# Patient Record
Sex: Female | Born: 1950 | Race: White | Hispanic: No | Marital: Married | Smoking: Never smoker
Health system: Southern US, Community
[De-identification: ages and names within clinical notes are randomized; demographics above are authoritative.]

## PROBLEM LIST (undated history)

## (undated) DIAGNOSIS — F988 Other specified behavioral and emotional disorders with onset usually occurring in childhood and adolescence: Secondary | ICD-10-CM

## (undated) DIAGNOSIS — A6920 Lyme disease, unspecified: Secondary | ICD-10-CM

## (undated) DIAGNOSIS — F329 Major depressive disorder, single episode, unspecified: Secondary | ICD-10-CM

## (undated) DIAGNOSIS — K219 Gastro-esophageal reflux disease without esophagitis: Secondary | ICD-10-CM

## (undated) DIAGNOSIS — T7840XA Allergy, unspecified, initial encounter: Secondary | ICD-10-CM

## (undated) DIAGNOSIS — F32A Depression, unspecified: Secondary | ICD-10-CM

## (undated) DIAGNOSIS — E213 Hyperparathyroidism, unspecified: Secondary | ICD-10-CM

## (undated) HISTORY — PX: EYE SURGERY: SHX253

## (undated) HISTORY — DX: Major depressive disorder, single episode, unspecified: F32.9

## (undated) HISTORY — PX: BREAST BIOPSY: SHX20

## (undated) HISTORY — DX: Gastro-esophageal reflux disease without esophagitis: K21.9

## (undated) HISTORY — DX: Allergy, unspecified, initial encounter: T78.40XA

## (undated) HISTORY — DX: Depression, unspecified: F32.A

## (undated) HISTORY — DX: Other specified behavioral and emotional disorders with onset usually occurring in childhood and adolescence: F98.8

## (undated) HISTORY — DX: Lyme disease, unspecified: A69.20

---

## 1976-11-10 HISTORY — PX: TUBAL LIGATION: SHX77

## 1978-11-10 HISTORY — PX: WISDOM TOOTH EXTRACTION: SHX21

## 2004-10-24 ENCOUNTER — Ambulatory Visit: Payer: Self-pay | Admitting: General Surgery

## 2005-03-06 ENCOUNTER — Ambulatory Visit: Payer: Self-pay

## 2005-11-20 ENCOUNTER — Ambulatory Visit: Payer: Self-pay | Admitting: Internal Medicine

## 2005-11-27 ENCOUNTER — Ambulatory Visit: Payer: Self-pay | Admitting: General Surgery

## 2006-09-16 ENCOUNTER — Ambulatory Visit: Payer: Self-pay | Admitting: Internal Medicine

## 2007-04-27 ENCOUNTER — Ambulatory Visit: Payer: Self-pay | Admitting: Unknown Physician Specialty

## 2007-05-27 ENCOUNTER — Ambulatory Visit: Payer: Self-pay | Admitting: Unknown Physician Specialty

## 2007-08-05 DIAGNOSIS — C4491 Basal cell carcinoma of skin, unspecified: Secondary | ICD-10-CM

## 2007-08-05 HISTORY — DX: Basal cell carcinoma of skin, unspecified: C44.91

## 2007-12-06 ENCOUNTER — Ambulatory Visit: Payer: Self-pay | Admitting: Internal Medicine

## 2008-12-12 ENCOUNTER — Ambulatory Visit: Payer: Self-pay | Admitting: Internal Medicine

## 2010-01-02 ENCOUNTER — Ambulatory Visit: Payer: Self-pay | Admitting: Internal Medicine

## 2010-11-28 ENCOUNTER — Ambulatory Visit: Payer: Self-pay | Admitting: Internal Medicine

## 2010-12-13 ENCOUNTER — Ambulatory Visit: Payer: Self-pay | Admitting: Internal Medicine

## 2011-01-06 ENCOUNTER — Ambulatory Visit: Payer: Self-pay | Admitting: Internal Medicine

## 2011-01-08 ENCOUNTER — Ambulatory Visit: Payer: Self-pay | Admitting: Internal Medicine

## 2011-01-21 ENCOUNTER — Other Ambulatory Visit: Payer: Self-pay | Admitting: General Surgery

## 2011-01-21 DIAGNOSIS — R921 Mammographic calcification found on diagnostic imaging of breast: Secondary | ICD-10-CM

## 2011-01-27 ENCOUNTER — Ambulatory Visit: Payer: Self-pay | Admitting: Surgery

## 2011-01-29 LAB — PATHOLOGY REPORT

## 2011-11-15 LAB — HM PAP SMEAR

## 2012-01-13 ENCOUNTER — Ambulatory Visit: Payer: Self-pay | Admitting: Internal Medicine

## 2012-01-16 ENCOUNTER — Ambulatory Visit: Payer: Self-pay | Admitting: Internal Medicine

## 2012-09-03 ENCOUNTER — Telehealth: Payer: Self-pay | Admitting: Internal Medicine

## 2012-09-03 NOTE — Telephone Encounter (Signed)
Dr. Lorin Picket called this into pharmacy with 2 refills.

## 2012-09-03 NOTE — Telephone Encounter (Signed)
Refill request for venlafaxine hcl er 37.5 mg cap Sig: take one capsule by mouth once a day Patient has an appointment on 12/16/11

## 2012-12-15 ENCOUNTER — Encounter: Payer: Self-pay | Admitting: Internal Medicine

## 2012-12-15 ENCOUNTER — Ambulatory Visit (INDEPENDENT_AMBULATORY_CARE_PROVIDER_SITE_OTHER): Admitting: Internal Medicine

## 2012-12-15 ENCOUNTER — Other Ambulatory Visit (HOSPITAL_COMMUNITY)
Admission: RE | Admit: 2012-12-15 | Discharge: 2012-12-15 | Disposition: A | Discharge status: Home / Self Care | Source: Ambulatory Visit | Attending: Internal Medicine | Admitting: Internal Medicine

## 2012-12-15 VITALS — BP 110/60 | HR 73 | Temp 98.2°F | Ht 67.5 in | Wt 164.5 lb

## 2012-12-15 DIAGNOSIS — G9389 Other specified disorders of brain: Secondary | ICD-10-CM

## 2012-12-15 DIAGNOSIS — F329 Major depressive disorder, single episode, unspecified: Secondary | ICD-10-CM

## 2012-12-15 DIAGNOSIS — Z139 Encounter for screening, unspecified: Secondary | ICD-10-CM

## 2012-12-15 DIAGNOSIS — Z1151 Encounter for screening for human papillomavirus (HPV): Secondary | ICD-10-CM | POA: Insufficient documentation

## 2012-12-15 DIAGNOSIS — E7800 Pure hypercholesterolemia, unspecified: Secondary | ICD-10-CM

## 2012-12-15 DIAGNOSIS — G4730 Sleep apnea, unspecified: Secondary | ICD-10-CM

## 2012-12-15 DIAGNOSIS — R319 Hematuria, unspecified: Secondary | ICD-10-CM

## 2012-12-15 DIAGNOSIS — G939 Disorder of brain, unspecified: Secondary | ICD-10-CM

## 2012-12-15 DIAGNOSIS — Z01419 Encounter for gynecological examination (general) (routine) without abnormal findings: Secondary | ICD-10-CM | POA: Insufficient documentation

## 2012-12-15 DIAGNOSIS — R5383 Other fatigue: Secondary | ICD-10-CM

## 2012-12-15 DIAGNOSIS — F320 Major depressive disorder, single episode, mild: Secondary | ICD-10-CM | POA: Insufficient documentation

## 2012-12-15 MED ORDER — OMEPRAZOLE 20 MG PO CPDR: 20.0000 mg | DELAYED_RELEASE_CAPSULE | Freq: Every day | ORAL | Status: DC

## 2012-12-15 MED ORDER — VENLAFAXINE HCL ER 37.5 MG PO CP24: 37.5000 mg | ORAL_CAPSULE | Freq: Every day | ORAL | Status: DC

## 2012-12-15 NOTE — Assessment & Plan Note (Signed)
Worked up by Dr Stoioff.  Per patient - everything checked out fine.  Last urinalysis - no rbc's (trace blood).  Follow.   

## 2012-12-15 NOTE — Assessment & Plan Note (Signed)
Back on effexor and doing well.  Follow.

## 2012-12-15 NOTE — Assessment & Plan Note (Signed)
Currently being treated.  Follow.   

## 2012-12-15 NOTE — Assessment & Plan Note (Signed)
Saw Dr Allan Friedman.  He felt the lesion was stable.  Felt like it was fat.  Currently asymptomatic.  

## 2012-12-15 NOTE — Progress Notes (Signed)
Subjective:    [name], female    DOB: [birth year], 62 y.o.   MRN: [id number]  HPI 62 year old female with past history of depression, adult ADHD and seasonal allergies who comes in today to follow up on these issues as well as for a complete physical exam.  She reports that several months ago, she developed increased joint pain and muscles felt weak.  She saw Dr Gavin Potters.  On given pain medication - which she took regularly at first.  Then she just took the medication as needed.  Now joint pain has resolved.  No muscle weakness.  She feels more like her old self.  She had been off her Effexor.  Has now restarted and feels much better mentally.  She also has been seeing dermatology for her scalp.  States she was diagnosed with psoriasis.  Better now.  Eating and drinking well.    Past Medical History  Diagnosis Date  . Depression   . Allergy   . GERD (gastroesophageal reflux disease)   . Lyme disease     history  . ADD (attention deficit disorder)   . Arthritis     Current Outpatient Prescriptions on File Prior to Visit  Medication Sig Dispense Refill  . omeprazole (PRILOSEC) 20 MG capsule Take 1 capsule (20 mg total) by mouth daily.  30 capsule  5  . venlafaxine XR (EFFEXOR-XR) 37.5 MG 24 hr capsule Take 1 capsule (37.5 mg total) by mouth daily.  30 capsule  5    Review of Systems Patient denies any headache, lightheadedness or dizziness.  No significant sinus or allergy symptoms.  No chest pain, tightness or palpitations.  No increased shortness of breath, cough or congestion.  No nausea or vomiting. No acid reflux.  On omeprazole.   No abdominal pain or cramping.  No bowel change, such as diarrhea, constipation, BRBPR or melana.  No urine change.  Some fatigue, but overall feels better being back on her Effexor.  She denies noticing any change in her breast.      Objective:   Physical Exam Filed Vitals:   12/15/12 1004  BP: 110/60  Pulse: 73  Temp: 98.2 F (12.72  C)   62 year old female in no acute distress.   HEENT:  Nares- clear.  Oropharynx - without lesions. NECK:  Supple.  Nontender.  No audible bruit.  HEART:  Appears to be regular. LUNGS:  No crackles or wheezing audible.  Respirations even and unlabored.  RADIAL PULSE:  Equal bilaterally.    BREASTS:  No nipple discharge or nipple retraction present.  Could not appreciate any distinct nodules or axillary adenopathy.  ABDOMEN:  Soft, nontender.  Bowel sounds present and normal.  No audible abdominal bruit.  GU:  Normal external genitalia.  Vaginal vault without lesions.  Cervix identified.  Pap performed. Could not appreciate any adnexal masses or tenderness.   RECTAL:  Heme negative.   EXTREMITIES:  No increased edema present.  DP pulses palpable and equal bilaterally.          Assessment & Plan:  PREVIOUS ABNORMAL MAMMOGRAM.  Saw Dr Katrinka Blazing.  Had breast biopsy - fibroadenoma.  Last mammogram 3/13 - recommended additional views.  Final reading - BiRADS II.  Schedule a follow up breast exam.    CARDIOVASCULAR.  ECHO 06/2008 revealed normal LV function with EF 55%.  No significant valvular abnormality.  Asymptomatic.    HISTORY OF OVARIAN CYST.  Evaluated by Dr Luella Cook and released.  MSK.  No joint pains currently.   Saw Dr Gavin Potters.  Follow.    FATIGUE.  Check cbc, met c and ts.   HEALTH MAINTENANCE.  Physical today.  Schedule mammogram.  Pap today.  Colonoscopy 02/2005 - normal.  Recommended follow up colonoscopy 2016.  IFOB today.

## 2012-12-21 ENCOUNTER — Encounter: Payer: Self-pay | Admitting: *Deleted

## 2012-12-30 ENCOUNTER — Encounter: Payer: Self-pay | Admitting: Internal Medicine

## 2013-01-06 ENCOUNTER — Telehealth: Payer: Self-pay | Admitting: Internal Medicine

## 2013-01-06 MED ORDER — OMEPRAZOLE 20 MG PO CPDR: 20.0000 mg | DELAYED_RELEASE_CAPSULE | Freq: Every day | ORAL | Status: DC

## 2013-01-06 MED ORDER — VENLAFAXINE HCL ER 37.5 MG PO CP24: 37.5000 mg | ORAL_CAPSULE | Freq: Every day | ORAL | Status: DC

## 2013-01-06 NOTE — Telephone Encounter (Signed)
The meds by mail in our system does not have the address of [place].  I will print rx and she can send in.  If any problems let me know,

## 2013-01-07 MED ORDER — OMEPRAZOLE 20 MG PO CPDR: 20.0000 mg | DELAYED_RELEASE_CAPSULE | Freq: Every day | ORAL | Status: DC

## 2013-01-07 MED ORDER — VENLAFAXINE HCL ER 37.5 MG PO CP24: 37.5000 mg | ORAL_CAPSULE | Freq: Every day | ORAL | Status: DC

## 2013-01-07 NOTE — Telephone Encounter (Signed)
Sent in to pharmacy. Checked with patient this is the right pharmacy.

## 2013-01-07 NOTE — Telephone Encounter (Signed)
Patient calling waiting on her prescription . Do you have the prescription ready for the patient or will you fax them to her mail order?

## 2013-01-25 ENCOUNTER — Ambulatory Visit: Payer: Self-pay | Admitting: Internal Medicine

## 2013-01-29 ENCOUNTER — Encounter: Payer: Self-pay | Admitting: Internal Medicine

## 2013-02-15 ENCOUNTER — Encounter: Payer: Self-pay | Admitting: Internal Medicine

## 2013-06-15 ENCOUNTER — Ambulatory Visit: Admitting: Internal Medicine

## 2013-07-01 ENCOUNTER — Ambulatory Visit: Admitting: Internal Medicine

## 2013-07-25 ENCOUNTER — Encounter: Payer: Self-pay | Admitting: Adult Health

## 2013-07-25 ENCOUNTER — Ambulatory Visit (INDEPENDENT_AMBULATORY_CARE_PROVIDER_SITE_OTHER): Admitting: Adult Health

## 2013-07-25 VITALS — BP 102/62 | HR 59 | Temp 98.0°F | Resp 12 | Ht 67.5 in | Wt 164.0 lb

## 2013-07-25 DIAGNOSIS — M25511 Pain in right shoulder: Secondary | ICD-10-CM

## 2013-07-25 DIAGNOSIS — N644 Mastodynia: Secondary | ICD-10-CM | POA: Insufficient documentation

## 2013-07-25 DIAGNOSIS — M25519 Pain in unspecified shoulder: Secondary | ICD-10-CM

## 2013-07-25 MED ORDER — NAPROXEN 500 MG PO TABS: 500.0000 mg | ORAL_TABLET | Freq: Two times a day (BID) | ORAL | Status: DC

## 2013-07-25 NOTE — Patient Instructions (Addendum)
  Start Naproxen 500 mg twice a day with meals.  Ice for 15 minutes and do this 3-4 times daily.  You can try aspercream to see if this help with the pain.  Immobilize with a sling. You can get one at [facility] that is inexpensive.   I am referring you to an orthopedic for further evaluation of your shoulder.

## 2013-07-25 NOTE — Progress Notes (Signed)
  Subjective:    [name], female    DOB: 1951-03-27, 62 y.o.   MRN: [id number]  HPI  Patient is a pleasant 62 year old female who presents to clinic with complaints of Right shoulder soreness. Symptoms began after using the trimming scissors in her yard during the spring. She reports pain ongoing for 6 months. She has tried advil as needed, massage. She has not been consistent with any NSAIDs. She has not iced the area or immobilized it. Pain is worse with abduction of the arm. Occasionally feels pain down her bicep into elbow.    Current Outpatient Prescriptions on File Prior to Visit  Medication Sig Dispense Refill  . omeprazole (PRILOSEC) 20 MG capsule Take 1 capsule (20 mg total) by mouth daily.  30 capsule  5  . venlafaxine XR (EFFEXOR-XR) 37.5 MG 24 hr capsule Take 1 capsule (37.5 mg total) by mouth daily.  30 capsule  5   No current facility-administered medications on file prior to visit.    Review of Systems  Respiratory: Negative.   Cardiovascular: Negative.   Musculoskeletal:       Right shoulder pain especially with raising arm to the side. Radiates to her elbow  Psychiatric/Behavioral: Negative.       BP 102/62  Pulse 59  Temp(Src) 98 F (36.7 C) (Oral)  Resp 12  Ht 5' 7.5" (1.715 m)  Wt 164 lb (74.39 kg)  BMI 25.29 kg/m2  SpO2 98%  LMP 07/16/1995     Objective:   Physical Exam  Constitutional: She is oriented to person, place, and time.  Cardiovascular: Normal rate and regular rhythm.   Pulmonary/Chest: Effort normal. No respiratory distress.  Musculoskeletal:  ROM decreased. Pain with abduction at ~ 20-30 degree. Pain radiates to elbow with abduction.  Neurological: She is alert and oriented to person, place, and time.  Skin: Skin is warm and dry.  Psychiatric: She has a normal mood and affect. Her behavior is normal. Judgment and thought content normal.          Assessment & Plan:

## 2013-07-25 NOTE — Assessment & Plan Note (Addendum)
Shoulder pain with radiating to elbow with abduction of arms. Suspect tendonitis. Start Naproxen. Ice area for 15 min.  Immobilize with sling. Aspercream to see if helps with discomfort. Will have ortho see her to evaluate if steroid injection may benefit.

## 2013-07-27 ENCOUNTER — Ambulatory Visit (INDEPENDENT_AMBULATORY_CARE_PROVIDER_SITE_OTHER): Admitting: Family Medicine

## 2013-07-27 ENCOUNTER — Encounter: Payer: Self-pay | Admitting: Family Medicine

## 2013-07-27 VITALS — BP 100/60 | HR 69 | Temp 98.2°F | Ht 67.5 in | Wt 166.5 lb

## 2013-07-27 DIAGNOSIS — M25519 Pain in unspecified shoulder: Secondary | ICD-10-CM

## 2013-07-27 DIAGNOSIS — M25511 Pain in right shoulder: Secondary | ICD-10-CM

## 2013-07-27 DIAGNOSIS — M7501 Adhesive capsulitis of right shoulder: Secondary | ICD-10-CM

## 2013-07-27 DIAGNOSIS — M7500 Adhesive capsulitis of unspecified shoulder: Secondary | ICD-10-CM

## 2013-07-27 NOTE — Progress Notes (Signed)
Date:  07/27/2013   Name:  Ann Osborne   DOB:  June 05, 1951   MRN:  [id number] Gender: female Age: 62 y.o.  Primary Physician: Charm Barges, MD Referring Provider: Ms. Hassie Bruce  Dear Ms. Rey,  Thank you for having me see Ann Osborne in consultation today at [employer] [facility] at [facility] for her problem with RIGHT sided shoulder pain.  As you may recall, she is a 62 y.o. year old female with a 6 month history of R sided shoulder pain after she was trimming bushes at home. At that time, she was trimming bushes overhead and has had some pain ever since.   She has intermittently been taking some Alleve for the pain. No significant history of R shoulder injury in the past, fractures, neck injuries, or prior surgery.  She now has a dull ache in the shoulder and in a T-shirt distribution with limitation in her motion.    Past Medical History  Diagnosis Date  . Depression   . Allergy   . GERD (gastroesophageal reflux disease)   . Lyme disease     history  . ADD (attention deficit disorder)   . Arthritis     Past Surgical History  Procedure Laterality Date  . Tubal ligation  1978    bilateral  . Wisdom tooth extraction  1980  . Breast biopsy  20 yrs ago    History   Social History  . Marital Status: Unknown    Spouse Name: N/A    Number of Children: 2  . Years of Education: N/A   Social History Main Topics  . Smoking status: Never Smoker   . Smokeless tobacco: Never Used  . Alcohol Use: Yes     Comment: occasional  . Drug Use: No  . Sexual Activity: None   Other Topics Concern  . None   Social History Narrative  . None    Family History  Problem Relation Age of Onset  . Uterine cancer Mother   . Heart disease Maternal Grandmother   . Alcohol abuse Maternal Grandfather   . Parkinson's disease Paternal Grandmother   . Depression Paternal Grandfather     Medications and Allergies reviewed  Outpatient Prescriptions Prior to Visit  Medication Sig  Dispense Refill  . naproxen (NAPROSYN) 500 MG tablet Take 1 tablet (500 mg total) by mouth 2 (two) times daily with a meal.  30 tablet  1  . omeprazole (PRILOSEC) 20 MG capsule Take 1 capsule (20 mg total) by mouth daily.  30 capsule  5  . venlafaxine XR (EFFEXOR-XR) 37.5 MG 24 hr capsule Take 1 capsule (37.5 mg total) by mouth daily.  30 capsule  5   No facility-administered medications prior to visit.    Review of Systems:    GEN: No fevers, chills. Nontoxic. Primarily MSK c/o today. MSK: Detailed in the HPI GI: tolerating PO intake without difficulty Neuro: No numbness, parasthesias, or tingling associated. Otherwise the pertinent positives of the ROS are noted above.    Physical Examination: Filed Vitals:   07/27/13 1020  BP: 100/60  Pulse: 69  Temp: 98.2 F (36.8 C)  TempSrc: Oral  Height: 5' 7.5" (1.715 m)  Weight: 166 lb 8 oz (75.524 kg)      GEN: Well-developed,well-nourished,in no acute distress; alert,appropriate and cooperative throughout examination HEENT: Normocephalic and atraumatic without obvious abnormalities. Ears, externally no deformities PULM: Breathing comfortably in no respiratory distress EXT: No clubbing, cyanosis, or edema PSYCH: Normally interactive. Cooperative during the interview.  Pleasant. Friendly and conversant. Not anxious or depressed appearing. Normal, full affect.  R shoulder: mild tenderness at [facility] joint. NT along clavicle. Mildly tender in the bicipital groove. Speeds and Yergason's are not particularly bothersome.   Abduction is limited to 150 degrees.  Flexion limited to 160 degrees. In 90 degrees of abduction, relative lack of 70 deg of IROM compared to contralateral side In 90 deg abd, EROM with relative loss of 15 deg compared to contralateral side.   str 4++/5 in abd All others 5/5  Other special testing equivocal with restricted motion  C5-T1 intact  Neuro: Sensation intact Grip 5/5   Assessment and Plan:  Impression:  1. 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 Most likely with partial thickness RTC injury 6 months ago. Strength and mechanics are fairly good, full-thickness RTC tear unlikely but could not be completely excluded.   Recommendations: We discussed various treatment options, and I think most of her pain from frozen shoulder. Begin aggressive PT and HEP for frozen shoulder.   Orders Placed This Encounter  Procedures  . Ambulatory referral to Physical Therapy    Referral Priority:  Routine    Referral Type:  Physical Medicine    Referral Reason:  Specialty Services Required    Requested Specialty:  Physical Therapy    Number of Visits Requested:  1   Intrarticular Shoulder Injection, RIGHT Verbal consent was obtained from the patient. Risks including infection explained and contrasted with benefits and alternatives. Patient prepped with Chloraprep and Ethyl Chloride used for anesthesia. An intraarticular shoulder injection was performed using the posterior approach in combination with subac inj with 50% of volume placed in each space. The patient tolerated the procedure well and had decreased pain post injection. No complications. Injection: 8 cc of Lidocaine 1% and 2 cc of Depo-Medrol 40 mg. Needle: 22 gauge   We will see the patient back in 6 weeks.  Thank you for having [place] see Ann Osborne in consultation.  Feel free to contact me with any questions.  Signed, Elpidio Galea. Annahi Short, MD, CAQ Sports Medicine [employer] at [facility] [address] [place], [place] [address] Phone: [phone] Fax: [phone]

## 2013-07-27 NOTE — Patient Instructions (Addendum)
REFERRAL: GO THE THE FRONT ROOM AT THE ENTRANCE OF OUR CLINIC, NEAR CHECK IN. ASK FOR MARION. SHE WILL HELP YOU SET UP YOUR REFERRAL. DATE: TIME:   F/u 6 weeks

## 2013-08-09 ENCOUNTER — Other Ambulatory Visit (INDEPENDENT_AMBULATORY_CARE_PROVIDER_SITE_OTHER)

## 2013-08-09 DIAGNOSIS — Z1211 Encounter for screening for malignant neoplasm of colon: Secondary | ICD-10-CM

## 2013-08-09 DIAGNOSIS — Z139 Encounter for screening, unspecified: Secondary | ICD-10-CM

## 2013-08-11 ENCOUNTER — Encounter: Payer: Self-pay | Admitting: Internal Medicine

## 2013-08-11 ENCOUNTER — Ambulatory Visit: Admitting: Internal Medicine

## 2013-08-25 ENCOUNTER — Ambulatory Visit (INDEPENDENT_AMBULATORY_CARE_PROVIDER_SITE_OTHER): Admitting: Internal Medicine

## 2013-08-25 ENCOUNTER — Encounter: Payer: Self-pay | Admitting: Internal Medicine

## 2013-08-25 VITALS — BP 110/60 | HR 75 | Temp 97.7°F | Wt 163.5 lb

## 2013-08-25 DIAGNOSIS — M25519 Pain in unspecified shoulder: Secondary | ICD-10-CM

## 2013-08-25 DIAGNOSIS — G939 Disorder of brain, unspecified: Secondary | ICD-10-CM

## 2013-08-25 DIAGNOSIS — G9389 Other specified disorders of brain: Secondary | ICD-10-CM

## 2013-08-25 DIAGNOSIS — G4730 Sleep apnea, unspecified: Secondary | ICD-10-CM

## 2013-08-25 DIAGNOSIS — M25511 Pain in right shoulder: Secondary | ICD-10-CM

## 2013-08-25 DIAGNOSIS — Z9109 Other allergy status, other than to drugs and biological substances: Secondary | ICD-10-CM

## 2013-08-25 DIAGNOSIS — Z23 Encounter for immunization: Secondary | ICD-10-CM

## 2013-08-25 DIAGNOSIS — F329 Major depressive disorder, single episode, unspecified: Secondary | ICD-10-CM

## 2013-08-25 DIAGNOSIS — R319 Hematuria, unspecified: Secondary | ICD-10-CM

## 2013-08-25 MED ORDER — FLUTICASONE PROPIONATE 50 MCG/ACT NA SUSP: 2.0000 | Freq: Every day | NASAL | Status: DC

## 2013-08-25 MED ORDER — OMEPRAZOLE 20 MG PO CPDR: 20.0000 mg | DELAYED_RELEASE_CAPSULE | Freq: Every day | ORAL | Status: DC

## 2013-08-25 MED ORDER — VENLAFAXINE HCL ER 37.5 MG PO CP24: 37.5000 mg | ORAL_CAPSULE | Freq: Every day | ORAL | Status: DC

## 2013-08-25 NOTE — Progress Notes (Signed)
  Subjective:    [name], female    DOB: 07-20-1951, 62 y.o.   MRN: [id number]  HPI 62 year old female with past history of depression, adult ADHD and seasonal allergies who comes in today for a scheduled follow up.  She previously stopped her Effexor.  Did not feel well off.  Back on now and feels good.  She also has been seeing dermatology for her scalp.  States she was diagnosed with psoriasis.  Better now.  Eating and drinking well.  Is s/p right shoulder injection.  Helped.  States she is having constant drainage and cough. No fever.  Request a referral to an allergist.     Past Medical History  Diagnosis Date  . Depression   . Allergy   . GERD (gastroesophageal reflux disease)   . Lyme disease     history  . ADD (attention deficit disorder)   . Arthritis     Current Outpatient Prescriptions on File Prior to Visit  Medication Sig Dispense Refill  . omeprazole (PRILOSEC) 20 MG capsule Take 1 capsule (20 mg total) by mouth daily.  30 capsule  5  . venlafaxine XR (EFFEXOR-XR) 37.5 MG 24 hr capsule Take 1 capsule (37.5 mg total) by mouth daily.  30 capsule  5   No current facility-administered medications on file prior to visit.    Review of Systems Patient denies any headache, lightheadedness or dizziness.  Persistent drainage and cough as outlined.  No chest pain, tightness or palpitations.  No increased shortness of breath or congestion.  No nausea or vomiting. No acid reflux.  On omeprazole.   No abdominal pain or cramping.  No bowel change, such as diarrhea, constipation, BRBPR or melana.  No urine change.  Feels good.  Taking her effexor.  In a new relationship.      Objective:   Physical Exam  Filed Vitals:   08/25/13 1143  BP: 110/60  Pulse: 75  Temp: 97.7 F (69.73 C)   62 year old female in no acute distress.   HEENT:  Nares- clear.  Oropharynx - without lesions. NECK:  Supple.  Nontender.  No audible bruit.  HEART:  Appears to be regular. LUNGS:   No crackles or wheezing audible.  Respirations even and unlabored.  RADIAL PULSE:  Equal bilaterally.    ABDOMEN:  Soft, nontender.  Bowel sounds present and normal.  No audible abdominal bruit.   EXTREMITIES:  No increased edema present.  DP pulses palpable and equal bilaterally.          Assessment & Plan:  PREVIOUS ABNORMAL MAMMOGRAM.  Saw Dr Katrinka Blazing.  Had breast biopsy - fibroadenoma.  Mammogram 3/13 - recommended additional views.  Final reading - BiRADS II.  01/25/13 mammogram - Birads I.     CARDIOVASCULAR.  ECHO 06/2008 revealed normal LV function with EF 55%.  No significant valvular abnormality.  Asymptomatic.    HISTORY OF OVARIAN CYST.  Evaluated by Dr Luella Cook and released.    MSK.  No joint pains currently.   Saw Dr Gavin Potters.  S/p shoulder injection.  Helped.      HEALTH MAINTENANCE.  Physical 12/15/12.  Mammogram 01/25/13 - birads I.   Pap at physical negative with negative HPV.  Colonoscopy 02/2005 - normal.  Recommended follow up colonoscopy 2016.   IFOB negative 08/09/13.

## 2013-08-29 ENCOUNTER — Encounter: Payer: Self-pay | Admitting: Internal Medicine

## 2013-08-29 NOTE — Assessment & Plan Note (Signed)
Currently being treated.  Follow.   

## 2013-08-29 NOTE — Assessment & Plan Note (Signed)
Back on effexor and doing well.  Follow.

## 2013-08-29 NOTE — Assessment & Plan Note (Signed)
S/p injection.  Doing well.  Follow.   

## 2013-08-29 NOTE — Assessment & Plan Note (Signed)
Saw Dr Allan Friedman.  He felt the lesion was stable.  Felt like it was fat.  Currently asymptomatic.  

## 2013-08-29 NOTE — Assessment & Plan Note (Signed)
Worked up by Dr Stoioff.  Per patient - everything checked out fine.  Last urinalysis - no rbc's (trace blood).  Follow.   

## 2013-09-05 ENCOUNTER — Other Ambulatory Visit

## 2013-09-07 ENCOUNTER — Ambulatory Visit: Admitting: Family Medicine

## 2013-09-08 ENCOUNTER — Other Ambulatory Visit

## 2013-09-13 ENCOUNTER — Other Ambulatory Visit (INDEPENDENT_AMBULATORY_CARE_PROVIDER_SITE_OTHER)

## 2013-09-13 DIAGNOSIS — R5383 Other fatigue: Secondary | ICD-10-CM

## 2013-09-13 DIAGNOSIS — R5381 Other malaise: Secondary | ICD-10-CM

## 2013-09-13 DIAGNOSIS — E7800 Pure hypercholesterolemia, unspecified: Secondary | ICD-10-CM

## 2013-09-13 LAB — CBC WITH DIFFERENTIAL/PLATELET
Basophils Absolute: 0 10*3/uL (ref 0.0–0.1)
Eosinophils Absolute: 0 10*3/uL (ref 0.0–0.7)
Eosinophils Relative: 1 % (ref 0.0–5.0)
Lymphocytes Relative: 27.4 % (ref 12.0–46.0)
Lymphs Abs: 1.3 10*3/uL (ref 0.7–4.0)
MCHC: 34.6 g/dL (ref 30.0–36.0)
MCV: 91.3 fl (ref 78.0–100.0)
Monocytes Absolute: 0.5 10*3/uL (ref 0.1–1.0)
Neutrophils Relative %: 61 % (ref 43.0–77.0)
Platelets: 267 10*3/uL (ref 150.0–400.0)
RBC: 4.09 Mil/uL (ref 3.87–5.11)
RDW: 13 % (ref 11.5–14.6)

## 2013-09-13 LAB — LIPID PANEL
Cholesterol: 225 mg/dL — ABNORMAL HIGH (ref 0–200)
Total CHOL/HDL Ratio: 5
VLDL: 15 mg/dL (ref 0.0–40.0)

## 2013-09-13 LAB — COMPREHENSIVE METABOLIC PANEL
ALT: 25 U/L (ref 0–35)
AST: 26 U/L (ref 0–37)
Albumin: 4.3 g/dL (ref 3.5–5.2)
Alkaline Phosphatase: 60 U/L (ref 39–117)
Calcium: 10.3 mg/dL (ref 8.4–10.5)
Chloride: 104 mEq/L (ref 96–112)
Creatinine, Ser: 0.8 mg/dL (ref 0.4–1.2)
Potassium: 4.3 mEq/L (ref 3.5–5.1)
Sodium: 137 mEq/L (ref 135–145)
Total Bilirubin: 0.8 mg/dL (ref 0.3–1.2)

## 2013-09-13 LAB — LDL CHOLESTEROL, DIRECT: Direct LDL: 163.5 mg/dL

## 2013-09-14 ENCOUNTER — Encounter: Payer: Self-pay | Admitting: Internal Medicine

## 2013-09-22 ENCOUNTER — Ambulatory Visit (INDEPENDENT_AMBULATORY_CARE_PROVIDER_SITE_OTHER): Admitting: Family Medicine

## 2013-09-22 ENCOUNTER — Encounter: Payer: Self-pay | Admitting: Family Medicine

## 2013-09-22 VITALS — BP 100/64 | HR 61 | Temp 97.8°F | Ht 67.5 in | Wt 164.5 lb

## 2013-09-22 DIAGNOSIS — M7501 Adhesive capsulitis of right shoulder: Secondary | ICD-10-CM

## 2013-09-22 DIAGNOSIS — M7500 Adhesive capsulitis of unspecified shoulder: Secondary | ICD-10-CM

## 2013-09-22 NOTE — Progress Notes (Signed)
Date:  09/22/2013   Name:  Ann Osborne   DOB:  Oct 06, 1951   MRN:  [id number] Gender: female Age: 62 y.o.  Primary Physician:  Charm Barges, MD   Chief Complaint: Follow-up   Subjective:   History of Present Illness:  Ann Osborne is a 62 y.o. pleasant patient who presents with the following:  Followup right frozen shoulder. The patient is doing quite well.   07/27/2013, I evaluated the patient, and thought that she had a right-sided frozen shoulder with significant limitations in multiple planes of movement. We did an intra-articular injection, and she has been doing physical therapy and has been quite diligent with her home exercise program. Now her range of motion has returned to full, she has no pain, and her strength is excellent.   Patient Active Problem List   Diagnosis Date Noted  . Right shoulder pain 07/25/2013  . Sleep apnea 12/15/2012  . Depression 12/15/2012  . 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 Hematuria 12/15/2012    Past Medical History  Diagnosis Date  . Depression   . Allergy   . GERD (gastroesophageal reflux disease)   . Lyme disease     history  . ADD (attention deficit disorder)   . Arthritis     Past Surgical History  Procedure Laterality Date  . Tubal ligation  1978    bilateral  . Wisdom tooth extraction  1980  . Breast biopsy  20 yrs ago    History   Social History  . Marital Status: Unknown    Spouse Name: N/A    Number of Children: 2  . Years of Education: N/A   Occupational History  . Not on file.   Social History Main Topics  . Smoking status: Never Smoker   . Smokeless tobacco: Never Used  . Alcohol Use: Yes     Comment: occasional  . Drug Use: No  . Sexual Activity: Not on file   Other Topics Concern  . Not on file   Social History Narrative  . No narrative on file    Family History  Problem Relation Age of Onset  . Uterine cancer Mother   . Heart disease Maternal Grandmother   . Alcohol abuse Maternal  Grandfather   . Parkinson's disease Paternal Grandmother   . Depression Paternal Grandfather     Allergies  Allergen Reactions  . Compazine [Prochlorperazine Edisylate]   . Sulfa Antibiotics     Medication list has been reviewed and updated.  Review of Systems:   GEN: No fevers, chills. Nontoxic. Primarily MSK c/o today. MSK: Detailed in the HPI GI: tolerating PO intake without difficulty Neuro: No numbness, parasthesias, or tingling associated. Otherwise the pertinent positives of the ROS are noted above.   Objective:   Physical Examination: BP 100/64  Pulse 61  Temp(Src) 97.8 F (36.6 C) (Oral)  Ht 5' 7.5" (1.715 m)  Wt 164 lb 8 oz (74.617 kg)  BMI 25.37 kg/m2  LMP 07/16/1995  Ideal Body Weight: Weight in (lb) to have BMI = 25: 161.7   GEN: WDWN, NAD, Non-toxic, Alert & Oriented x 3 HEENT: Atraumatic, Normocephalic.  Ears and Nose: No external deformity. EXTR: No clubbing/cyanosis/edema NEURO: Normal gait.  PSYCH: Normally interactive. Conversant. Not depressed or anxious appearing.  Calm demeanor.   Shoulder: R Inspection: No muscle wasting or winging Ecchymosis/edema: neg  AC joint, scapula, clavicle: NT Cervical spine: NT, full ROM Spurling's: neg Abduction: full, 5/5 Flexion: full, 5/5 IR, full, lift-off: 5/5 ER at neutral:  full, 5/5 AC crossover and compression: neg Neer: neg Hawkins: neg Drop Test: neg Empty Can: neg Supraspinatus insertion: NT Bicipital groove: NT Speed's: neg Yergason's: neg Sulcus sign: neg Scapular dyskinesis: none C5-T1 intact Sensation intact Grip 5/5   No results found.  Assessment & Plan:    Frozen shoulder syndrome, right Dramatically improved, followup when necessary. Continue with home exercise program for maintenance 2 times weekly.  Signed,  Elpidio Galea. Leiah Giannotti, MD, CAQ Sports Medicine  [facility] [address] [place] [address] Phone: [phone] Fax:  [phone]  Updated Complete Medication List:   Medication List       This list is accurate as of: 09/22/13 11:32 AM.  Always use your most recent med list.               fluticasone 50 MCG/ACT nasal spray  Commonly known as:  FLONASE  Place 2 sprays into the nose daily.     omeprazole 20 MG capsule  Commonly known as:  PRILOSEC  Take 1 capsule (20 mg total) by mouth daily.     venlafaxine XR 37.5 MG 24 hr capsule  Commonly known as:  EFFEXOR-XR  Take 1 capsule (37.5 mg total) by mouth daily.

## 2013-09-22 NOTE — Progress Notes (Signed)
Pre-visit discussion using our clinic review tool. No additional management support is needed unless otherwise documented below in the visit note.  

## 2013-12-27 ENCOUNTER — Encounter: Admitting: Internal Medicine

## 2014-01-02 ENCOUNTER — Telehealth: Payer: Self-pay | Admitting: Internal Medicine

## 2014-01-02 NOTE — Telephone Encounter (Signed)
Pt left vm to rs appt 2/17 from weather/snow for CPE.  Please advise where to schedule this pt for CPE.

## 2014-01-03 NOTE — Telephone Encounter (Signed)
You can put her in 01/30/14 at 11:45 and block the 10:15 (can make 30 minutes for [name]).  Thanks.

## 2014-01-04 NOTE — Telephone Encounter (Signed)
Sent my chart message letting pt know about appointment date and time

## 2014-01-26 DIAGNOSIS — L299 Pruritus, unspecified: Secondary | ICD-10-CM | POA: Diagnosis not present

## 2014-01-26 DIAGNOSIS — R21 Rash and other nonspecific skin eruption: Secondary | ICD-10-CM | POA: Diagnosis not present

## 2014-01-30 ENCOUNTER — Encounter: Payer: Self-pay | Admitting: Internal Medicine

## 2014-01-30 ENCOUNTER — Ambulatory Visit (INDEPENDENT_AMBULATORY_CARE_PROVIDER_SITE_OTHER): Payer: Medicare Other | Admitting: Internal Medicine

## 2014-01-30 VITALS — BP 110/70 | HR 59 | Temp 97.7°F | Ht 67.5 in | Wt 180.5 lb

## 2014-01-30 DIAGNOSIS — G939 Disorder of brain, unspecified: Secondary | ICD-10-CM

## 2014-01-30 DIAGNOSIS — F32A Depression, unspecified: Secondary | ICD-10-CM

## 2014-01-30 DIAGNOSIS — N644 Mastodynia: Secondary | ICD-10-CM

## 2014-01-30 DIAGNOSIS — F3289 Other specified depressive episodes: Secondary | ICD-10-CM | POA: Diagnosis not present

## 2014-01-30 DIAGNOSIS — F329 Major depressive disorder, single episode, unspecified: Secondary | ICD-10-CM

## 2014-01-30 DIAGNOSIS — G4730 Sleep apnea, unspecified: Secondary | ICD-10-CM | POA: Diagnosis not present

## 2014-01-30 DIAGNOSIS — R21 Rash and other nonspecific skin eruption: Secondary | ICD-10-CM

## 2014-01-30 DIAGNOSIS — G9389 Other specified disorders of brain: Secondary | ICD-10-CM

## 2014-01-30 DIAGNOSIS — R319 Hematuria, unspecified: Secondary | ICD-10-CM | POA: Diagnosis not present

## 2014-01-30 DIAGNOSIS — E7800 Pure hypercholesterolemia, unspecified: Secondary | ICD-10-CM

## 2014-01-30 DIAGNOSIS — B351 Tinea unguium: Secondary | ICD-10-CM

## 2014-01-30 MED ORDER — OMEPRAZOLE 20 MG PO CPDR: 20.0000 mg | DELAYED_RELEASE_CAPSULE | Freq: Every day | ORAL | Status: DC

## 2014-01-30 MED ORDER — TERBINAFINE HCL 250 MG PO TABS: 250.0000 mg | ORAL_TABLET | Freq: Every day | ORAL | Status: DC

## 2014-01-30 MED ORDER — VENLAFAXINE HCL ER 37.5 MG PO CP24: 37.5000 mg | ORAL_CAPSULE | Freq: Every day | ORAL | Status: DC

## 2014-01-30 NOTE — Progress Notes (Signed)
Subjective:    [name], female    DOB: November 09, 1951, 63 y.o.   MRN: [id number]  HPI 63 year old female with past history of depression, adult ADHD and seasonal allergies who comes in today to follow up on these issues as well as for a complete physical exam.   She previously stopped her Effexor.  Did not feel well off.  Back on now and feels good.  Since her last visit here, she got married.  Doing well.  Happy.  Eating and drinking well.  Not watching her diet.  Plans to start.  Has seen the allergist.  Allergic to cats, dogs and dust.  Overall controlled.  She does report that at the end of last week, she developed a rash on her central/mid back.  Went to a [employer].  Was given hydrocortisone cream.  Also taking an antihistamine.  May be some better, but still persistent.  No big change.  No other rash noted.  No fever or chills.  No nausea or vomiting.  Bowels stable.  A little hard, but no constipation.  Discussed using benefiber.  She also reports having a toenail fungus.  Took lamisil approximately 10 years ago.  Wants to take again.  Tolerated.      Past Medical History  Diagnosis Date  . Depression   . Allergy   . GERD (gastroesophageal reflux disease)   . Lyme disease     history  . ADD (attention deficit disorder)   . Arthritis     Current Outpatient Prescriptions on File Prior to Visit  Medication Sig Dispense Refill  . omeprazole (PRILOSEC) 20 MG capsule Take 1 capsule (20 mg total) by mouth daily.  90 capsule  1  . venlafaxine XR (EFFEXOR-XR) 37.5 MG 24 hr capsule Take 1 capsule (37.5 mg total) by mouth daily.  90 capsule  1   No current facility-administered medications on file prior to visit.    Review of Systems Patient denies any headache, lightheadedness or dizziness.  No significant drainage or cough reported.   No chest pain, tightness or palpitations.  No increased shortness of breath or congestion.  No nausea or vomiting.  No acid reflux.  On omeprazole.   Controls.   No abdominal pain or cramping.  No bowel change, such as diarrhea, significant constipation, BRBPR or melana.  Stool a little hard.  See above.  No urine change.  Feels good.  Taking her effexor.  Just got remarried. Happy.  Rash as outlined.       Objective:   Physical Exam  Filed Vitals:   01/30/14 1152  BP: 110/70  Pulse: 59  Temp: 97.7 F (25.83 C)   63 year old female in no acute distress.   HEENT:  Nares- clear.  Oropharynx - without lesions. NECK:  Supple.  Nontender.  No audible bruit.  HEART:  Appears to be regular. LUNGS:  No crackles or wheezing audible.  Respirations even and unlabored.  RADIAL PULSE:  Equal bilaterally.    BREASTS:  No nipple discharge or nipple retraction present.  Could not appreciate any distinct nodules or axillary adenopathy.  Tenderness to palpation over the 5:00 region left breast.   ABDOMEN:  Soft, nontender.  Bowel sounds present and normal.  No audible abdominal bruit.  GU:  Not performed.     EXTREMITIES:  No increased edema present.  DP pulses palpable and equal bilaterally.      SKIN:  Erythematous based rash/lesions over the mid/center back.  Raised.  No other rash.  Non tender.      Assessment & Plan:  PREVIOUS ABNORMAL MAMMOGRAM.  Saw Dr Tamala Julian.  Had breast biopsy - fibroadenoma.  Mammogram 3/13 - recommended additional views.  Final reading - BiRADS II.  01/25/13 mammogram - Birads I.   Due f/u mammogram.  With the tenderness, will schedule her for a diagnostic mammogram.    CARDIOVASCULAR.  ECHO 06/2008 revealed normal LV function with EF 55%.  No significant valvular abnormality.  Asymptomatic.    HISTORY OF OVARIAN CYST.  Evaluated by Dr Laurey Morale and released.    MSK.  No joint pains currently.   Saw Dr Jefm Bryant.  S/p shoulder injection.  Helped.      HEALTH MAINTENANCE.  Physical today.  Mammogram 01/25/13 - birads I.   Schedule diagnostic mammogram as outlined.  Pap at physical last year (12/15/12) negative with negative HPV.   Colonoscopy 02/2005 - normal.  Recommended follow up colonoscopy 2016.   IFOB negative 08/09/13.    I spent 40 minutes with the patient and more than 50% of the time was spent in consultation regarding the above.

## 2014-01-30 NOTE — Assessment & Plan Note (Signed)
Saw Dr Allan Friedman.  He felt the lesion was stable.  Felt like it was fat.  Currently asymptomatic.  

## 2014-01-30 NOTE — Assessment & Plan Note (Signed)
Rash as outlined.  Using topical hydrocortisone and taking an antihistamine.  Refer to dermatology for further evaluation.  Also due a skin survey.

## 2014-01-30 NOTE — Assessment & Plan Note (Signed)
Exam as outlined.  Schedule a diagnostic mammogram as outlined.

## 2014-01-30 NOTE — Assessment & Plan Note (Signed)
Low fat/low cholesterol diet and exercise.  Will follow.  Wants to given herself a couple of months to work on diet and exercise.  Recheck lipid panel in 2 months.

## 2014-01-30 NOTE — Assessment & Plan Note (Signed)
Currently being treated.  Follow.

## 2014-01-30 NOTE — Assessment & Plan Note (Signed)
Worked up by Dr Stoioff.  Per patient - everything checked out fine.  Last urinalysis - no rbc's (trace blood).  Follow.   

## 2014-01-30 NOTE — Assessment & Plan Note (Signed)
On effexor and doing well.  Follow.   

## 2014-01-30 NOTE — Assessment & Plan Note (Signed)
Toenail fungus involving both great toes.  Treat with Lamisil as directed.  She has taken previously and tolerated.  Check liver panel monthly.  Follow.

## 2014-01-30 NOTE — Progress Notes (Signed)
Pre-visit discussion using our clinic review tool. No additional management support is needed unless otherwise documented below in the visit note.  

## 2014-02-27 ENCOUNTER — Telehealth: Payer: Self-pay | Admitting: Internal Medicine

## 2014-02-27 ENCOUNTER — Other Ambulatory Visit: Payer: Self-pay | Admitting: Internal Medicine

## 2014-02-27 DIAGNOSIS — B351 Tinea unguium: Secondary | ICD-10-CM

## 2014-02-27 NOTE — Progress Notes (Signed)
Order placed for f/u liver panel.  

## 2014-02-27 NOTE — Telephone Encounter (Signed)
Can schedule her for a liver panel to be checked either this week or next.  Thanks.

## 2014-02-27 NOTE — Telephone Encounter (Signed)
Left message for pt, to call back and schedule non fasting lab appointment for this week or next week

## 2014-02-27 NOTE — Telephone Encounter (Signed)
Spoke with pt to clarify, she started medication on 01/30/14, took it for 13 days, stopped and resumed 4 days ago. So she has taken 17 pills in th last 28 days. Do you want her labs this week since it has been a month, or when since use hasn't been daily?

## 2014-02-27 NOTE — Telephone Encounter (Signed)
Patient Information:  Caller Name: [name]  Phone: [phone]  Patient: Ann Osborne, Ann Osborne  Gender: Female  DOB: 1951/07/18  Age: 63 Years  PCP: [name]  Office Follow Up:  Does the office need to follow up with this patient?: Yes  Instructions For The Office: Please advise regarding when she should have labs drawn since she missed several doses of Lamisil and resumed it on 02/24/14.   Symptoms  Reason For Call & Symptoms: Patient reports she has been on Lamisil  for toe fungus; it was ordered 01/30/14.   She stopped taking it after several days; she resumed it  02/24/14.  (thinks she took it 13 days previously. She asks how to follow up regarding her labs that were to be monitored monthly while on the medication.  She has "fake nail" on one nail and colored polish on the other, so she is unable to tell if the nails have improved.  Reviewed Health History In EMR: Yes  Reviewed Medications In EMR: Yes  Reviewed Allergies In EMR: Yes  Reviewed Surgeries / Procedures: Yes  Date of Onset of Symptoms: Unknown  Guideline(s) Used:  No Protocol Available - Sick Adult  Disposition Per Guideline:   Discuss with PCP and Callback by Nurse Today  Reason For Disposition Reached:   Nursing judgment  Advice Given:  Call Back If:  New symptoms develop  You become worse.  Patient Will Follow Care Advice:  YES

## 2014-02-28 ENCOUNTER — Encounter: Payer: Self-pay | Admitting: Internal Medicine

## 2014-02-28 ENCOUNTER — Ambulatory Visit: Payer: Self-pay | Admitting: Internal Medicine

## 2014-02-28 DIAGNOSIS — N644 Mastodynia: Secondary | ICD-10-CM | POA: Diagnosis not present

## 2014-02-28 DIAGNOSIS — R922 Inconclusive mammogram: Secondary | ICD-10-CM | POA: Diagnosis not present

## 2014-02-28 LAB — HM MAMMOGRAPHY: HM Mammogram: NEGATIVE

## 2014-03-02 ENCOUNTER — Other Ambulatory Visit: Payer: Medicare Other

## 2014-03-15 ENCOUNTER — Telehealth: Payer: Self-pay | Admitting: Internal Medicine

## 2014-03-15 NOTE — Telephone Encounter (Signed)
Mammogram ok.  My chart message sent to pt to see if continued pain.  If persistent pain, needs reevaluation.

## 2014-03-21 DIAGNOSIS — L578 Other skin changes due to chronic exposure to nonionizing radiation: Secondary | ICD-10-CM | POA: Diagnosis not present

## 2014-03-21 DIAGNOSIS — D1800 Hemangioma unspecified site: Secondary | ICD-10-CM | POA: Diagnosis not present

## 2014-03-21 DIAGNOSIS — D239 Other benign neoplasm of skin, unspecified: Secondary | ICD-10-CM | POA: Diagnosis not present

## 2014-03-21 DIAGNOSIS — I8390 Asymptomatic varicose veins of unspecified lower extremity: Secondary | ICD-10-CM | POA: Diagnosis not present

## 2014-03-21 DIAGNOSIS — L821 Other seborrheic keratosis: Secondary | ICD-10-CM | POA: Diagnosis not present

## 2014-03-21 DIAGNOSIS — Z85828 Personal history of other malignant neoplasm of skin: Secondary | ICD-10-CM | POA: Diagnosis not present

## 2014-03-24 ENCOUNTER — Other Ambulatory Visit (INDEPENDENT_AMBULATORY_CARE_PROVIDER_SITE_OTHER): Payer: Medicare Other

## 2014-03-24 DIAGNOSIS — B351 Tinea unguium: Secondary | ICD-10-CM

## 2014-03-24 LAB — HEPATIC FUNCTION PANEL
ALT: 20 U/L (ref 0–35)
AST: 22 U/L (ref 0–37)
Albumin: 4.2 g/dL (ref 3.5–5.2)
Alkaline Phosphatase: 57 U/L (ref 39–117)
BILIRUBIN DIRECT: 0.1 mg/dL (ref 0.0–0.3)
TOTAL PROTEIN: 7.2 g/dL (ref 6.0–8.3)
Total Bilirubin: 0.7 mg/dL (ref 0.2–1.2)

## 2014-03-27 ENCOUNTER — Other Ambulatory Visit: Payer: Self-pay | Admitting: *Deleted

## 2014-03-27 ENCOUNTER — Encounter: Payer: Self-pay | Admitting: Internal Medicine

## 2014-03-27 MED ORDER — TERBINAFINE HCL 250 MG PO TABS: 250.0000 mg | ORAL_TABLET | Freq: Every day | ORAL | Status: DC

## 2014-03-29 NOTE — Telephone Encounter (Signed)
Unread mychart message mailed to patient 

## 2014-04-02 ENCOUNTER — Other Ambulatory Visit: Payer: Self-pay | Admitting: Internal Medicine

## 2014-04-07 DIAGNOSIS — N390 Urinary tract infection, site not specified: Secondary | ICD-10-CM | POA: Diagnosis not present

## 2014-04-07 DIAGNOSIS — R319 Hematuria, unspecified: Secondary | ICD-10-CM | POA: Diagnosis not present

## 2014-04-20 DIAGNOSIS — N390 Urinary tract infection, site not specified: Secondary | ICD-10-CM | POA: Diagnosis not present

## 2014-04-21 DIAGNOSIS — N390 Urinary tract infection, site not specified: Secondary | ICD-10-CM | POA: Diagnosis not present

## 2014-08-02 ENCOUNTER — Encounter: Payer: Self-pay | Admitting: Internal Medicine

## 2014-08-02 ENCOUNTER — Ambulatory Visit (INDEPENDENT_AMBULATORY_CARE_PROVIDER_SITE_OTHER): Payer: Medicare Other | Admitting: Internal Medicine

## 2014-08-02 ENCOUNTER — Other Ambulatory Visit: Payer: Self-pay | Admitting: *Deleted

## 2014-08-02 VITALS — BP 110/70 | HR 68 | Temp 98.3°F | Ht 67.5 in | Wt 183.2 lb

## 2014-08-02 DIAGNOSIS — Z23 Encounter for immunization: Secondary | ICD-10-CM

## 2014-08-02 DIAGNOSIS — F32A Depression, unspecified: Secondary | ICD-10-CM

## 2014-08-02 DIAGNOSIS — R209 Unspecified disturbances of skin sensation: Secondary | ICD-10-CM | POA: Diagnosis not present

## 2014-08-02 DIAGNOSIS — G9389 Other specified disorders of brain: Secondary | ICD-10-CM | POA: Diagnosis not present

## 2014-08-02 DIAGNOSIS — G4730 Sleep apnea, unspecified: Secondary | ICD-10-CM

## 2014-08-02 DIAGNOSIS — E7800 Pure hypercholesterolemia, unspecified: Secondary | ICD-10-CM

## 2014-08-02 DIAGNOSIS — B351 Tinea unguium: Secondary | ICD-10-CM

## 2014-08-02 DIAGNOSIS — G939 Disorder of brain, unspecified: Secondary | ICD-10-CM

## 2014-08-02 DIAGNOSIS — N644 Mastodynia: Secondary | ICD-10-CM

## 2014-08-02 DIAGNOSIS — F329 Major depressive disorder, single episode, unspecified: Secondary | ICD-10-CM

## 2014-08-02 DIAGNOSIS — R319 Hematuria, unspecified: Secondary | ICD-10-CM

## 2014-08-02 DIAGNOSIS — R200 Anesthesia of skin: Secondary | ICD-10-CM | POA: Insufficient documentation

## 2014-08-02 DIAGNOSIS — F3289 Other specified depressive episodes: Secondary | ICD-10-CM

## 2014-08-02 MED ORDER — VENLAFAXINE HCL ER 37.5 MG PO CP24: 37.5000 mg | ORAL_CAPSULE | Freq: Every day | ORAL | Status: DC

## 2014-08-02 MED ORDER — OMEPRAZOLE 20 MG PO CPDR: 20.0000 mg | DELAYED_RELEASE_CAPSULE | Freq: Every day | ORAL | Status: DC

## 2014-08-02 NOTE — Assessment & Plan Note (Signed)
On effexor and doing well.  Follow.   

## 2014-08-02 NOTE — Assessment & Plan Note (Signed)
Saw Dr Derrill Memo.  He felt the lesion was stable.  Felt like it was fat.  Currently asymptomatic.

## 2014-08-02 NOTE — Assessment & Plan Note (Signed)
Off lamisil and doing well.  Follow.

## 2014-08-02 NOTE — Assessment & Plan Note (Signed)
Low fat/low cholesterol diet and exercise.  Will follow.

## 2014-08-02 NOTE — Assessment & Plan Note (Signed)
Resolved.  Mammogram 02/28/14 - Birads I.

## 2014-08-02 NOTE — Assessment & Plan Note (Signed)
Worked up by Dr Bernardo Heater.  Per patient - everything checked out fine.  Last urinalysis - no rbc's (trace blood).  Follow.

## 2014-08-02 NOTE — Assessment & Plan Note (Signed)
Currently being treated.  Using CPAP.  07/10/14 compliance report - 97%.

## 2014-08-02 NOTE — Progress Notes (Signed)
Subjective:    [name], female    DOB: May 23, 1951, 63 y.o.   MRN: [id number]  HPI 63 year old female with past history of depression, adult ADHD and seasonal allergies who comes in today for a scheduled follow up.  Doing well.  Happy.  Eating and drinking well.  Marriage going well.  Not watching her diet.  Plans to start.  Has seen the allergist.  Allergic to cats, dogs and dust.  Overall controlled.  No fever or chills.  No nausea or vomiting.  Bowels stable.  Her main complaint is that of right  lateral leg numbness.  Extends from upper thigh to just below the knee.  Feels like the numbness is more on the surface.  Aggravated when she sits in certain positions or lies on that area.  No back pain.  Has been present now for 6 months and appears to be getting worse.       Past Medical History  Diagnosis Date  . Depression   . Allergy   . GERD (gastroesophageal reflux disease)   . Lyme disease     history  . ADD (attention deficit disorder)   . Arthritis     Outpatient Encounter Prescriptions as of 08/02/2014  Medication Sig  . omeprazole (PRILOSEC) 20 MG capsule Take 1 capsule (20 mg total) by mouth daily.  . [DISCONTINUED] omeprazole (PRILOSEC) 20 MG capsule Take 1 capsule (20 mg total) by mouth daily.  . [DISCONTINUED] venlafaxine XR (EFFEXOR-XR) 37.5 MG 24 hr capsule Take 1 capsule (37.5 mg total) by mouth daily.  . [DISCONTINUED] venlafaxine XR (EFFEXOR-XR) 37.5 MG 24 hr capsule Take 1 capsule (37.5 mg total) by mouth daily.  . [DISCONTINUED] terbinafine (LAMISIL) 250 MG tablet Take 1 tablet (250 mg total) by mouth daily.    Review of Systems Patient denies any headache, lightheadedness or dizziness.  No significant drainage or cough reported.   No chest pain, tightness or palpitations.  No increased shortness of breath or congestion.  No nausea or vomiting.  No acid reflux.  On omeprazole.  Controls.   No abdominal pain or cramping.  No bowel change, such as  diarrhea, significant constipation, BRBPR or melana.  No urine change.  Feels good.  Taking her effexor.  Just got remarried. Happy.  Right leg numbness as outlined.  No weakness.        Objective:   Physical Exam  Filed Vitals:   08/02/14 1006  BP: 110/70  Pulse: 68  Temp: 98.3 F (3.6 C)   63 year old female in no acute distress.   HEENT:  Nares- clear.  Oropharynx - without lesions. NECK:  Supple.  Nontender.  No audible bruit.  HEART:  Appears to be regular. LUNGS:  No crackles or wheezing audible.  Respirations even and unlabored.  RADIAL PULSE:  Equal bilaterally.  ABDOMEN:  Soft, nontender.  Bowel sounds present and normal.  No audible abdominal bruit.  EXTREMITIES:  No increased edema present.  DP pulses palpable and equal bilaterally.      MSK.  No motor weakness noted.  Dorsi and plantar flexion - normal.   NEURO.  Increased sensitivity to pin prick right leg.       Assessment & Plan:  PREVIOUS ABNORMAL MAMMOGRAM.  Saw Dr Tamala Julian.  Had breast biopsy - fibroadenoma.  Mammogram 3/13 - recommended additional views.  Final reading - BiRADS II.  01/25/13 mammogram - Birads I.  Bilateral diagnostic mammogram 02/28/14 - Birads I.  CARDIOVASCULAR.  ECHO 06/2008 revealed normal LV function with EF 55%.  No significant valvular abnormality.  Asymptomatic.    HISTORY OF OVARIAN CYST.  Evaluated by Dr Laurey Morale and released.    MSK.  No joint pains currently.   Saw Dr Jefm Bryant.  S/p shoulder injection.  Helped.      HEALTH MAINTENANCE.  Physical 01/30/14.  Mammogram 02/28/14 - Birads I.  Pap at physical last year (12/15/12) negative with negative HPV.  Colonoscopy 02/2005 - normal.  Recommended follow up colonoscopy 2016.   IFOB negative 08/09/13.    I spent 25 minutes with the patient and more than 50% of the time was spent in consultation regarding the above.

## 2014-08-02 NOTE — Assessment & Plan Note (Signed)
Persistent numbness.  Present for 6 months.  Worsening.  Exam as outlined.  Given the persistence and the hypersensitivity, will refer to neurology for further evaluation and w/up.

## 2014-08-02 NOTE — Progress Notes (Signed)
Pre visit review using our clinic review tool, if applicable. No additional management support is needed unless otherwise documented below in the visit note. 

## 2014-08-17 ENCOUNTER — Other Ambulatory Visit (INDEPENDENT_AMBULATORY_CARE_PROVIDER_SITE_OTHER): Payer: Medicare Other

## 2014-08-17 DIAGNOSIS — R208 Other disturbances of skin sensation: Secondary | ICD-10-CM

## 2014-08-17 DIAGNOSIS — G4730 Sleep apnea, unspecified: Secondary | ICD-10-CM

## 2014-08-17 DIAGNOSIS — E7800 Pure hypercholesterolemia, unspecified: Secondary | ICD-10-CM

## 2014-08-17 DIAGNOSIS — Z79899 Other long term (current) drug therapy: Secondary | ICD-10-CM | POA: Diagnosis not present

## 2014-08-17 DIAGNOSIS — R200 Anesthesia of skin: Secondary | ICD-10-CM

## 2014-08-17 DIAGNOSIS — F329 Major depressive disorder, single episode, unspecified: Secondary | ICD-10-CM

## 2014-08-17 DIAGNOSIS — F32A Depression, unspecified: Secondary | ICD-10-CM

## 2014-08-17 DIAGNOSIS — B351 Tinea unguium: Secondary | ICD-10-CM

## 2014-08-17 LAB — CBC WITH DIFFERENTIAL/PLATELET
BASOS ABS: 0 10*3/uL (ref 0.0–0.1)
Basophils Relative: 0.3 % (ref 0.0–3.0)
EOS ABS: 0 10*3/uL (ref 0.0–0.7)
Eosinophils Relative: 0.7 % (ref 0.0–5.0)
HEMATOCRIT: 38.8 % (ref 36.0–46.0)
HEMOGLOBIN: 13.1 g/dL (ref 12.0–15.0)
LYMPHS ABS: 1.7 10*3/uL (ref 0.7–4.0)
Lymphocytes Relative: 32.1 % (ref 12.0–46.0)
MCHC: 33.8 g/dL (ref 30.0–36.0)
MCV: 90.5 fl (ref 78.0–100.0)
MONO ABS: 0.4 10*3/uL (ref 0.1–1.0)
Monocytes Relative: 7.5 % (ref 3.0–12.0)
NEUTROS ABS: 3.1 10*3/uL (ref 1.4–7.7)
Neutrophils Relative %: 59.4 % (ref 43.0–77.0)
Platelets: 236 10*3/uL (ref 150.0–400.0)
RBC: 4.29 Mil/uL (ref 3.87–5.11)
RDW: 12.8 % (ref 11.5–15.5)
WBC: 5.2 10*3/uL (ref 4.0–10.5)

## 2014-08-17 LAB — HEPATIC FUNCTION PANEL
ALK PHOS: 72 U/L (ref 39–117)
ALT: 22 U/L (ref 0–35)
AST: 22 U/L (ref 0–37)
Albumin: 3.7 g/dL (ref 3.5–5.2)
BILIRUBIN DIRECT: 0.1 mg/dL (ref 0.0–0.3)
Total Bilirubin: 0.8 mg/dL (ref 0.2–1.2)
Total Protein: 7.5 g/dL (ref 6.0–8.3)

## 2014-08-17 LAB — COMPREHENSIVE METABOLIC PANEL
ALK PHOS: 72 U/L (ref 39–117)
ALT: 22 U/L (ref 0–35)
AST: 22 U/L (ref 0–37)
Albumin: 3.7 g/dL (ref 3.5–5.2)
BILIRUBIN TOTAL: 0.8 mg/dL (ref 0.2–1.2)
BUN: 14 mg/dL (ref 6–23)
CO2: 27 meq/L (ref 19–32)
CREATININE: 0.8 mg/dL (ref 0.4–1.2)
Calcium: 10.1 mg/dL (ref 8.4–10.5)
Chloride: 104 mEq/L (ref 96–112)
GFR: 79.81 mL/min (ref >60.00)
Glucose, Bld: 91 mg/dL (ref 70–99)
Potassium: 4.3 mEq/L (ref 3.5–5.1)
Sodium: 139 mEq/L (ref 135–145)
Total Protein: 7.5 g/dL (ref 6.0–8.3)

## 2014-08-17 LAB — LIPID PANEL
Cholesterol: 232 mg/dL — ABNORMAL HIGH (ref 0–200)
HDL: 30.4 mg/dL — AB (ref >39.00)
NONHDL: 201.6
TRIGLYCERIDES: 257 mg/dL — AB (ref 0.0–149.0)
Total CHOL/HDL Ratio: 8
VLDL: 51.4 mg/dL — AB (ref 0.0–40.0)

## 2014-08-17 LAB — TSH: TSH: 2.07 u[IU]/mL (ref 0.35–4.50)

## 2014-08-17 LAB — LDL CHOLESTEROL, DIRECT: Direct LDL: 131.4 mg/dL

## 2014-08-17 LAB — VITAMIN B12: Vitamin B-12: 462 pg/mL (ref 211–911)

## 2014-08-18 ENCOUNTER — Encounter: Payer: Self-pay | Admitting: Internal Medicine

## 2014-08-21 NOTE — Telephone Encounter (Signed)
Unread mychart message mailed to patient 

## 2014-08-28 ENCOUNTER — Encounter: Payer: Self-pay | Admitting: Internal Medicine

## 2014-09-13 ENCOUNTER — Encounter: Payer: Self-pay | Admitting: Internal Medicine

## 2014-09-13 ENCOUNTER — Ambulatory Visit (INDEPENDENT_AMBULATORY_CARE_PROVIDER_SITE_OTHER): Payer: Medicare Other | Admitting: Internal Medicine

## 2014-09-13 VITALS — BP 110/70 | HR 65 | Temp 98.3°F | Ht 67.5 in | Wt 181.5 lb

## 2014-09-13 DIAGNOSIS — K5900 Constipation, unspecified: Secondary | ICD-10-CM | POA: Diagnosis not present

## 2014-09-13 DIAGNOSIS — R208 Other disturbances of skin sensation: Secondary | ICD-10-CM | POA: Diagnosis not present

## 2014-09-13 DIAGNOSIS — F329 Major depressive disorder, single episode, unspecified: Secondary | ICD-10-CM | POA: Diagnosis not present

## 2014-09-13 DIAGNOSIS — R200 Anesthesia of skin: Secondary | ICD-10-CM

## 2014-09-13 DIAGNOSIS — G4730 Sleep apnea, unspecified: Secondary | ICD-10-CM

## 2014-09-13 DIAGNOSIS — F32A Depression, unspecified: Secondary | ICD-10-CM

## 2014-09-13 NOTE — Progress Notes (Signed)
  Subjective:    [name], female    DOB: August 26, 1951, 63 y.o.   MRN: [id number]  HPI 63 year old female with past history of depression, adult ADHD and seasonal allergies who comes in today for a follow up.  Here to discuss her CPAP usage.  Doing well.  Eating and drinking well.  Marriage going well.  She has sleep apnea.  Uses her CPAP every night.  Needs new supplies.  Has tape on her hose.  Has tried to fix her hose and keep it together.  It is not working as well - the water level.  Wears for approximately 7-8 hours per night.  Takes with her on trips.  She does not want to sleep without it.  Does have some issues with constipation.  this is a change for her.  Never had this issue previously.  No blood.  Has tried various medications and diet adjustment.       Past Medical History  Diagnosis Date  . Depression   . Allergy   . GERD (gastroesophageal reflux disease)   . Lyme disease     history  . ADD (attention deficit disorder)   . Arthritis     Outpatient Encounter Prescriptions as of 09/13/2014  Medication Sig  . omeprazole (PRILOSEC) 20 MG capsule Take 1 capsule (20 mg total) by mouth daily.  Marland Kitchen venlafaxine XR (EFFEXOR-XR) 37.5 MG 24 hr capsule Take 1 capsule (37.5 mg total) by mouth daily.    Review of Systems No increased shortness of breath or congestion.  No acid reflux. On omeprazole.  Controls.   No abdominal pain or cramping.  Constipation as outlined.  Change for her.  Uses her CPAP nightly.  Needs new equipment.  See above.          Objective:   Physical Exam  Filed Vitals:   09/13/14 1131  BP: 110/70  Pulse: 65  Temp: 98.3 F (35.46 C)   63 year old female in no acute distress.   HEENT:  Nares- clear.  Oropharynx - without lesions. NECK:  Supple.  Nontender.  No audible bruit.  HEART:  Appears to be regular. LUNGS:  No crackles or wheezing audible.  Respirations even and unlabored.  RADIAL PULSE:  Equal bilaterally.  ABDOMEN:  Soft, nontender.   Bowel sounds present and normal.  No audible abdominal bruit.  EXTREMITIES:  No increased edema present.  DP pulses palpable and equal bilaterally.       Assessment & Plan:  1. Sleep apnea Uses her CPAP regularly.  Needs new equipment.  Does not want to sleep without the machine and should not sleep without the machine.  Takes on trips.  Wears 7-8 hours per night.    2. Depression Doing well on effexor.  Follow.   3. Right leg numbness Had to cancel her appt with neurology.  Needs to reschedule.   4. Constipation, unspecified constipation type Change in bowel function.  Significant change.  Discussed miralax.  Due in 2016 for f/u colonoscopy.  Refer now for evaluation given bowel change.

## 2014-09-13 NOTE — Progress Notes (Signed)
Pre visit review using our clinic review tool, if applicable. No additional management support is needed unless otherwise documented below in the visit note. 

## 2014-09-17 DIAGNOSIS — K5900 Constipation, unspecified: Secondary | ICD-10-CM | POA: Insufficient documentation

## 2014-11-29 DIAGNOSIS — R200 Anesthesia of skin: Secondary | ICD-10-CM | POA: Diagnosis not present

## 2014-12-04 ENCOUNTER — Ambulatory Visit: Payer: Medicare Other | Admitting: Internal Medicine

## 2014-12-28 ENCOUNTER — Ambulatory Visit: Payer: Medicare Other | Admitting: Internal Medicine

## 2015-01-12 DIAGNOSIS — R209 Unspecified disturbances of skin sensation: Secondary | ICD-10-CM | POA: Diagnosis not present

## 2015-01-12 DIAGNOSIS — R200 Anesthesia of skin: Secondary | ICD-10-CM | POA: Diagnosis not present

## 2015-01-15 ENCOUNTER — Ambulatory Visit: Payer: Self-pay | Admitting: Gastroenterology

## 2015-01-15 DIAGNOSIS — F988 Other specified behavioral and emotional disorders with onset usually occurring in childhood and adolescence: Secondary | ICD-10-CM | POA: Diagnosis not present

## 2015-01-15 DIAGNOSIS — Z882 Allergy status to sulfonamides status: Secondary | ICD-10-CM | POA: Diagnosis not present

## 2015-01-15 DIAGNOSIS — Z1211 Encounter for screening for malignant neoplasm of colon: Secondary | ICD-10-CM | POA: Diagnosis not present

## 2015-01-15 DIAGNOSIS — F329 Major depressive disorder, single episode, unspecified: Secondary | ICD-10-CM | POA: Diagnosis not present

## 2015-01-15 DIAGNOSIS — G4730 Sleep apnea, unspecified: Secondary | ICD-10-CM | POA: Diagnosis not present

## 2015-01-15 DIAGNOSIS — K5730 Diverticulosis of large intestine without perforation or abscess without bleeding: Secondary | ICD-10-CM | POA: Diagnosis not present

## 2015-01-15 DIAGNOSIS — Q438 Other specified congenital malformations of intestine: Secondary | ICD-10-CM | POA: Diagnosis not present

## 2015-01-15 DIAGNOSIS — K219 Gastro-esophageal reflux disease without esophagitis: Secondary | ICD-10-CM | POA: Diagnosis not present

## 2015-01-15 LAB — HM COLONOSCOPY

## 2015-01-22 ENCOUNTER — Encounter: Payer: Self-pay | Admitting: Internal Medicine

## 2015-01-30 ENCOUNTER — Encounter: Payer: Self-pay | Admitting: Internal Medicine

## 2015-01-31 MED ORDER — VENLAFAXINE HCL ER 37.5 MG PO CP24: 37.5000 mg | ORAL_CAPSULE | Freq: Every day | ORAL | Status: DC

## 2015-01-31 MED ORDER — OMEPRAZOLE 20 MG PO CPDR: 20.0000 mg | DELAYED_RELEASE_CAPSULE | Freq: Every day | ORAL | Status: DC

## 2015-03-13 ENCOUNTER — Encounter: Payer: Self-pay | Admitting: Internal Medicine

## 2015-03-13 ENCOUNTER — Ambulatory Visit (INDEPENDENT_AMBULATORY_CARE_PROVIDER_SITE_OTHER): Payer: Medicare Other | Admitting: Internal Medicine

## 2015-03-13 VITALS — BP 110/70 | HR 63 | Temp 98.0°F | Ht 67.5 in | Wt 174.0 lb

## 2015-03-13 DIAGNOSIS — G4730 Sleep apnea, unspecified: Secondary | ICD-10-CM | POA: Diagnosis not present

## 2015-03-13 DIAGNOSIS — E7800 Pure hypercholesterolemia, unspecified: Secondary | ICD-10-CM

## 2015-03-13 DIAGNOSIS — F32A Depression, unspecified: Secondary | ICD-10-CM

## 2015-03-13 DIAGNOSIS — G939 Disorder of brain, unspecified: Secondary | ICD-10-CM | POA: Diagnosis not present

## 2015-03-13 DIAGNOSIS — Z0000 Encounter for general adult medical examination without abnormal findings: Secondary | ICD-10-CM

## 2015-03-13 DIAGNOSIS — Z1239 Encounter for other screening for malignant neoplasm of breast: Secondary | ICD-10-CM

## 2015-03-13 DIAGNOSIS — F329 Major depressive disorder, single episode, unspecified: Secondary | ICD-10-CM | POA: Diagnosis not present

## 2015-03-13 DIAGNOSIS — K5900 Constipation, unspecified: Secondary | ICD-10-CM

## 2015-03-13 NOTE — Progress Notes (Signed)
[name], female   DOB: June 26, 1951, 64 y.o.   MRN: [id number]   Subjective:    [name], female    DOB: 1951-10-10, 64 y.o.   MRN: [id number]  HPI  Patient here for a scheduled follow up.  She has adjusted her diet.  Is exercising.  Has lost weight.  No cardiac symptoms with increased activity or exertion.  No sob.  Feels better.  Stress better.   No cough and no acid reflux.  We discussed stopping the omeprazole.     Past Medical History  Diagnosis Date  . Depression   . Allergy   . GERD (gastroesophageal reflux disease)   . Lyme disease     history  . ADD (attention deficit disorder)   . Arthritis     Current Outpatient Prescriptions on File Prior to Visit  Medication Sig Dispense Refill  . omeprazole (PRILOSEC) 20 MG capsule Take 1 capsule (20 mg total) by mouth daily. 90 capsule 1  . venlafaxine XR (EFFEXOR-XR) 37.5 MG 24 hr capsule Take 1 capsule (37.5 mg total) by mouth daily. 90 capsule 0   No current facility-administered medications on file prior to visit.    Review of Systems  Constitutional: Negative for appetite change and unexpected weight change (has adjusted her diet and has lost weight. ).  HENT: Negative for congestion and sinus pressure.   Respiratory: Negative for cough, chest tightness and shortness of breath.   Cardiovascular: Negative for chest pain, palpitations and leg swelling.  Gastrointestinal: Negative for nausea, vomiting, abdominal pain and diarrhea.  Neurological: Negative for dizziness, light-headedness and headaches.  Psychiatric/Behavioral: Negative for dysphoric mood and agitation.       Objective:    Physical Exam  Constitutional: She appears well-developed and well-nourished. No distress.  HENT:  Nose: Nose normal.  Mouth/Throat: Oropharynx is clear and moist.  Neck: Neck supple. No thyromegaly present.  Cardiovascular: Normal rate and regular rhythm.   Pulmonary/Chest: Breath sounds normal. No  respiratory distress. She has no wheezes.  Abdominal: Soft. Bowel sounds are normal. There is no tenderness.  Musculoskeletal: She exhibits no edema or tenderness.  Lymphadenopathy:    She has no cervical adenopathy.  Psychiatric: She has a normal mood and affect. Her behavior is normal.    BP 110/70 mmHg  Pulse 63  Temp(Src) 98 F (36.7 C) (Oral)  Ht 5' 7.5" (1.715 m)  Wt 174 lb (78.926 kg)  BMI 26.83 kg/m2  SpO2 95%  LMP 07/16/1995 Wt Readings from Last 3 Encounters:  03/13/15 174 lb (78.926 kg)  09/13/14 181 lb 8 oz (82.328 kg)  08/02/14 183 lb 4 oz (83.122 kg)     Lab Results  Component Value Date   WBC 5.2 08/17/2014   HGB 13.1 08/17/2014   HCT 38.8 08/17/2014   PLT 236.0 08/17/2014   GLUCOSE 91 08/17/2014   CHOL 232* 08/17/2014   TRIG 257.0* 08/17/2014   HDL 30.40* 08/17/2014   LDLDIRECT 131.4 08/17/2014   ALT 22 08/17/2014   ALT 22 08/17/2014   AST 22 08/17/2014   AST 22 08/17/2014   NA 139 08/17/2014   K 4.3 08/17/2014   CL 104 08/17/2014   CREATININE 0.8 08/17/2014   BUN 14 08/17/2014   CO2 27 08/17/2014   TSH 2.07 08/17/2014       Assessment & Plan:   Problem List Items Addressed This Visit    Cerebellar lesion    Saw Dr Jorene Minors.  Felt  lesion stable.  Felt like it was fat.        Constipation    Stable.        Depression    Doing well on effexor.  Feels better with exercise, diet adjustment and weight loss.        Health care maintenance    Schedule physical.  Mammogram 03/03/14 - Birads I.  Schedule mammogram.  PAP 12/13/12 - negative with negative HPV.  Colonoscopy 01/15/15 - tortuous colon.  Recommended f/u colonoscopy in 10 years.        Hypercholesterolemia    Low cholesterol diet and exercise.  Has lost weight.  Check lipid panel with next fasting labs.        Relevant Orders   Lipid panel   Sleep apnea    Using CPAP.       Relevant Orders   Comprehensive metabolic panel    Other Visit Diagnoses    Breast cancer  screening    -  Primary    Relevant Orders    MM DIGITAL SCREENING BILATERAL        [name], MD

## 2015-03-13 NOTE — Progress Notes (Signed)
Pre visit review using our clinic review tool, if applicable. No additional management support is needed unless otherwise documented below in the visit note. 

## 2015-03-14 ENCOUNTER — Encounter: Payer: Self-pay | Admitting: Internal Medicine

## 2015-03-14 DIAGNOSIS — Z0000 Encounter for general adult medical examination without abnormal findings: Secondary | ICD-10-CM | POA: Insufficient documentation

## 2015-03-14 NOTE — Assessment & Plan Note (Signed)
Schedule physical.  Mammogram 03/03/14 - Birads I.  Schedule mammogram.  PAP 12/13/12 - negative with negative HPV.  Colonoscopy 01/15/15 - tortuous colon.  Recommended f/u colonoscopy in 10 years.

## 2015-03-14 NOTE — Assessment & Plan Note (Signed)
Using CPAP 

## 2015-03-14 NOTE — Assessment & Plan Note (Signed)
Low cholesterol diet and exercise.  Has lost weight.  Check lipid panel with next fasting labs.

## 2015-03-14 NOTE — Assessment & Plan Note (Signed)
Saw Dr Jorene Minors.  Felt lesion stable.  Felt like it was fat.

## 2015-03-14 NOTE — Assessment & Plan Note (Signed)
Stable

## 2015-03-14 NOTE — Assessment & Plan Note (Signed)
Doing well on effexor.  Feels better with exercise, diet adjustment and weight loss.

## 2015-03-22 DIAGNOSIS — L578 Other skin changes due to chronic exposure to nonionizing radiation: Secondary | ICD-10-CM | POA: Diagnosis not present

## 2015-03-22 DIAGNOSIS — D1800 Hemangioma unspecified site: Secondary | ICD-10-CM | POA: Diagnosis not present

## 2015-03-22 DIAGNOSIS — Z85828 Personal history of other malignant neoplasm of skin: Secondary | ICD-10-CM | POA: Diagnosis not present

## 2015-03-22 DIAGNOSIS — Z1283 Encounter for screening for malignant neoplasm of skin: Secondary | ICD-10-CM | POA: Diagnosis not present

## 2015-03-22 DIAGNOSIS — I781 Nevus, non-neoplastic: Secondary | ICD-10-CM | POA: Diagnosis not present

## 2015-03-22 DIAGNOSIS — L821 Other seborrheic keratosis: Secondary | ICD-10-CM | POA: Diagnosis not present

## 2015-03-22 DIAGNOSIS — I8393 Asymptomatic varicose veins of bilateral lower extremities: Secondary | ICD-10-CM | POA: Diagnosis not present

## 2015-03-22 DIAGNOSIS — L814 Other melanin hyperpigmentation: Secondary | ICD-10-CM | POA: Diagnosis not present

## 2015-03-22 DIAGNOSIS — L820 Inflamed seborrheic keratosis: Secondary | ICD-10-CM | POA: Diagnosis not present

## 2015-03-27 ENCOUNTER — Other Ambulatory Visit: Payer: Medicare Other

## 2015-03-27 ENCOUNTER — Other Ambulatory Visit (INDEPENDENT_AMBULATORY_CARE_PROVIDER_SITE_OTHER): Payer: Medicare Other

## 2015-03-27 DIAGNOSIS — E7800 Pure hypercholesterolemia, unspecified: Secondary | ICD-10-CM

## 2015-03-27 DIAGNOSIS — G4730 Sleep apnea, unspecified: Secondary | ICD-10-CM | POA: Diagnosis not present

## 2015-03-27 LAB — COMPREHENSIVE METABOLIC PANEL
ALT: 14 U/L (ref 0–35)
AST: 18 U/L (ref 0–37)
Albumin: 4.1 g/dL (ref 3.5–5.2)
Alkaline Phosphatase: 66 U/L (ref 39–117)
BILIRUBIN TOTAL: 0.6 mg/dL (ref 0.2–1.2)
BUN: 11 mg/dL (ref 6–23)
CALCIUM: 10.2 mg/dL (ref 8.4–10.5)
CHLORIDE: 105 meq/L (ref 96–112)
CO2: 28 mEq/L (ref 19–32)
CREATININE: 0.77 mg/dL (ref 0.40–1.20)
GFR: 79.66 mL/min (ref >60.00)
Glucose, Bld: 94 mg/dL (ref 70–99)
Potassium: 4 mEq/L (ref 3.5–5.1)
Sodium: 138 mEq/L (ref 135–145)
Total Protein: 6.8 g/dL (ref 6.0–8.3)

## 2015-03-27 LAB — LIPID PANEL
CHOL/HDL RATIO: 5
Cholesterol: 191 mg/dL (ref 0–200)
HDL: 35.9 mg/dL — ABNORMAL LOW (ref >39.00)
LDL CALC: 129 mg/dL — AB (ref 0–99)
NONHDL: 155.1
Triglycerides: 131 mg/dL (ref 0.0–149.0)
VLDL: 26.2 mg/dL (ref 0.0–40.0)

## 2015-03-28 ENCOUNTER — Encounter: Payer: Self-pay | Admitting: Internal Medicine

## 2015-04-03 NOTE — Telephone Encounter (Signed)
Mailed unread message to pt  

## 2015-04-27 ENCOUNTER — Telehealth: Payer: Self-pay | Admitting: *Deleted

## 2015-04-27 NOTE — Telephone Encounter (Signed)
Pt coming in for labs on 06.20.2016 what labs and dx? 

## 2015-04-29 NOTE — Telephone Encounter (Signed)
I reviewed her chart.  See if you can get in touch with pt prior to her lab appt and let her know that she does not need to come in for labs.  Just had labs in May.

## 2015-04-30 ENCOUNTER — Other Ambulatory Visit: Payer: Medicare Other

## 2015-04-30 NOTE — Telephone Encounter (Signed)
Tried calling pt didn't get an answer

## 2015-05-17 ENCOUNTER — Other Ambulatory Visit: Payer: Self-pay | Admitting: Internal Medicine

## 2015-05-17 MED ORDER — VENLAFAXINE HCL ER 37.5 MG PO CP24: 37.5000 mg | ORAL_CAPSULE | Freq: Every day | ORAL | Status: DC

## 2015-06-07 DIAGNOSIS — R300 Dysuria: Secondary | ICD-10-CM | POA: Diagnosis not present

## 2015-06-07 DIAGNOSIS — N3090 Cystitis, unspecified without hematuria: Secondary | ICD-10-CM | POA: Diagnosis not present

## 2015-07-13 ENCOUNTER — Other Ambulatory Visit: Payer: Self-pay | Admitting: *Deleted

## 2015-07-13 ENCOUNTER — Encounter: Payer: Self-pay | Admitting: Internal Medicine

## 2015-07-13 MED ORDER — OMEPRAZOLE 20 MG PO CPDR: 20.0000 mg | DELAYED_RELEASE_CAPSULE | Freq: Every day | ORAL | Status: DC

## 2015-07-18 ENCOUNTER — Encounter: Payer: Medicare Other | Admitting: Internal Medicine

## 2015-08-22 ENCOUNTER — Other Ambulatory Visit: Payer: Self-pay | Admitting: Internal Medicine

## 2015-08-23 MED ORDER — VENLAFAXINE HCL ER 37.5 MG PO CP24: 37.5000 mg | ORAL_CAPSULE | Freq: Every day | ORAL | Status: DC

## 2015-08-31 DIAGNOSIS — Z23 Encounter for immunization: Secondary | ICD-10-CM | POA: Diagnosis not present

## 2015-09-10 ENCOUNTER — Other Ambulatory Visit: Payer: Self-pay

## 2015-09-10 ENCOUNTER — Telehealth: Payer: Self-pay | Admitting: Internal Medicine

## 2015-09-10 MED ORDER — VENLAFAXINE HCL ER 37.5 MG PO CP24: 37.5000 mg | ORAL_CAPSULE | Freq: Every day | ORAL | Status: DC

## 2015-09-10 NOTE — Telephone Encounter (Signed)
Refilled per patients request to [facility]

## 2015-09-10 NOTE — Telephone Encounter (Signed)
Pt called to advise that she needs a refill on Effexor. Pt stated that she had made the request via mail order but still has not received the rx and now will be out of medication tomorrow.msn

## 2015-09-11 NOTE — Telephone Encounter (Signed)
Pt called to state that she received her rx

## 2015-09-17 ENCOUNTER — Encounter: Payer: Self-pay | Admitting: Internal Medicine

## 2015-09-17 ENCOUNTER — Ambulatory Visit (INDEPENDENT_AMBULATORY_CARE_PROVIDER_SITE_OTHER): Payer: Medicare Other | Admitting: Internal Medicine

## 2015-09-17 VITALS — BP 100/60 | HR 62 | Temp 97.8°F | Resp 18 | Ht 67.5 in | Wt 174.5 lb

## 2015-09-17 DIAGNOSIS — E7800 Pure hypercholesterolemia, unspecified: Secondary | ICD-10-CM | POA: Diagnosis not present

## 2015-09-17 DIAGNOSIS — H5789 Other specified disorders of eye and adnexa: Secondary | ICD-10-CM

## 2015-09-17 DIAGNOSIS — R200 Anesthesia of skin: Secondary | ICD-10-CM

## 2015-09-17 DIAGNOSIS — G939 Disorder of brain, unspecified: Secondary | ICD-10-CM

## 2015-09-17 DIAGNOSIS — F329 Major depressive disorder, single episode, unspecified: Secondary | ICD-10-CM

## 2015-09-17 DIAGNOSIS — Z0000 Encounter for general adult medical examination without abnormal findings: Secondary | ICD-10-CM

## 2015-09-17 DIAGNOSIS — F32A Depression, unspecified: Secondary | ICD-10-CM

## 2015-09-17 DIAGNOSIS — K5900 Constipation, unspecified: Secondary | ICD-10-CM

## 2015-09-17 DIAGNOSIS — G4730 Sleep apnea, unspecified: Secondary | ICD-10-CM | POA: Diagnosis not present

## 2015-09-17 DIAGNOSIS — R208 Other disturbances of skin sensation: Secondary | ICD-10-CM

## 2015-09-17 DIAGNOSIS — H578 Other specified disorders of eye and adnexa: Secondary | ICD-10-CM

## 2015-09-17 NOTE — Assessment & Plan Note (Signed)
Doing well on current regimen.  Takes fiber, stool softener and stays hydrated.  Follow.

## 2015-09-17 NOTE — Assessment & Plan Note (Signed)
Doing well on effexor.  Feels good.  Follow.

## 2015-09-17 NOTE — Progress Notes (Signed)
Pre-visit discussion using our clinic review tool. No additional management support is needed unless otherwise documented below in the visit note.  

## 2015-09-17 NOTE — Progress Notes (Signed)
[name], female   DOB: 1951-10-31, 64 y.o.   MRN: [id number]   Subjective:    [name], female    DOB: 12/04/1950, 64 y.o.   MRN: [id number]  HPI  Patient with past history of depression, GERD and ADHD who comes in today to follow up on these issues as well as for a complete physical exam.  She has been having issues with her right leg being numb.  She saw Dr Manuella Ghazi.  Had NCS.  Ok.  Taking alpha-lipoic acid.  Is better.  Still occasionally flares.  Overall better.  Is exercising more.  Relationship going well.  No chest pain or tightness with increased activity or exertion.  No sob.  No acid reflux.  No abdominal pain or cramping.  Bowels stable.  Overall feels better.  Handling stress.  On effexor.     Past Medical History  Diagnosis Date  . Depression   . Allergy   . GERD (gastroesophageal reflux disease)   . Lyme disease     history  . ADD (attention deficit disorder)   . Arthritis    Past Surgical History  Procedure Laterality Date  . Tubal ligation  1978    bilateral  . Wisdom tooth extraction  1980  . Breast biopsy  20 yrs ago   Family History  Problem Relation Age of Onset  . Uterine cancer Mother   . Heart disease Maternal Grandmother   . Alcohol abuse Maternal Grandfather   . Parkinson's disease Paternal Grandmother   . Depression Paternal Grandfather    Social History   Social History  . Marital Status: Widowed    Spouse Name: N/A  . Number of Children: 2  . Years of Education: N/A   Social History Main Topics  . Smoking status: Never Smoker   . Smokeless tobacco: Never Used  . Alcohol Use: 0.0 oz/week    0 Standard drinks or equivalent per week     Comment: occasional  . Drug Use: No  . Sexual Activity: Not Asked   Other Topics Concern  . None   Social History Narrative    Outpatient Encounter Prescriptions as of 09/17/2015  Medication Sig  . omeprazole (PRILOSEC) 20 MG capsule Take 1 capsule (20 mg total) by  mouth daily.  Marland Kitchen venlafaxine XR (EFFEXOR-XR) 37.5 MG 24 hr capsule Take 1 capsule (37.5 mg total) by mouth daily.   No facility-administered encounter medications on file as of 09/17/2015.    Review of Systems  Constitutional: Negative for appetite change and unexpected weight change.  HENT: Negative for congestion and sinus pressure.   Eyes: Negative for pain and visual disturbance.  Respiratory: Negative for cough, chest tightness and shortness of breath.   Cardiovascular: Negative for chest pain, palpitations and leg swelling.  Gastrointestinal: Negative for nausea, vomiting, abdominal pain and diarrhea.  Genitourinary: Negative for dysuria and difficulty urinating.  Musculoskeletal: Negative for back pain and joint swelling.  Skin: Negative for color change and rash.  Neurological: Negative for dizziness, light-headedness and headaches.       Numbness - right leg.  Better.    Hematological: Negative for adenopathy. Does not bruise/bleed easily.  Psychiatric/Behavioral: Negative for dysphoric mood and agitation.       Objective:     Blood pressure rechecked by me:  118/62  Physical Exam  Constitutional: She is oriented to person, place, and time. She appears well-developed and well-nourished. No distress.  HENT:  Nose: Nose normal.  Mouth/Throat: Oropharynx is clear and moist.  Eyes: Right eye exhibits no discharge. Left eye exhibits no discharge. No scleral icterus.  Neck: Neck supple. No thyromegaly present.  Cardiovascular: Normal rate and regular rhythm.   Pulmonary/Chest: Breath sounds normal. No accessory muscle usage. No tachypnea. No respiratory distress. She has no decreased breath sounds. She has no wheezes. She has no rhonchi. Right breast exhibits no inverted nipple, no mass, no nipple discharge and no tenderness (no axillary adenopathy). Left breast exhibits no inverted nipple, no mass, no nipple discharge and no tenderness (no axilarry adenopathy).  Abdominal: Soft.  Bowel sounds are normal. There is no tenderness.  Musculoskeletal: She exhibits no edema or tenderness.  Lymphadenopathy:    She has no cervical adenopathy.  Neurological: She is alert and oriented to person, place, and time.  Skin: Skin is warm. No rash noted. No erythema.  Psychiatric: She has a normal mood and affect. Her behavior is normal.    BP 100/60 mmHg  Pulse 62  Temp(Src) 97.8 F (36.6 C) (Oral)  Resp 18  Ht 5' 7.5" (1.715 m)  Wt 174 lb 8 oz (79.153 kg)  BMI 26.91 kg/m2  SpO2 97%  LMP 07/16/1995 Wt Readings from Last 3 Encounters:  09/17/15 174 lb 8 oz (79.153 kg)  03/13/15 174 lb (78.926 kg)  09/13/14 181 lb 8 oz (82.328 kg)     Lab Results  Component Value Date   WBC 5.2 08/17/2014   HGB 13.1 08/17/2014   HCT 38.8 08/17/2014   PLT 236.0 08/17/2014   GLUCOSE 94 03/27/2015   CHOL 191 03/27/2015   TRIG 131.0 03/27/2015   HDL 35.90* 03/27/2015   LDLDIRECT 131.4 08/17/2014   LDLCALC 129* 03/27/2015   ALT 14 03/27/2015   AST 18 03/27/2015   NA 138 03/27/2015   K 4.0 03/27/2015   CL 105 03/27/2015   CREATININE 0.77 03/27/2015   BUN 11 03/27/2015   CO2 28 03/27/2015   TSH 2.07 08/17/2014       Assessment & Plan:   Problem List Items Addressed This Visit    Cerebellar lesion    Saw Dr Jorene Minors.  Felt lesion stable.  Felt was - fat.       Constipation    Doing well on current regimen.  Takes fiber, stool softener and stays hydrated.  Follow.        Depression    Doing well on effexor.  Feels good.  Follow.       Health care maintenance    Physical today 09/17/15.  Colonoscopy 01/15/15 - tortuous colon, diverticulosis in the sigmoid colon and in the descending colon.  Recommended f/u colonoscopy in 10 years.        Hypercholesterolemia - Primary    Low cholesterol diet and exercise.  Follow lipid panel.        Relevant Orders   Lipid panel   Comprehensive metabolic panel   Right leg numbness    Saw neurology.  Had NCS - ok.  Is better.   Follow.        Sleep apnea    Using CPAP.        Relevant Orders   CBC with Differential/Platelet   TSH    Other Visit Diagnoses    Routine general medical examination at a health care facility        Relevant Orders    Hepatitis C Antibody        [name], MD

## 2015-09-17 NOTE — Assessment & Plan Note (Signed)
Using CPAP 

## 2015-09-17 NOTE — Assessment & Plan Note (Signed)
Saw neurology.  Had NCS - ok.  Is better.  Follow.

## 2015-09-17 NOTE — Addendum Note (Signed)
Addended by: Alisa Graff on: 09/17/2015 11:09 PM   Modules accepted: Miquel Dunn

## 2015-09-17 NOTE — Assessment & Plan Note (Signed)
Saw Dr Jorene Minors.  Felt lesion stable.  Felt was - fat.

## 2015-09-17 NOTE — Assessment & Plan Note (Signed)
Physical today 09/17/15.  Colonoscopy 01/15/15 - tortuous colon, diverticulosis in the sigmoid colon and in the descending colon.  Recommended f/u colonoscopy in 10 years.

## 2015-09-17 NOTE — Assessment & Plan Note (Signed)
Low cholesterol diet and exercise.  Follow lipid panel.   

## 2015-09-26 ENCOUNTER — Ambulatory Visit
Admission: RE | Admit: 2015-09-26 | Discharge: 2015-09-26 | Disposition: A | Discharge status: Home / Self Care | Payer: Self-pay | Source: Ambulatory Visit | Attending: Internal Medicine | Admitting: Internal Medicine

## 2015-09-26 DIAGNOSIS — Z1239 Encounter for other screening for malignant neoplasm of breast: Secondary | ICD-10-CM

## 2015-10-08 ENCOUNTER — Other Ambulatory Visit: Payer: Self-pay

## 2015-10-12 ENCOUNTER — Other Ambulatory Visit (INDEPENDENT_AMBULATORY_CARE_PROVIDER_SITE_OTHER): Payer: Medicare Other

## 2015-10-12 DIAGNOSIS — E7800 Pure hypercholesterolemia, unspecified: Secondary | ICD-10-CM | POA: Diagnosis not present

## 2015-10-12 DIAGNOSIS — G4730 Sleep apnea, unspecified: Secondary | ICD-10-CM

## 2015-10-12 DIAGNOSIS — Z0000 Encounter for general adult medical examination without abnormal findings: Secondary | ICD-10-CM | POA: Diagnosis not present

## 2015-10-12 LAB — CBC WITH DIFFERENTIAL/PLATELET
Basophils Absolute: 0 10*3/uL (ref 0.0–0.1)
Basophils Relative: 0.3 % (ref 0.0–3.0)
EOS PCT: 0.9 % (ref 0.0–5.0)
Eosinophils Absolute: 0 10*3/uL (ref 0.0–0.7)
HEMATOCRIT: 39.4 % (ref 36.0–46.0)
HEMOGLOBIN: 13.3 g/dL (ref 12.0–15.0)
LYMPHS PCT: 33.3 % (ref 12.0–46.0)
Lymphs Abs: 1.7 10*3/uL (ref 0.7–4.0)
MCHC: 33.9 g/dL (ref 30.0–36.0)
MCV: 90.9 fl (ref 78.0–100.0)
MONO ABS: 0.3 10*3/uL (ref 0.1–1.0)
MONOS PCT: 6.7 % (ref 3.0–12.0)
Neutro Abs: 3 10*3/uL (ref 1.4–7.7)
Neutrophils Relative %: 58.8 % (ref 43.0–77.0)
Platelets: 256 10*3/uL (ref 150.0–400.0)
RBC: 4.33 Mil/uL (ref 3.87–5.11)
RDW: 12.9 % (ref 11.5–15.5)
WBC: 5.1 10*3/uL (ref 4.0–10.5)

## 2015-10-12 LAB — COMPREHENSIVE METABOLIC PANEL
ALT: 19 U/L (ref 0–35)
AST: 19 U/L (ref 0–37)
Albumin: 4.2 g/dL (ref 3.5–5.2)
Alkaline Phosphatase: 77 U/L (ref 39–117)
BUN: 11 mg/dL (ref 6–23)
CALCIUM: 10.6 mg/dL — AB (ref 8.4–10.5)
CO2: 27 meq/L (ref 19–32)
CREATININE: 0.77 mg/dL (ref 0.40–1.20)
Chloride: 104 mEq/L (ref 96–112)
GFR: 79.52 mL/min (ref >60.00)
GLUCOSE: 96 mg/dL (ref 70–99)
Potassium: 4.1 mEq/L (ref 3.5–5.1)
SODIUM: 139 meq/L (ref 135–145)
Total Bilirubin: 0.7 mg/dL (ref 0.2–1.2)
Total Protein: 7.4 g/dL (ref 6.0–8.3)

## 2015-10-12 LAB — LIPID PANEL
CHOLESTEROL: 208 mg/dL — AB (ref 0–200)
HDL: 34.8 mg/dL — ABNORMAL LOW (ref >39.00)
LDL CALC: 146 mg/dL — AB (ref 0–99)
NonHDL: 173.21
Total CHOL/HDL Ratio: 6
Triglycerides: 135 mg/dL (ref 0.0–149.0)
VLDL: 27 mg/dL (ref 0.0–40.0)

## 2015-10-12 LAB — TSH: TSH: 1.87 u[IU]/mL (ref 0.35–4.50)

## 2015-10-13 ENCOUNTER — Other Ambulatory Visit: Payer: Self-pay | Admitting: Internal Medicine

## 2015-10-13 LAB — HEPATITIS C ANTIBODY: HCV AB: NEGATIVE

## 2015-10-13 NOTE — Progress Notes (Signed)
Order placed for calcium recheck 

## 2015-10-15 ENCOUNTER — Ambulatory Visit
Admission: RE | Admit: 2015-10-15 | Discharge: 2015-10-15 | Disposition: A | Discharge status: Home / Self Care | Payer: Medicare Other | Source: Ambulatory Visit | Attending: Internal Medicine | Admitting: Internal Medicine

## 2015-10-15 ENCOUNTER — Other Ambulatory Visit: Payer: Self-pay | Admitting: Internal Medicine

## 2015-10-15 ENCOUNTER — Encounter: Payer: Self-pay | Admitting: *Deleted

## 2015-10-15 DIAGNOSIS — Z1239 Encounter for other screening for malignant neoplasm of breast: Secondary | ICD-10-CM

## 2015-10-15 DIAGNOSIS — Z1231 Encounter for screening mammogram for malignant neoplasm of breast: Secondary | ICD-10-CM | POA: Insufficient documentation

## 2016-01-26 ENCOUNTER — Emergency Department: Payer: Medicare Other

## 2016-01-26 DIAGNOSIS — R509 Fever, unspecified: Secondary | ICD-10-CM | POA: Diagnosis not present

## 2016-01-26 DIAGNOSIS — J069 Acute upper respiratory infection, unspecified: Secondary | ICD-10-CM | POA: Diagnosis not present

## 2016-01-26 DIAGNOSIS — F909 Attention-deficit hyperactivity disorder, unspecified type: Secondary | ICD-10-CM | POA: Insufficient documentation

## 2016-01-26 DIAGNOSIS — F329 Major depressive disorder, single episode, unspecified: Secondary | ICD-10-CM | POA: Insufficient documentation

## 2016-01-26 DIAGNOSIS — R6883 Chills (without fever): Secondary | ICD-10-CM | POA: Diagnosis not present

## 2016-01-26 DIAGNOSIS — Z79899 Other long term (current) drug therapy: Secondary | ICD-10-CM | POA: Insufficient documentation

## 2016-01-26 DIAGNOSIS — R05 Cough: Secondary | ICD-10-CM | POA: Diagnosis not present

## 2016-01-26 LAB — CBC
HCT: 36.7 % (ref 35.0–47.0)
Hemoglobin: 12.7 g/dL (ref 12.0–16.0)
MCH: 30.8 pg (ref 26.0–34.0)
MCHC: 34.5 g/dL (ref 32.0–36.0)
MCV: 89.3 fL (ref 80.0–100.0)
PLATELETS: 189 10*3/uL (ref 150–440)
RBC: 4.11 MIL/uL (ref 3.80–5.20)
RDW: 12.5 % (ref 11.5–14.5)
WBC: 6 10*3/uL (ref 3.6–11.0)

## 2016-01-26 LAB — RAPID INFLUENZA A&B ANTIGENS (ARMC ONLY)
INFLUENZA A (ARMC): NEGATIVE
INFLUENZA B (ARMC): NEGATIVE

## 2016-01-26 LAB — BASIC METABOLIC PANEL
Anion gap: 3 — ABNORMAL LOW (ref 5–15)
BUN: 8 mg/dL (ref 6–20)
CALCIUM: 9.7 mg/dL (ref 8.9–10.3)
CHLORIDE: 106 mmol/L (ref 101–111)
CO2: 27 mmol/L (ref 22–32)
Creatinine, Ser: 0.83 mg/dL (ref 0.44–1.00)
GFR calc Af Amer: >60 mL/min (ref >60)
GFR calc non Af Amer: >60 mL/min (ref >60)
GLUCOSE: 117 mg/dL — AB (ref 65–99)
Potassium: 3.5 mmol/L (ref 3.5–5.1)
Sodium: 136 mmol/L (ref 135–145)

## 2016-01-26 LAB — TROPONIN I

## 2016-01-26 MED ORDER — IBUPROFEN 800 MG PO TABS
800.0000 mg | ORAL_TABLET | Freq: Once | ORAL | Status: AC
  Administered 2016-01-26: 800 mg via ORAL

## 2016-01-26 MED ORDER — IBUPROFEN 800 MG PO TABS
ORAL_TABLET | ORAL | Status: AC
  Filled 2016-01-26: qty 1

## 2016-01-26 NOTE — ED Notes (Signed)
Pt c/o earaches, nonproductive cough, sinus pressure and pain; body aches; fever upon arrival that pt was not aware of; chills; talking in complete coherent sentences

## 2016-01-27 ENCOUNTER — Emergency Department
Admission: EM | Admit: 2016-01-27 | Discharge: 2016-01-27 | Disposition: A | Discharge status: Home / Self Care | Payer: Medicare Other | Attending: Emergency Medicine | Admitting: Emergency Medicine

## 2016-01-27 ENCOUNTER — Encounter: Payer: Self-pay | Admitting: Emergency Medicine

## 2016-01-27 DIAGNOSIS — J069 Acute upper respiratory infection, unspecified: Secondary | ICD-10-CM | POA: Diagnosis not present

## 2016-01-27 LAB — URINALYSIS COMPLETE WITH MICROSCOPIC (ARMC ONLY)
BILIRUBIN URINE: NEGATIVE
Bacteria, UA: NONE SEEN
GLUCOSE, UA: NEGATIVE mg/dL
Ketones, ur: NEGATIVE mg/dL
LEUKOCYTES UA: NEGATIVE
Nitrite: NEGATIVE
Protein, ur: NEGATIVE mg/dL
SQUAMOUS EPITHELIAL / LPF: NONE SEEN
Specific Gravity, Urine: 1.013 (ref 1.005–1.030)
pH: 6 (ref 5.0–8.0)

## 2016-01-27 NOTE — ED Notes (Signed)

## 2016-01-27 NOTE — ED Provider Notes (Signed)
[facility] Emergency Department Provider Note  ____________________________________________   I have reviewed the triage vital signs and the nursing notes.   HISTORY  Chief Complaint Cough; Fever; Facial Pain; Otalgia; Arm Pain; and Chest Pain    HPI Ann Osborne is a 65 y.o. female who is healthy, states that she has had flulike symptoms for the last 2 days. Approximately 48 hours. Occasionally productive cough, rhinorrhea, both ear hurts, fever, no dysuria no urinary frequency no abdominal pain no nausea no vomiting or diarrhea. Positive sick contacts with similar.Patient states she does not feel lethargic or otherwise weekend. She is tolerating by mouth with no difficulty. Mild headache worse with cough. No stiff neck.  Past Medical History  Diagnosis Date  . Depression   . Allergy   . GERD (gastroesophageal reflux disease)   . Lyme disease     history  . ADD (attention deficit disorder)     Patient Active Problem List   Diagnosis Date Noted  . Health care maintenance 03/14/2015  . Constipation 09/17/2014  . Right leg numbness 08/02/2014  . Toenail fungus 01/30/2014  . Hypercholesterolemia 01/30/2014  . Rash 01/30/2014  . Breast tenderness 07/25/2013  . Sleep apnea 12/15/2012  . Depression 12/15/2012  . 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 Hematuria 12/15/2012    Past Surgical History  Procedure Laterality Date  . Tubal ligation  1978    bilateral  . Wisdom tooth extraction  1980  . Breast biopsy Left 20 yrs ago    Current Outpatient Rx  Name  Route  Sig  Dispense  Refill  . omeprazole (PRILOSEC) 20 MG capsule   Oral   Take 1 capsule (20 mg total) by mouth daily.   90 capsule   1   . venlafaxine XR (EFFEXOR-XR) 37.5 MG 24 hr capsule   Oral   Take 1 capsule (37.5 mg total) by mouth daily.   90 capsule   4     Allergies Compazine and Sulfa antibiotics  Family History  Problem Relation Age of Onset  . Uterine  cancer Mother   . Heart disease Maternal Grandmother   . Alcohol abuse Maternal Grandfather   . Parkinson's disease Paternal Grandmother   . Depression Paternal Grandfather   . Breast cancer Neg Hx     Social History Social History  Substance Use Topics  . Smoking status: Never Smoker   . Smokeless tobacco: Never Used  . Alcohol Use: 0.0 oz/week    0 Standard drinks or equivalent per week     Comment: occasional    Review of Systems Constitutional: Positive chills Eyes: No visual changes. ENT: No sore throat. No stiff neck no neck pain Cardiovascular: Denies chest pain. Respiratory: Denies shortness of breath. Positive cough Gastrointestinal:   no vomiting.  No diarrhea.  No constipation. Genitourinary: Negative for dysuria. Musculoskeletal: Negative lower extremity swelling Skin: Negative for rash. Neurological: Negative for significant headaches, focal weakness or numbness. 10-point ROS otherwise negative.  ____________________________________________   PHYSICAL EXAM:  VITAL SIGNS: ED Triage Vitals  Enc Vitals Group     BP 01/26/16 2207 133/60 mmHg     Pulse Rate 01/26/16 2207 94     Resp 01/26/16 2207 20     Temp 01/26/16 2207 102.9 F (39.4 C)     Temp Source 01/26/16 2207 Oral     SpO2 01/26/16 2207 100 %     Weight 01/26/16 2207 169 lb (76.658 kg)     Height 01/26/16 2207  5\' 7"  (1.702 m)     Head Cir --      Peak Flow --      Pain Score 01/26/16 2208 3     Pain Loc --      Pain Edu? --      Excl. in [place]? --     Constitutional: Alert and oriented. Well appearing and in no acute distress. Eyes: Conjunctivae are normal. PERRL. EOMI. Head: Atraumatic. Nose: Positive clear significant congestion/rhinnorhea.TMs are normal, no sinus pain to palpation Mouth/Throat: Mucous membranes are moist.  Oropharynx non-erythematous. Neck: No stridor.   Nontender with no meningismus Cardiovascular: Normal rate, regular rhythm. Grossly normal heart sounds.  Good  peripheral circulation. Respiratory: Normal respiratory effort.  No retractions. Lungs CTAB. Abdominal: Soft and nontender. No distention. No guarding no rebound Back:  There is no focal tenderness or step off there is no midline tenderness there are no lesions noted. there is no CVA tenderness Musculoskeletal: No lower extremity tenderness. No joint effusions, no DVT signs strong distal pulses no edema Neurologic:  Normal speech and language. No gross focal neurologic deficits are appreciated.  Skin:  Skin is warm, dry and intact. No rash noted. Psychiatric: Mood and affect are normal. Speech and behavior are normal.  ____________________________________________   LABS (all labs ordered are listed, but only abnormal results are displayed)  Labs Reviewed  BASIC METABOLIC PANEL - Abnormal; Notable for the following:    Glucose, Bld 117 (*)    Anion gap 3 (*)    All other components within normal limits  URINALYSIS COMPLETEWITH MICROSCOPIC ([facility] ONLY) - Abnormal; Notable for the following:    Color, Urine YELLOW (*)    APPearance CLEAR (*)    Hgb urine dipstick 2+ (*)    All other components within normal limits  RAPID INFLUENZA A&B ANTIGENS ([facility] ONLY)  CBC  TROPONIN I   ____________________________________________  EKG  I personally interpreted any EKGs ordered by me or triage Sinus rhythm rate 93 bpm, partial right bundle-branch block noted, no acute ST elevation or depression ____________________________________________  RADIOLOGY  I reviewed any imaging ordered by me or triage that were performed during my shift and, if possible, patient and/or family made aware of any abnormal findings. ____________________________________________   PROCEDURES  Procedure(s) performed: None  Critical Care performed: None  ____________________________________________   INITIAL IMPRESSION / ASSESSMENT AND PLAN / ED COURSE  Pertinent labs & imaging results that were available  during my care of the patient were reviewed by me and considered in my medical decision making (see chart for details).  Patient with flulike symptoms although her influenza test is negative there are many false negatives with that, this patient likely has the flu. If not, certainly a viral syndrome consistent with that. We will treat her with continued supportive care at home. Patient is not wheezing with for not to have an inhaler. No evidence of pneumonia. Workup is negative here. White count is reassuring. Extensive return precautions and follow-up given and understood. ____________________________________________   FINAL CLINICAL IMPRESSION(S) / ED DIAGNOSES  Final diagnoses:  None      This chart was dictated using voice recognition software.  Despite best efforts to proofread,  errors can occur which can change meaning.     Schuyler Amor, MD 01/27/16 0300

## 2016-01-27 NOTE — Discharge Instructions (Signed)
Upper Respiratory Infection, Adult Most upper respiratory infections (URIs) are a viral infection of the air passages leading to the lungs. A URI affects the nose, throat, and upper air passages. The most common type of URI is nasopharyngitis and is typically referred to as "the common cold." URIs run their course and usually go away on their own. Most of the time, a URI does not require medical attention, but sometimes a bacterial infection in the upper airways can follow a viral infection. This is called a secondary infection. Sinus and middle ear infections are common types of secondary upper respiratory infections. Bacterial pneumonia can also complicate a URI. A URI can worsen asthma and chronic obstructive pulmonary disease (COPD). Sometimes, these complications can require emergency medical care and may be life threatening.  CAUSES Almost all URIs are caused by viruses. A virus is a type of germ and can spread from one person to another.  RISKS FACTORS You may be at risk for a URI if:   You smoke.   You have chronic heart or lung disease.  You have a weakened defense (immune) system.   You are very young or very old.   You have nasal allergies or asthma.  You work in crowded or poorly ventilated areas.  You work in health care facilities or schools. SIGNS AND SYMPTOMS  Symptoms typically develop 2-3 days after you come in contact with a cold virus. Most viral URIs last 7-10 days. However, viral URIs from the influenza virus (flu virus) can last 14-18 days and are typically more severe. Symptoms may include:   Runny or stuffy (congested) nose.   Sneezing.   Cough.   Sore throat.   Headache.   Fatigue.   Fever.   Loss of appetite.   Pain in your forehead, behind your eyes, and over your cheekbones (sinus pain).  Muscle aches.  DIAGNOSIS  Your health care provider may diagnose a URI by:  Physical exam.  Tests to check that your symptoms are not due to  another condition such as:  Strep throat.  Sinusitis.  Pneumonia.  Asthma. TREATMENT  A URI goes away on its own with time. It cannot be cured with medicines, but medicines may be prescribed or recommended to relieve symptoms. Medicines may help:  Reduce your fever.  Reduce your cough.  Relieve nasal congestion. HOME CARE INSTRUCTIONS   Take medicines only as directed by your health care provider.   Gargle warm saltwater or take cough drops to comfort your throat as directed by your health care provider.  Use a warm mist humidifier or inhale steam from a shower to increase air moisture. This may make it easier to breathe.  Drink enough fluid to keep your urine clear or pale yellow.   Eat soups and other clear broths and maintain good nutrition.   Rest as needed.   Return to work when your temperature has returned to normal or as your health care provider advises. You may need to stay home longer to avoid infecting others. You can also use a face mask and careful hand washing to prevent spread of the virus.  Increase the usage of your inhaler if you have asthma.   Do not use any tobacco products, including cigarettes, chewing tobacco, or electronic cigarettes. If you need help quitting, ask your health care provider. PREVENTION  The best way to protect yourself from getting a cold is to practice good hygiene.   Avoid oral or hand contact with people with cold   symptoms.   Wash your hands often if contact occurs.  There is no clear evidence that vitamin C, vitamin E, echinacea, or exercise reduces the chance of developing a cold. However, it is always recommended to get plenty of rest, exercise, and practice good nutrition.  SEEK MEDICAL CARE IF:   You are getting worse rather than better.   Your symptoms are not controlled by medicine.   You have chills.  You have worsening shortness of breath.  You have brown or red mucus.  You have yellow or brown nasal  discharge.  You have pain in your face, especially when you bend forward.  You have a fever.  You have swollen neck glands.  You have pain while swallowing.  You have white areas in the back of your throat. SEEK IMMEDIATE MEDICAL CARE IF:   You have severe or persistent:  Headache.  Ear pain.  Sinus pain.  Chest pain.  You have chronic lung disease and any of the following:  Wheezing.  Prolonged cough.  Coughing up blood.  A change in your usual mucus.  You have a stiff neck.  You have changes in your:  Vision.  Hearing.  Thinking.  Mood. MAKE SURE YOU:   Understand these instructions.  Will watch your condition.  Will get help right away if you are not doing well or get worse.   This information is not intended to replace advice given to you by your health care provider. Make sure you discuss any questions you have with your health care provider.   Document Released: 04/22/2001 Document Revised: 03/13/2015 Document Reviewed: 02/01/2014 Elsevier Interactive Patient Education 2016 [employer]  

## 2016-03-17 ENCOUNTER — Ambulatory Visit: Payer: Self-pay | Admitting: Internal Medicine

## 2016-03-27 DIAGNOSIS — Z1283 Encounter for screening for malignant neoplasm of skin: Secondary | ICD-10-CM | POA: Diagnosis not present

## 2016-03-27 DIAGNOSIS — L578 Other skin changes due to chronic exposure to nonionizing radiation: Secondary | ICD-10-CM | POA: Diagnosis not present

## 2016-03-27 DIAGNOSIS — D485 Neoplasm of uncertain behavior of skin: Secondary | ICD-10-CM | POA: Diagnosis not present

## 2016-03-27 DIAGNOSIS — Z85828 Personal history of other malignant neoplasm of skin: Secondary | ICD-10-CM | POA: Diagnosis not present

## 2016-03-27 DIAGNOSIS — L812 Freckles: Secondary | ICD-10-CM | POA: Diagnosis not present

## 2016-03-27 DIAGNOSIS — D1801 Hemangioma of skin and subcutaneous tissue: Secondary | ICD-10-CM | POA: Diagnosis not present

## 2016-03-27 DIAGNOSIS — D225 Melanocytic nevi of trunk: Secondary | ICD-10-CM | POA: Diagnosis not present

## 2016-03-27 DIAGNOSIS — D239 Other benign neoplasm of skin, unspecified: Secondary | ICD-10-CM

## 2016-03-27 DIAGNOSIS — L821 Other seborrheic keratosis: Secondary | ICD-10-CM | POA: Diagnosis not present

## 2016-03-27 DIAGNOSIS — D229 Melanocytic nevi, unspecified: Secondary | ICD-10-CM | POA: Diagnosis not present

## 2016-03-27 HISTORY — DX: Other benign neoplasm of skin, unspecified: D23.9

## 2016-04-18 ENCOUNTER — Telehealth: Payer: Self-pay | Admitting: Internal Medicine

## 2016-04-18 MED ORDER — OMEPRAZOLE 20 MG PO CPDR: 20.0000 mg | DELAYED_RELEASE_CAPSULE | Freq: Every day | ORAL | Status: DC

## 2016-04-18 NOTE — Telephone Encounter (Signed)
Rx sent and patient is aware. 

## 2016-04-18 NOTE — Telephone Encounter (Signed)
Pt lvm stating that she needs a refill of her omeprazole 20 mg sent to [facility] by mail. Please call pt once submitted.

## 2016-04-25 ENCOUNTER — Ambulatory Visit (INDEPENDENT_AMBULATORY_CARE_PROVIDER_SITE_OTHER): Payer: Medicare Other | Admitting: Internal Medicine

## 2016-04-25 ENCOUNTER — Encounter: Payer: Self-pay | Admitting: Internal Medicine

## 2016-04-25 VITALS — BP 97/57 | HR 64 | Temp 97.8°F | Resp 18 | Ht 67.5 in | Wt 165.2 lb

## 2016-04-25 DIAGNOSIS — G4730 Sleep apnea, unspecified: Secondary | ICD-10-CM | POA: Diagnosis not present

## 2016-04-25 DIAGNOSIS — E7800 Pure hypercholesterolemia, unspecified: Secondary | ICD-10-CM

## 2016-04-25 DIAGNOSIS — F32A Depression, unspecified: Secondary | ICD-10-CM

## 2016-04-25 DIAGNOSIS — F329 Major depressive disorder, single episode, unspecified: Secondary | ICD-10-CM | POA: Diagnosis not present

## 2016-04-25 LAB — LIPID PANEL
CHOLESTEROL: 189 mg/dL (ref 0–200)
HDL: 38.4 mg/dL — ABNORMAL LOW (ref >39.00)
LDL CALC: 132 mg/dL — AB (ref 0–99)
NONHDL: 150.1
Total CHOL/HDL Ratio: 5
Triglycerides: 92 mg/dL (ref 0.0–149.0)
VLDL: 18.4 mg/dL (ref 0.0–40.0)

## 2016-04-25 LAB — COMPREHENSIVE METABOLIC PANEL
ALT: 16 U/L (ref 0–35)
AST: 20 U/L (ref 0–37)
Albumin: 4.3 g/dL (ref 3.5–5.2)
Alkaline Phosphatase: 63 U/L (ref 39–117)
BUN: 12 mg/dL (ref 6–23)
CHLORIDE: 104 meq/L (ref 96–112)
CO2: 30 meq/L (ref 19–32)
CREATININE: 0.76 mg/dL (ref 0.40–1.20)
Calcium: 10.6 mg/dL — ABNORMAL HIGH (ref 8.4–10.5)
GFR: 80.6 mL/min (ref >60.00)
GLUCOSE: 93 mg/dL (ref 70–99)
POTASSIUM: 4.2 meq/L (ref 3.5–5.1)
SODIUM: 137 meq/L (ref 135–145)
Total Bilirubin: 0.5 mg/dL (ref 0.2–1.2)
Total Protein: 7.4 g/dL (ref 6.0–8.3)

## 2016-04-25 NOTE — Progress Notes (Signed)
[name], female   DOB: [birth year], 65 y.o.   MRN: [id number]   Subjective:    [name], female    DOB: [birth year], 65 y.o.   MRN: [id number]  HPI  Patient here for a scheduled follow up.  She states she is doing well.  Some increased stress related to some family issues.  Her daughter is having marital problems.  She feels she is handling stress well.  Tries to stay active.  No cardiac symptoms with increased activity or exertion.  No acid reflux reported.  No abdominal pain or cramping.  Bowels stable.     Past Medical History  Diagnosis Date  . Depression   . Allergy   . GERD (gastroesophageal reflux disease)   . Lyme disease     history  . ADD (attention deficit disorder)    Past Surgical History  Procedure Laterality Date  . Tubal ligation  1978    bilateral  . Wisdom tooth extraction  1980  . Breast biopsy Left 20 yrs ago   Family History  Problem Relation Age of Onset  . Uterine cancer Mother   . Heart disease Maternal Grandmother   . Alcohol abuse Maternal Grandfather   . Parkinson's disease Paternal Grandmother   . Depression Paternal Grandfather   . Breast cancer Neg Hx    Social History   Social History  . Marital Status: Widowed    Spouse Name: N/A  . Number of Children: 2  . Years of Education: N/A   Social History Main Topics  . Smoking status: Never Smoker   . Smokeless tobacco: Never Used  . Alcohol Use: 0.0 oz/week    0 Standard drinks or equivalent per week     Comment: occasional  . Drug Use: No  . Sexual Activity: Not Asked   Other Topics Concern  . None   Social History Narrative    Outpatient Encounter Prescriptions as of 04/25/2016  Medication Sig  . omeprazole (PRILOSEC) 20 MG capsule Take 1 capsule (20 mg total) by mouth daily.  Marland Kitchen venlafaxine XR (EFFEXOR-XR) 37.5 MG 24 hr capsule Take 1 capsule (37.5 mg total) by mouth daily.   No facility-administered encounter medications on file as of  04/25/2016.    Review of Systems     Objective:     Blood pressure rechecked by me:  114/68  Physical Exam  Constitutional: She appears well-developed and well-nourished. No distress.  HENT:  Nose: Nose normal.  Mouth/Throat: Oropharynx is clear and moist.  Neck: Neck supple. No thyromegaly present.  Cardiovascular: Normal rate and regular rhythm.   DP pulses palpable and equal bilateral.   Pulmonary/Chest: Breath sounds normal. No respiratory distress. She has no wheezes.  Abdominal: Soft. Bowel sounds are normal. There is no tenderness.  Musculoskeletal: She exhibits no edema or tenderness.  Lymphadenopathy:    She has no cervical adenopathy.  Skin: No rash noted. No erythema.    BP 97/57 mmHg  Pulse 64  Temp(Src) 97.8 F (36.6 C) (Oral)  Resp 18  Ht 5' 7.5" (1.715 m)  Wt 165 lb 4 oz (74.957 kg)  BMI 25.48 kg/m2  SpO2 97%  LMP 07/16/1995 Wt Readings from Last 3 Encounters:  04/25/16 165 lb 4 oz (74.957 kg)  01/26/16 169 lb (76.658 kg)  09/17/15 174 lb 8 oz (79.153 kg)     Lab Results  Component Value Date   WBC 6.0 01/26/2016   HGB 12.7 01/26/2016   HCT  36.7 01/26/2016   PLT 189 01/26/2016   GLUCOSE 93 04/25/2016   CHOL 189 04/25/2016   TRIG 92.0 04/25/2016   HDL 38.40* 04/25/2016   LDLDIRECT 131.4 08/17/2014   LDLCALC 132* 04/25/2016   ALT 16 04/25/2016   AST 20 04/25/2016   NA 137 04/25/2016   K 4.2 04/25/2016   CL 104 04/25/2016   CREATININE 0.76 04/25/2016   BUN 12 04/25/2016   CO2 30 04/25/2016   TSH 1.87 10/12/2015       Assessment & Plan:   Problem List Items Addressed This Visit    Depression    Doing well on effexor.  Follow.        Hypercholesterolemia - Primary    Low cholesterol diet and exercise.  Follow lipid panel.        Relevant Orders   Lipid panel (Completed)   Comprehensive metabolic panel (Completed)   Sleep apnea    CPAP.        Other Visit Diagnoses    Hypercalcemia            [name], MD

## 2016-04-25 NOTE — Progress Notes (Signed)
Pre-visit discussion using our clinic review tool. No additional management support is needed unless otherwise documented below in the visit note.  

## 2016-04-29 ENCOUNTER — Encounter: Payer: Self-pay | Admitting: *Deleted

## 2016-04-29 ENCOUNTER — Other Ambulatory Visit: Payer: Self-pay | Admitting: Internal Medicine

## 2016-04-29 NOTE — Progress Notes (Signed)
Order placed for f/u calcium.  

## 2016-05-01 ENCOUNTER — Encounter: Payer: Self-pay | Admitting: Internal Medicine

## 2016-05-01 NOTE — Assessment & Plan Note (Signed)
CPAP.  

## 2016-05-01 NOTE — Assessment & Plan Note (Signed)
Doing well on effexor.  Follow.

## 2016-05-01 NOTE — Assessment & Plan Note (Signed)
Low cholesterol diet and exercise.  Follow lipid panel.   

## 2016-05-02 NOTE — Telephone Encounter (Signed)
Unread mychart message & lab appt mailed to patient 

## 2016-05-14 ENCOUNTER — Other Ambulatory Visit: Payer: Self-pay

## 2016-05-14 ENCOUNTER — Other Ambulatory Visit (INDEPENDENT_AMBULATORY_CARE_PROVIDER_SITE_OTHER): Payer: Medicare Other

## 2016-05-15 LAB — CALCIUM: CALCIUM: 10.5 mg/dL (ref 8.4–10.5)

## 2016-05-16 ENCOUNTER — Encounter: Payer: Self-pay | Admitting: Internal Medicine

## 2016-05-19 NOTE — Telephone Encounter (Signed)
Unread mychart message mailed to patient 

## 2016-07-14 ENCOUNTER — Encounter: Payer: Self-pay | Admitting: Internal Medicine

## 2016-08-28 DIAGNOSIS — Z23 Encounter for immunization: Secondary | ICD-10-CM | POA: Diagnosis not present

## 2016-10-17 ENCOUNTER — Other Ambulatory Visit: Payer: Self-pay

## 2016-10-28 ENCOUNTER — Ambulatory Visit (INDEPENDENT_AMBULATORY_CARE_PROVIDER_SITE_OTHER): Payer: Medicare Other

## 2016-10-28 VITALS — BP 122/68 | HR 66 | Temp 98.1°F | Resp 12 | Ht 67.0 in | Wt 169.6 lb

## 2016-10-28 DIAGNOSIS — Z0000 Encounter for general adult medical examination without abnormal findings: Secondary | ICD-10-CM

## 2016-10-28 DIAGNOSIS — E2839 Other primary ovarian failure: Secondary | ICD-10-CM | POA: Diagnosis not present

## 2016-10-28 DIAGNOSIS — Z1231 Encounter for screening mammogram for malignant neoplasm of breast: Secondary | ICD-10-CM | POA: Diagnosis not present

## 2016-10-28 DIAGNOSIS — Z1239 Encounter for other screening for malignant neoplasm of breast: Secondary | ICD-10-CM

## 2016-10-28 NOTE — Patient Instructions (Addendum)
Ann Osborne , Thank you for taking time to come for your Medicare Wellness Visit. I appreciate your ongoing commitment to your health goals. Please review the following plan we discussed and let me know if I can assist you in the future.   Bone Density as directed. Mammogram as directed.  Follow up with Dr. Nicki Reaper as needed.  These are the goals we discussed: Goals    . Increase physical activity          Walk 3 days a week for 30 minutes        This is a list of the screening recommended for you and due dates:  Health Maintenance  Topic Date Due  . Tetanus Vaccine  01/01/1968  . Shingles Vaccine  12/31/2008  . DEXA scan (bone density measurement)  12/31/2013  . Pneumonia vaccines (2 of 2 - PPSV23) 08/19/2014  . Mammogram  10/14/2016  . Colon Cancer Screening  01/14/2025  . Flu Shot  Completed  .  Hepatitis C: One time screening is recommended by Center for Disease Control  (CDC) for  adults born from [birth year] through 1967.   Completed     Bone Densitometry Introduction Bone densitometry is an imaging test that uses a special X-ray to measure the amount of calcium and other minerals in your bones (bone density). This test is also known as a bone mineral density test or dual-energy X-ray absorptiometry (DXA). The test can measure bone density at your hip and your spine. It is similar to having a regular X-ray. You may have this test to:  Diagnose a condition that causes weak or thin bones (osteoporosis).  Predict your risk of a broken bone (fracture).  Determine how well osteoporosis treatment is working. Tell a health care provider about:  Any allergies you have.  All medicines you are taking, including vitamins, herbs, eye drops, creams, and over-the-counter medicines.  Any problems you or family members have had with anesthetic medicines.  Any blood disorders you have.  Any surgeries you have had.  Any medical conditions you have.  Possibility of  pregnancy.  Any other medical test you had within the previous 14 days that used contrast material. What are the risks? Generally, this is a safe procedure. However, problems can occur and may include the following:  This test exposes you to a very small amount of radiation.  The risks of radiation exposure may be greater to unborn children. What happens before the procedure?  Do not take any calcium supplements for 24 hours before having the test. You can otherwise eat and drink what you usually do.  Take off all metal jewelry, eyeglasses, dental appliances, and any other metal objects. What happens during the procedure?  You may lie on an exam table. There will be an X-ray generator below you and an imaging device above you.  Other devices, such as boxes or braces, may be used to position your body properly for the scan.  You will need to lie still while the machine slowly scans your body.  The images will show up on a computer monitor. What happens after the procedure? You may need more testing at a later time. This information is not intended to replace advice given to you by your health care provider. Make sure you discuss any questions you have with your health care provider. Document Released: 11/18/2004 Document Revised: 04/03/2016 Document Reviewed: 04/06/2014  2017 Elsevier   Mammogram A mammogram is an X-ray of the breasts that is done  to check for abnormal changes. This procedure can screen for and detect any changes that may suggest breast cancer. A mammogram can also identify other changes and variations in the breast, such as:  Inflammation of the breast tissue (mastitis).  An infected area that contains a collection of pus (abscess).  A fluid-filled sac (cyst).  Fibrocystic changes. This is when breast tissue becomes denser, which can make the tissue feel rope-like or uneven under the skin.  Tumors that are not cancerous (benign). Tell a health care provider  about:  Any allergies you have.  If you have breast implants.  If you have had previous breast disease, biopsy, or surgery.  If you are breastfeeding.  Any possibility that you could be pregnant, if this applies.  If you are younger than age 16.  If you have a family history of breast cancer. What are the risks? Generally, this is a safe procedure. However, problems may occur, including:  Exposure to radiation. Radiation levels are very low with this test.  The results being misinterpreted.  The need for further tests.  The inability of the mammogram to detect certain cancers. What happens before the procedure?  Schedule your test about 1-2 weeks after your menstrual period. This is usually when your breasts are the least tender.  If you have had a mammogram done at a different facility in the past, get the mammogram X-rays or have them sent to your current exam facility in order to compare them.  Wash your breasts and under your arms the day of the test.  Do not wear deodorants, perfumes, lotions, or powders anywhere on your body on the day of the test.  Remove any jewelry from your neck.  Wear clothes that you can change into and out of easily. What happens during the procedure?  You will undress from the waist up and put on a gown.  You will stand in front of the X-ray machine.  Each breast will be placed between two plastic or glass plates. The plates will compress your breast for a few seconds. Try to stay as relaxed as possible during the procedure. This does not cause any harm to your breasts and any discomfort you feel will be very brief.  X-rays will be taken from different angles of each breast. The procedure may vary among health care providers and hospitals. What happens after the procedure?  The mammogram will be examined by a specialist (radiologist).  You may need to repeat certain parts of the test, depending on the quality of the images. This is  commonly done if the radiologist needs a better view of the breast tissue.  Ask when your test results will be ready. Make sure you get your test results.  You may resume your normal activities. This information is not intended to replace advice given to you by your health care provider. Make sure you discuss any questions you have with your health care provider. Document Released: 10/24/2000 Document Revised: 03/31/2016 Document Reviewed: 01/05/2015 Elsevier Interactive Patient Education  2017 [employer].

## 2016-10-28 NOTE — Progress Notes (Signed)
Subjective:   Ann Osborne is a 65 y.o. female who presents for Medicare Annual (Subsequent) preventive examination.  Review of Systems:  No ROS.  Medicare Wellness Visit. Cardiac Risk Factors include: advanced age (>93men, >20 women)     Objective:     Vitals: BP 122/68 (BP Location: Right Arm, Patient Position: Sitting, Cuff Size: Normal)   Pulse 66   Temp 98.1 F (36.7 C) (Oral)   Resp 12   Ht 5\' 7"  (1.702 m)   Wt 169 lb 9.6 oz (76.9 kg)   LMP 07/16/1995   SpO2 97%   BMI 26.56 kg/m   Body mass index is 26.56 kg/m.   Tobacco History  Smoking Status  . Never Smoker  Smokeless Tobacco  . Never Used     Counseling given: Not Answered   Past Medical History:  Diagnosis Date  . ADD (attention deficit disorder)   . Allergy   . Depression   . GERD (gastroesophageal reflux disease)   . Lyme disease    history   Past Surgical History:  Procedure Laterality Date  . BREAST BIOPSY Left 20 yrs ago  . TUBAL LIGATION  1978   bilateral  . WISDOM TOOTH EXTRACTION  1980   Family History  Problem Relation Age of Onset  . Uterine cancer Mother   . Heart disease Maternal Grandmother   . Alcohol abuse Maternal Grandfather   . Parkinson's disease Paternal Grandmother   . Depression Paternal Grandfather   . Breast cancer Neg Hx    History  Sexual Activity  . Sexual activity: Yes    Outpatient Encounter Prescriptions as of 10/28/2016  Medication Sig  . omeprazole (PRILOSEC) 20 MG capsule Take 1 capsule (20 mg total) by mouth daily.  Marland Kitchen venlafaxine XR (EFFEXOR-XR) 37.5 MG 24 hr capsule Take 1 capsule (37.5 mg total) by mouth daily.   No facility-administered encounter medications on file as of 10/28/2016.     Activities of Daily Living In your present state of health, do you have any difficulty performing the following activities: 10/28/2016  Hearing? Y  Vision? N  Difficulty concentrating or making decisions? Y  Walking or climbing stairs? N  Dressing or  bathing? N  Doing errands, shopping? N  Preparing Food and eating ? N  Using the Toilet? N  In the past six months, have you accidently leaked urine? N  Do you have problems with loss of bowel control? N  Managing your Medications? N  Managing your Finances? N  Housekeeping or managing your Housekeeping? N  Some recent data might be hidden    Patient Care Team: [name], MD as PCP - General (Internal Medicine)    Assessment:    This is a routine wellness examination for Ann Osborne. The goal of the wellness visit is to assist the patient how to close the gaps in care and create a preventative care plan for the patient.   Osteoporosis risk reviewed.  Bone Density ordered; follow as directed.  Educational material provided.  Medications reviewed; taking without issues or barriers.  Safety issues reviewed; lives with husband.  Smoke detectors in the home. No firearms in the home. Wears seatbelts when driving or riding with others. No violence in the home.  No identified risk were noted; The patient was oriented x 3; appropriate in dress and manner and no objective failures at ADL's or IADL's.   BMI; discussed the importance of a healthy diet, water intake and exercise. Educational material provided.  Mammogram  ordered; follow as directed.  Educational material provided.  Patient Concerns: None at this time. Follow up with PCP as needed.  Exercise Activities and Dietary recommendations Current Exercise Habits: Home exercise routine (Active in the home.  Yard work.), Type of exercise: walking, Time (Minutes): 15, Frequency (Times/Week): 1, Weekly Exercise (Minutes/Week): 15, Intensity: Mild  Goals    . Increase physical activity          Walk 3 days a week for 30 minutes       Fall Risk Fall Risk  10/28/2016 09/17/2015 03/13/2015 01/30/2014 12/15/2012  Falls in the past year? No No No No No   Depression Screen PHQ 2/9 Scores 10/28/2016 09/17/2015 03/13/2015 01/30/2014    PHQ - 2 Score 0 0 0 0     Cognitive Function     6CIT Screen 10/28/2016  What Year? 0 points  What month? 0 points  What time? 0 points  Count back from 20 0 points  Months in reverse 0 points  Repeat phrase 0 points  Total Score 0    Immunization History  Administered Date(s) Administered  . Influenza,inj,Quad PF,36+ Mos 08/25/2013, 08/02/2014  . Influenza-Unspecified 08/20/2015, 08/19/2016  . Pneumococcal Conjugate-13 08/19/2013  . Pneumococcal Polysaccharide-23 08/19/2012   Screening Tests Health Maintenance  Topic Date Due  . TETANUS/TDAP  01/01/1968  . ZOSTAVAX  12/31/2008  . DEXA SCAN  12/31/2013  . PNA vac Low Risk Adult (2 of 2 - PPSV23) 08/19/2014  . MAMMOGRAM  10/14/2016  . COLONOSCOPY  01/14/2025  . INFLUENZA VACCINE  Completed  . Hepatitis C Screening  Completed      Plan:   End of life planning; Advance aging; Advanced directives discussed. Copy of current HCPOA requested.  Medicare Attestation I have personally reviewed: The patient's medical and social history Their use of alcohol, tobacco or illicit drugs Their current medications and supplements The patient's functional ability including ADLs,fall risks, home safety risks, cognitive, and hearing and visual impairment Diet and physical activities Evidence for depression   The patient's weight, height, BMI, and visual acuity have been recorded in the chart.  I have made referrals and provided education to the patient based on review of the above and I have provided the patient with a written personalized care plan for preventive services.    During the course of the visit the patient was educated and counseled about the following appropriate screening and preventive services:   Vaccines to include Pneumoccal, Influenza, Hepatitis B, Td, Zostavax, HCV  Electrocardiogram  Cardiovascular Disease  Colorectal cancer screening  Bone density screening  Diabetes screening  Glaucoma  screening  Mammography/PAP  Nutrition counseling   Patient Instructions (the written plan) was given to the patient.   Varney Biles, LPN  [id number]   Reviewed above information.  Agree with plan.  Dr Nicki Reaper

## 2016-11-17 ENCOUNTER — Telehealth: Payer: Self-pay | Admitting: Internal Medicine

## 2016-11-17 NOTE — Telephone Encounter (Signed)
Has she contacted the company that provides her with her CPAP and supplies.  I do not mind writing rx, but if has been a while since had sleep study, insurance may require another sleep study.  Company may be able to help with what is needed to order.

## 2016-11-17 NOTE — Telephone Encounter (Signed)
Pt called and stated that her CPAP machine has malfunctioned and need Dr. Nicki Reaper to write a new prescription for a CPAP machine. Please advise, thank you!  Fax # [phone]  Call pt @ [phone]

## 2016-11-17 NOTE — Telephone Encounter (Signed)
Can you place order for new CPAP

## 2016-11-17 NOTE — Telephone Encounter (Signed)
LM for patient to return call.

## 2016-11-18 ENCOUNTER — Encounter: Payer: Self-pay | Admitting: Internal Medicine

## 2016-11-19 NOTE — Telephone Encounter (Signed)
Pt contacted the company where she receives her CPAP supplies, they requested that she have her provider fax over the prescription  Fax [phone]

## 2016-11-21 ENCOUNTER — Telehealth: Payer: Self-pay | Admitting: Internal Medicine

## 2016-11-21 MED ORDER — OMEPRAZOLE 20 MG PO CPDR: 20.0000 mg | DELAYED_RELEASE_CAPSULE | Freq: Every day | ORAL | 1 refills | Status: DC

## 2016-11-21 MED ORDER — VENLAFAXINE HCL ER 37.5 MG PO CP24: 37.5000 mg | ORAL_CAPSULE | Freq: Every day | ORAL | 1 refills | Status: DC

## 2016-11-21 NOTE — Telephone Encounter (Signed)
T called requesting refills on omeprazole (PRILOSEC) 20 MG capsule, and venlafaxine XR (EFFEXOR-XR) 37.5 MG 24 hr capsule. Please advise, thank you!  [place], [address]  Call pt @ [phone]

## 2016-11-21 NOTE — Telephone Encounter (Signed)
Spoke with patient and she is going to call back with the settings.

## 2016-11-21 NOTE — Telephone Encounter (Signed)
RX sent to pharmacy  

## 2016-11-21 NOTE — Telephone Encounter (Signed)
Need to confirm her CPAP setting.  Confirm with pt what pressure her machine is set.

## 2016-11-24 NOTE — Telephone Encounter (Signed)
Pt called back and stated that she does not see anything that says pressure the only things she sees is something EPR level and that is 3. Please advise, thank you!  Call pt @ [phone]

## 2016-11-28 NOTE — Telephone Encounter (Signed)
Does this help with the setting

## 2016-11-28 NOTE — Telephone Encounter (Signed)
The company that is telling her to get a prescription and is supplying her current machine should have her current pressure settings.  If unable to get, need to see where she had study and see if can get a copy of recommended CPAP pressure.

## 2016-12-05 NOTE — Telephone Encounter (Signed)
Ok.  Hold until we receive.

## 2016-12-05 NOTE — Telephone Encounter (Signed)
I spoke with Ann Osborne at [facility] they will be faxing pre filled order form over for you to fill in  CPAP machine  Setting 8  Fax order [phone] Patient is aware

## 2016-12-30 ENCOUNTER — Ambulatory Visit
Admission: RE | Admit: 2016-12-30 | Discharge: 2016-12-30 | Disposition: A | Discharge status: Home / Self Care | Payer: Medicare Other | Source: Ambulatory Visit | Attending: Internal Medicine | Admitting: Internal Medicine

## 2016-12-30 DIAGNOSIS — Z1231 Encounter for screening mammogram for malignant neoplasm of breast: Secondary | ICD-10-CM | POA: Insufficient documentation

## 2016-12-30 DIAGNOSIS — Z1239 Encounter for other screening for malignant neoplasm of breast: Secondary | ICD-10-CM

## 2016-12-30 DIAGNOSIS — E2839 Other primary ovarian failure: Secondary | ICD-10-CM | POA: Insufficient documentation

## 2016-12-30 DIAGNOSIS — Z780 Asymptomatic menopausal state: Secondary | ICD-10-CM | POA: Insufficient documentation

## 2016-12-31 ENCOUNTER — Encounter: Payer: Self-pay | Admitting: Internal Medicine

## 2017-01-06 NOTE — Telephone Encounter (Signed)
Left message for patient to call.

## 2017-01-06 NOTE — Telephone Encounter (Signed)
Please notify pt to schedule an appt for face to face per insurance request.  If form needs to be completed, let me know,

## 2017-01-06 NOTE — Telephone Encounter (Signed)
Per Holley Raring at [facility] order was put in at therapy in 12/15/16.  Order was re-faxed over yesterday for your review. It looks like patient is going to need a face to face encounter per CMN guidelines.

## 2017-01-08 NOTE — Telephone Encounter (Signed)
Pt scheduled  

## 2017-01-08 NOTE — Telephone Encounter (Signed)
Spoke with patient advised she would need appointment for face to face, she is agreeable to come in for visit.  Please call   [phone]

## 2017-01-08 NOTE — Telephone Encounter (Signed)
Ok to schedule appt

## 2017-01-14 ENCOUNTER — Encounter: Payer: Self-pay | Admitting: Internal Medicine

## 2017-01-14 ENCOUNTER — Ambulatory Visit (INDEPENDENT_AMBULATORY_CARE_PROVIDER_SITE_OTHER): Payer: Medicare Other | Admitting: Internal Medicine

## 2017-01-14 DIAGNOSIS — F329 Major depressive disorder, single episode, unspecified: Secondary | ICD-10-CM | POA: Diagnosis not present

## 2017-01-14 DIAGNOSIS — R1310 Dysphagia, unspecified: Secondary | ICD-10-CM

## 2017-01-14 DIAGNOSIS — K5900 Constipation, unspecified: Secondary | ICD-10-CM

## 2017-01-14 DIAGNOSIS — E7800 Pure hypercholesterolemia, unspecified: Secondary | ICD-10-CM | POA: Diagnosis not present

## 2017-01-14 DIAGNOSIS — G4730 Sleep apnea, unspecified: Secondary | ICD-10-CM | POA: Diagnosis not present

## 2017-01-14 DIAGNOSIS — F32A Depression, unspecified: Secondary | ICD-10-CM

## 2017-01-14 NOTE — Patient Instructions (Signed)
nasacort nasal spray (or flonase) - 2 sprays each nostril one time per day.  Do this in the evening.

## 2017-01-14 NOTE — Progress Notes (Signed)
[name], female   DOB: 1951/05/27, 66 y.o.   MRN: [id number]   Subjective:    [name], female    DOB: 02/12/1951, 66 y.o.   MRN: [id number]  HPI  Patient here for a scheduled follow up.  States overall she is doing relatively well.  Tries to stay active.  No chest pain.  No sob.  Has noticed gets choked with eating.  This has been a persistent intermittent issue.  No acid reflux.  Takes omeprazole.  No abdominal pain or cramping.  Bowels stable.  No urine change.  She was using cpap regularly.  With the cpap, she was sleeping well.  Felt more rested.  Since her machine broke, she has not been able to sleep as well.  Wakes up tired.  Increased daytime somnolence.  Needs a new machine.     Past Medical History:  Diagnosis Date  . ADD (attention deficit disorder)   . Allergy   . Depression   . GERD (gastroesophageal reflux disease)   . Lyme disease    history   Past Surgical History:  Procedure Laterality Date  . BREAST BIOPSY Left 20 yrs ago  . TUBAL LIGATION  1978   bilateral  . WISDOM TOOTH EXTRACTION  1980   Family History  Problem Relation Age of Onset  . Uterine cancer Mother   . Heart disease Maternal Grandmother   . Alcohol abuse Maternal Grandfather   . Parkinson's disease Paternal Grandmother   . Depression Paternal Grandfather   . Breast cancer Neg Hx    Social History   Social History  . Marital status: Widowed    Spouse name: N/A  . Number of children: 2  . Years of education: N/A   Social History Main Topics  . Smoking status: Never Smoker  . Smokeless tobacco: Never Used  . Alcohol use 0.0 oz/week     Comment: occasional  . Drug use: No  . Sexual activity: Yes   Other Topics Concern  . None   Social History Narrative  . None    Outpatient Encounter Prescriptions as of 01/14/2017  Medication Sig  . omeprazole (PRILOSEC) 20 MG capsule Take 1 capsule (20 mg total) by mouth daily.  Marland Kitchen venlafaxine XR (EFFEXOR-XR)  37.5 MG 24 hr capsule Take 1 capsule (37.5 mg total) by mouth daily.   No facility-administered encounter medications on file as of 01/14/2017.     Review of Systems  Constitutional: Negative for appetite change and unexpected weight change.  HENT: Negative for congestion and sinus pressure.   Respiratory: Negative for cough, chest tightness and shortness of breath.   Cardiovascular: Negative for chest pain, palpitations and leg swelling.  Gastrointestinal: Negative for abdominal pain, diarrhea, nausea and vomiting.  Genitourinary: Negative for difficulty urinating and dysuria.  Musculoskeletal: Negative for back pain and joint swelling.  Skin: Negative for color change and rash.  Neurological: Negative for dizziness, light-headedness and headaches.  Psychiatric/Behavioral: Negative for agitation and dysphoric mood.       Objective:    Physical Exam  Constitutional: She appears well-developed and well-nourished. No distress.  HENT:  Nose: Nose normal.  Mouth/Throat: Oropharynx is clear and moist.  Neck: Neck supple. No thyromegaly present.  Cardiovascular: Normal rate and regular rhythm.   Pulmonary/Chest: Breath sounds normal. No respiratory distress. She has no wheezes.  Abdominal: Soft. Bowel sounds are normal. There is no tenderness.  Musculoskeletal: She exhibits no edema or tenderness.  Lymphadenopathy:  She has no cervical adenopathy.  Skin: No rash noted. No erythema.  Psychiatric: She has a normal mood and affect. Her behavior is normal.    BP 110/68 (BP Location: Left Arm, Patient Position: Sitting, Cuff Size: Large)   Pulse 61   Temp 98.8 F (37.1 C) (Oral)   Resp 18   Ht 5\' 7"  (1.702 m)   Wt 166 lb 9.6 oz (75.6 kg)   LMP 07/16/1995   SpO2 96%   BMI 26.09 kg/m  Wt Readings from Last 3 Encounters:  01/14/17 166 lb 9.6 oz (75.6 kg)  10/28/16 169 lb 9.6 oz (76.9 kg)  04/25/16 165 lb 4 oz (75 kg)     Lab Results  Component Value Date   WBC 6.0  01/26/2016   HGB 12.7 01/26/2016   HCT 36.7 01/26/2016   PLT 189 01/26/2016   GLUCOSE 93 04/25/2016   CHOL 189 04/25/2016   TRIG 92.0 04/25/2016   HDL 38.40 (L) 04/25/2016   LDLDIRECT 131.4 08/17/2014   LDLCALC 132 (H) 04/25/2016   ALT 16 04/25/2016   AST 20 04/25/2016   NA 137 04/25/2016   K 4.2 04/25/2016   CL 104 04/25/2016   CREATININE 0.76 04/25/2016   BUN 12 04/25/2016   CO2 30 04/25/2016   TSH 1.87 10/12/2015    Dexascan  Result Date: 12/30/2016 EXAM: DUAL X-RAY ABSORPTIOMETRY (DXA) FOR BONE MINERAL DENSITY IMPRESSION: Dear Dr. Nicki Reaper, Your patient [name] completed a BMD test on 12/30/2016 using the [place] (analysis version: 14.10) manufactured by [employer]. The following summarizes the results of our evaluation. PATIENT BIOGRAPHICAL: Name: Ann Osborne, Ann Osborne Patient ID: [id number] Birth Date: 11-Feb-1951 Height: 66.5 in. Gender: Female Exam Date: 12/30/2016 Weight: 163.0 lbs. Indications: Caucasian, Postmenopausal Fractures: Treatments: ASSESSMENT: The BMD measured at Femur Neck Left is 0.947 g/cm2 with a T-score of -0.7. This patient is considered normal according to [facility]) criteria. L-3 was excluded due to degenerative changes. Site Region Measured Measured WHO Young Adult BMD Date       Age      Classification T-score AP Spine L1-L4 (L3) 12/30/[age] 67.9 Normal 1.9 1.417 g/cm2 DualFemur Neck Left 12/30/2016 67.9 Normal -0.7 0.947 g/cm2 World Health Organization [facility]) criteria for post-menopausal, Caucasian Women: Normal:       T-score at or above -1 SD Osteopenia:   T-score between -1 and -2.5 SD Osteoporosis: T-score at or below -2.5 SD RECOMMENDATIONS: [place] recommends that FDA-approved medical therapies be considered in postmenopausal women and men age 57 or older with a: 1. Hip or vertebral (clinical or morphometric) fracture. 2. T-score of < -2.5 at the spine or hip. 3. Ten-year fracture probability by FRAX  of 3% or greater for hip fracture or 20% or greater for major osteoporotic fracture. All treatment decisions require clinical judgment and consideration of individual patient factors, including patient preferences, co-morbidities, previous drug use, risk factors not captured in the FRAX model (e.g. falls, vitamin D deficiency, increased bone turnover, interval significant decline in bone density) and possible under - or over-estimation of fracture risk by FRAX. All patients should ensure an adequate intake of dietary calcium (1200 mg/d) and vitamin D (800 IU daily) unless contraindicated. FOLLOW-UP: People with diagnosed cases of osteoporosis or at high risk for fracture should have regular bone mineral density tests. For patients eligible for Medicare, routine testing is allowed once every 2 years. The testing frequency can be increased to one year for patients who have rapidly  progressing disease, those who are receiving or discontinuing medical therapy to restore bone mass, or have additional risk factors. I have reviewed this report, and agree with the above findings. [facility] Electronically Signed   By: Lajean Manes M.D.   On: 12/30/2016 15:38   Mammogram Digital Screening  Result Date: 12/30/2016 CLINICAL DATA:  Screening. EXAM: DIGITAL SCREENING BILATERAL MAMMOGRAM WITH CAD COMPARISON:  Previous exam(s). ACR Breast Density Category b: There are scattered areas of fibroglandular density. FINDINGS: There are no findings suspicious for malignancy. Images were processed with CAD. IMPRESSION: No mammographic evidence of malignancy. A result letter of this screening mammogram will be mailed directly to the patient. RECOMMENDATION: Screening mammogram in one year. (Code:SM-B-01Y) BI-RADS CATEGORY  1: Negative. Electronically Signed   By: Ammie Ferrier M.D.   On: 12/30/2016 15:45       Assessment & Plan:   Problem List Items Addressed This Visit    Constipation    Better with fiber.   Follow.        Depression    Doing well on effexor.  Stable.        Dysphagia    Having trouble with getting choked while eating.  On omeprazole.  No acid reflux.  Will obtain barium swallow with UGI.        Hypercholesterolemia    Low cholesterol diet and exercise.  Follow lipid panel.        Relevant Orders   TSH   Lipid panel   Sleep apnea    Was using cpap regularly.  Felt better.  Slept better.  Machine broke.  Now with increased fatigue and daytime somnolence.  Not sleeping as well.  Needs a new machine.        Relevant Orders   CBC with Differential/Platelet   Comprehensive metabolic panel       [name], MD

## 2017-01-14 NOTE — Progress Notes (Signed)
Pre-visit discussion using our clinic review tool. No additional management support is needed unless otherwise documented below in the visit note.  

## 2017-01-19 ENCOUNTER — Encounter: Payer: Self-pay | Admitting: Internal Medicine

## 2017-01-19 DIAGNOSIS — R1310 Dysphagia, unspecified: Secondary | ICD-10-CM | POA: Insufficient documentation

## 2017-01-19 NOTE — Assessment & Plan Note (Signed)
Doing well on effexor.  Stable.

## 2017-01-19 NOTE — Assessment & Plan Note (Signed)
Was using cpap regularly.  Felt better.  Slept better.  Machine broke.  Now with increased fatigue and daytime somnolence.  Not sleeping as well.  Needs a new machine.

## 2017-01-19 NOTE — Assessment & Plan Note (Signed)
Having trouble with getting choked while eating.  On omeprazole.  No acid reflux.  Will obtain barium swallow with UGI.

## 2017-01-19 NOTE — Assessment & Plan Note (Signed)
Low cholesterol diet and exercise.  Follow lipid panel.   

## 2017-01-19 NOTE — Assessment & Plan Note (Signed)
Better with fiber.  Follow.

## 2017-01-26 ENCOUNTER — Other Ambulatory Visit: Payer: Self-pay | Admitting: Internal Medicine

## 2017-01-26 DIAGNOSIS — R1310 Dysphagia, unspecified: Secondary | ICD-10-CM

## 2017-01-26 NOTE — Progress Notes (Signed)
Order placed for UGI with barium swallow.   

## 2017-01-30 ENCOUNTER — Ambulatory Visit
Admission: RE | Admit: 2017-01-30 | Discharge: 2017-01-30 | Disposition: A | Discharge status: Home / Self Care | Payer: Medicare Other | Source: Ambulatory Visit | Attending: Internal Medicine | Admitting: Internal Medicine

## 2017-01-30 ENCOUNTER — Encounter: Payer: Self-pay | Admitting: Internal Medicine

## 2017-01-30 DIAGNOSIS — K219 Gastro-esophageal reflux disease without esophagitis: Secondary | ICD-10-CM | POA: Diagnosis not present

## 2017-01-30 DIAGNOSIS — R1310 Dysphagia, unspecified: Secondary | ICD-10-CM | POA: Diagnosis present

## 2017-02-03 NOTE — Telephone Encounter (Signed)
Copy of message mailed to patients home address

## 2017-04-15 ENCOUNTER — Other Ambulatory Visit: Payer: Self-pay

## 2017-04-17 ENCOUNTER — Encounter: Payer: Self-pay | Admitting: Internal Medicine

## 2017-05-01 ENCOUNTER — Telehealth: Payer: Self-pay | Admitting: Internal Medicine

## 2017-05-01 MED ORDER — VENLAFAXINE HCL ER 37.5 MG PO CP24: 37.5000 mg | ORAL_CAPSULE | Freq: Every day | ORAL | 1 refills | Status: DC

## 2017-05-01 MED ORDER — OMEPRAZOLE 20 MG PO CPDR: 20.0000 mg | DELAYED_RELEASE_CAPSULE | Freq: Every day | ORAL | 1 refills | Status: DC

## 2017-05-01 NOTE — Telephone Encounter (Signed)
Medication has been refilled.

## 2017-05-01 NOTE — Telephone Encounter (Signed)
Pt is requesting refills on the following medications;venlafaxine XR (EFFEXOR-XR) 37.5 MG 24 hr capsule and omeprazole (PRILOSEC) 20 MG capsule. This needs to be refilled through mail order.

## 2017-05-27 DIAGNOSIS — Z961 Presence of intraocular lens: Secondary | ICD-10-CM | POA: Diagnosis not present

## 2017-06-01 DIAGNOSIS — D1800 Hemangioma unspecified site: Secondary | ICD-10-CM | POA: Diagnosis not present

## 2017-06-01 DIAGNOSIS — L821 Other seborrheic keratosis: Secondary | ICD-10-CM | POA: Diagnosis not present

## 2017-06-01 DIAGNOSIS — D229 Melanocytic nevi, unspecified: Secondary | ICD-10-CM | POA: Diagnosis not present

## 2017-06-01 DIAGNOSIS — I8393 Asymptomatic varicose veins of bilateral lower extremities: Secondary | ICD-10-CM | POA: Diagnosis not present

## 2017-06-01 DIAGNOSIS — L719 Rosacea, unspecified: Secondary | ICD-10-CM | POA: Diagnosis not present

## 2017-06-01 DIAGNOSIS — Z85828 Personal history of other malignant neoplasm of skin: Secondary | ICD-10-CM | POA: Diagnosis not present

## 2017-06-01 DIAGNOSIS — L812 Freckles: Secondary | ICD-10-CM | POA: Diagnosis not present

## 2017-06-01 DIAGNOSIS — I781 Nevus, non-neoplastic: Secondary | ICD-10-CM | POA: Diagnosis not present

## 2017-06-01 DIAGNOSIS — R202 Paresthesia of skin: Secondary | ICD-10-CM | POA: Diagnosis not present

## 2017-06-01 DIAGNOSIS — D485 Neoplasm of uncertain behavior of skin: Secondary | ICD-10-CM | POA: Diagnosis not present

## 2017-06-17 ENCOUNTER — Other Ambulatory Visit: Payer: Self-pay

## 2017-06-19 ENCOUNTER — Other Ambulatory Visit (INDEPENDENT_AMBULATORY_CARE_PROVIDER_SITE_OTHER): Payer: Medicare Other

## 2017-06-19 DIAGNOSIS — E7800 Pure hypercholesterolemia, unspecified: Secondary | ICD-10-CM

## 2017-06-19 DIAGNOSIS — G4730 Sleep apnea, unspecified: Secondary | ICD-10-CM

## 2017-06-19 LAB — CBC WITH DIFFERENTIAL/PLATELET
BASOS ABS: 0 10*3/uL (ref 0.0–0.1)
Basophils Relative: 0.6 % (ref 0.0–3.0)
Eosinophils Absolute: 0 10*3/uL (ref 0.0–0.7)
Eosinophils Relative: 0.9 % (ref 0.0–5.0)
HEMATOCRIT: 39.1 % (ref 36.0–46.0)
HEMOGLOBIN: 12.8 g/dL (ref 12.0–15.0)
LYMPHS PCT: 28.1 % (ref 12.0–46.0)
Lymphs Abs: 1.4 10*3/uL (ref 0.7–4.0)
MCHC: 32.8 g/dL (ref 30.0–36.0)
MCV: 93.1 fl (ref 78.0–100.0)
MONOS PCT: 7.7 % (ref 3.0–12.0)
Monocytes Absolute: 0.4 10*3/uL (ref 0.1–1.0)
NEUTROS PCT: 62.7 % (ref 43.0–77.0)
Neutro Abs: 3.1 10*3/uL (ref 1.4–7.7)
Platelets: 228 10*3/uL (ref 150.0–400.0)
RBC: 4.2 Mil/uL (ref 3.87–5.11)
RDW: 13.3 % (ref 11.5–15.5)
WBC: 5 10*3/uL (ref 4.0–10.5)

## 2017-06-19 LAB — LIPID PANEL
CHOL/HDL RATIO: 6
Cholesterol: 207 mg/dL — ABNORMAL HIGH (ref 0–200)
HDL: 36.9 mg/dL — AB (ref >39.00)
LDL CALC: 140 mg/dL — AB (ref 0–99)
NONHDL: 170.46
TRIGLYCERIDES: 152 mg/dL — AB (ref 0.0–149.0)
VLDL: 30.4 mg/dL (ref 0.0–40.0)

## 2017-06-19 LAB — COMPREHENSIVE METABOLIC PANEL
ALT: 21 U/L (ref 0–35)
AST: 19 U/L (ref 0–37)
Albumin: 4.4 g/dL (ref 3.5–5.2)
Alkaline Phosphatase: 63 U/L (ref 39–117)
BILIRUBIN TOTAL: 0.6 mg/dL (ref 0.2–1.2)
BUN: 12 mg/dL (ref 6–23)
CALCIUM: 10.7 mg/dL — AB (ref 8.4–10.5)
CO2: 30 meq/L (ref 19–32)
Chloride: 106 mEq/L (ref 96–112)
Creatinine, Ser: 0.8 mg/dL (ref 0.40–1.20)
GFR: 75.71 mL/min (ref >60.00)
GLUCOSE: 94 mg/dL (ref 70–99)
POTASSIUM: 4.5 meq/L (ref 3.5–5.1)
Sodium: 138 mEq/L (ref 135–145)
Total Protein: 7.5 g/dL (ref 6.0–8.3)

## 2017-06-19 LAB — TSH: TSH: 2.72 u[IU]/mL (ref 0.35–4.50)

## 2017-06-21 ENCOUNTER — Encounter: Payer: Self-pay | Admitting: Internal Medicine

## 2017-06-23 ENCOUNTER — Encounter: Payer: Self-pay | Admitting: Internal Medicine

## 2017-06-23 ENCOUNTER — Ambulatory Visit (INDEPENDENT_AMBULATORY_CARE_PROVIDER_SITE_OTHER): Payer: Medicare Other | Admitting: Internal Medicine

## 2017-06-23 ENCOUNTER — Other Ambulatory Visit (HOSPITAL_COMMUNITY)
Admission: RE | Admit: 2017-06-23 | Discharge: 2017-06-23 | Disposition: A | Discharge status: Home / Self Care | Payer: Medicare Other | Source: Ambulatory Visit | Attending: Internal Medicine | Admitting: Internal Medicine

## 2017-06-23 DIAGNOSIS — Z0000 Encounter for general adult medical examination without abnormal findings: Secondary | ICD-10-CM | POA: Diagnosis not present

## 2017-06-23 DIAGNOSIS — G4730 Sleep apnea, unspecified: Secondary | ICD-10-CM | POA: Diagnosis not present

## 2017-06-23 DIAGNOSIS — F32A Depression, unspecified: Secondary | ICD-10-CM

## 2017-06-23 DIAGNOSIS — Z124 Encounter for screening for malignant neoplasm of cervix: Secondary | ICD-10-CM | POA: Insufficient documentation

## 2017-06-23 DIAGNOSIS — F329 Major depressive disorder, single episode, unspecified: Secondary | ICD-10-CM | POA: Diagnosis not present

## 2017-06-23 DIAGNOSIS — E7800 Pure hypercholesterolemia, unspecified: Secondary | ICD-10-CM

## 2017-06-23 NOTE — Progress Notes (Signed)
[name], female   DOB: October 02, 1951, 66 y.o.   MRN: [id number]   Subjective:    [name], female    DOB: 01/15/1951, 66 y.o.   MRN: [id number]  HPI  Patient with past history of GERD and previous depression.  She comes in today to follow up on these issues as well as for a complete physical exam.  She has been under increased stress recently.  Discussed with her today.  She does feel things are improved now.  She feels she is handling things relatively well.  She does report some fatigue.  She is sleeping ok.  Using cpap nightly.  We discussed the possibility of assessing settings.  Recent CPAP review ok.  No chest pain.  No sob.  No acid reflux.  No abdominal pain.  Bowels moving.  Seeing opthalmology.  Has early macular degeneration.  Discussed lab results.  Discussed diet and exercise.     Past Medical History:  Diagnosis Date  . ADD (attention deficit disorder)   . Allergy   . Depression   . GERD (gastroesophageal reflux disease)   . Lyme disease    history   Past Surgical History:  Procedure Laterality Date  . BREAST BIOPSY Left 20 yrs ago  . TUBAL LIGATION  1978   bilateral  . WISDOM TOOTH EXTRACTION  1980   Family History  Problem Relation Age of Onset  . Uterine cancer Mother   . Heart disease Maternal Grandmother   . Alcohol abuse Maternal Grandfather   . Parkinson's disease Paternal Grandmother   . Depression Paternal Grandfather   . Breast cancer Neg Hx    Social History   Social History  . Marital status: Widowed    Spouse name: N/A  . Number of children: 2  . Years of education: N/A   Social History Main Topics  . Smoking status: Never Smoker  . Smokeless tobacco: Never Used  . Alcohol use 0.0 oz/week     Comment: occasional  . Drug use: No  . Sexual activity: Yes   Other Topics Concern  . None   Social History Narrative  . None    Outpatient Encounter Prescriptions as of 06/23/2017  Medication Sig  .  omeprazole (PRILOSEC) 20 MG capsule Take 1 capsule (20 mg total) by mouth daily.  Marland Kitchen venlafaxine XR (EFFEXOR-XR) 37.5 MG 24 hr capsule Take 1 capsule (37.5 mg total) by mouth daily.   No facility-administered encounter medications on file as of 06/23/2017.     Review of Systems  Constitutional: Negative for appetite change and unexpected weight change.  HENT: Negative for congestion and sinus pressure.   Eyes: Negative for pain and visual disturbance.  Respiratory: Negative for cough, chest tightness and shortness of breath.   Cardiovascular: Negative for chest pain, palpitations and leg swelling.  Gastrointestinal: Negative for abdominal pain, diarrhea, nausea and vomiting.  Genitourinary: Negative for difficulty urinating and dysuria.  Musculoskeletal: Negative for back pain and joint swelling.  Skin: Negative for color change and rash.  Neurological: Negative for dizziness, light-headedness and headaches.  Hematological: Negative for adenopathy. Does not bruise/bleed easily.  Psychiatric/Behavioral: Negative for agitation and dysphoric mood.       Objective:    Physical Exam  Constitutional: She is oriented to person, place, and time. She appears well-developed and well-nourished. No distress.  HENT:  Nose: Nose normal.  Mouth/Throat: Oropharynx is clear and moist.  Eyes: Right eye exhibits no discharge. Left eye exhibits  no discharge. No scleral icterus.  Neck: Neck supple. No thyromegaly present.  Cardiovascular: Normal rate and regular rhythm.   Pulmonary/Chest: Breath sounds normal. No accessory muscle usage. No tachypnea. No respiratory distress. She has no decreased breath sounds. She has no wheezes. She has no rhonchi. Right breast exhibits no inverted nipple, no mass, no nipple discharge and no tenderness (no axillary adenopathy). Left breast exhibits no inverted nipple, no mass, no nipple discharge and no tenderness (no axilarry adenopathy).  Abdominal: Soft. Bowel sounds  are normal. There is no tenderness.  Genitourinary:  Genitourinary Comments: Normal external genitalia.  Vaginal vault without lesions.  Cervix identified.  Pap smear performed.  Could not appreciate any adnexal masses or tenderness.    Musculoskeletal: She exhibits no edema or tenderness.  Lymphadenopathy:    She has no cervical adenopathy.  Neurological: She is alert and oriented to person, place, and time.  Skin: Skin is warm. No rash noted. No erythema.  Psychiatric: She has a normal mood and affect. Her behavior is normal.    BP 116/68 (BP Location: Left Arm, Patient Position: Sitting, Cuff Size: Normal)   Pulse 95   Temp 98.7 F (37.1 C) (Oral)   Resp 12   Ht 5' 7.32" (1.71 m)   Wt 175 lb 3.2 oz (79.5 kg)   LMP 07/16/1995   SpO2 95%   BMI 27.18 kg/m  Wt Readings from Last 3 Encounters:  06/23/17 175 lb 3.2 oz (79.5 kg)  01/14/17 166 lb 9.6 oz (75.6 kg)  10/28/16 169 lb 9.6 oz (76.9 kg)     Lab Results  Component Value Date   WBC 5.0 06/19/2017   HGB 12.8 06/19/2017   HCT 39.1 06/19/2017   PLT 228.0 06/19/2017   GLUCOSE 94 06/19/2017   CHOL 207 (H) 06/19/2017   TRIG 152.0 (H) 06/19/2017   HDL 36.90 (L) 06/19/2017   LDLDIRECT 131.4 08/17/2014   LDLCALC 140 (H) 06/19/2017   ALT 21 06/19/2017   AST 19 06/19/2017   NA 138 06/19/2017   K 4.5 06/19/2017   CL 106 06/19/2017   CREATININE 0.80 06/19/2017   BUN 12 06/19/2017   CO2 30 06/19/2017   TSH 2.72 06/19/2017    Dg Esophagus  Result Date: 01/30/2017 CLINICAL DATA:  Choking on solids and liquids for many years. EXAM: ESOPHOGRAM / BARIUM SWALLOW / BARIUM TABLET STUDY TECHNIQUE: Combined double contrast and single contrast examination performed using effervescent crystals, thick barium liquid, and thin barium liquid. The patient was observed with fluoroscopy swallowing a 13 mm barium sulphate tablet. FLUOROSCOPY TIME:  Fluoroscopy Time:  48 secs Radiation Exposure Index (if provided by the fluoroscopic device):  3.8 mGy Number of Acquired Spot Images: 0 COMPARISON:  None. FINDINGS: There was normal pharyngeal anatomy and motility. Contrast flowed freely through the esophagus without evidence of stricture or mass. There was normal esophageal mucosa without evidence of irregularity or ulceration. Esophageal motility was normal. Mild gastroesophageal reflux. No definite hiatal hernia was demonstrated. At the end of the examination a 13 mm barium tablet was administered which transited through the esophagus and esophagogastric junction without delay. IMPRESSION: 1. Mild gastroesophageal reflux. 2. Otherwise normal swallow. Electronically Signed   By: Kathreen Devoid   On: 01/30/2017 09:03       Assessment & Plan:   Problem List Items Addressed This Visit    Depression    Increased stress recently.  On effexor.  Doing better.  Stress better.  Does not feel she needs anything  more at this time.  Follow.       Health care maintenance    Physical today 06/23/17.  Colonoscopy 01/15/15 - tortuous colon, diverticulosis in the sigmoid colon and in the descending colon.  Recommended f/u colonoscopy in 10 years.  Mammogram 12/30/16 - Birads I.  PAP 06/23/17.       Hypercholesterolemia    Discussed recent cholesterol results.  Discussed calculated risk.  She wants to get back in her routine of diet and exercise and follow cholesterol.  Hold on medication.  Follow.       Sleep apnea    Using cpap regularly.  Discussed her fatigue.  Will hold on auto titration.  Stress is better.  She would like to monitor for now.  Will let me know if persistent problems.         Other Visit Diagnoses    Hypercalcemia    -  Primary   Relevant Orders   Calcium   Screening for cervical cancer       Relevant Orders   Cytology - PAP       [name], MD

## 2017-06-23 NOTE — Assessment & Plan Note (Addendum)
Physical today 06/23/17.  Colonoscopy 01/15/15 - tortuous colon, diverticulosis in the sigmoid colon and in the descending colon.  Recommended f/u colonoscopy in 10 years.  Mammogram 12/30/16 - Birads I.  PAP 06/23/17.

## 2017-06-23 NOTE — Telephone Encounter (Signed)
Hard copy mailed  

## 2017-06-23 NOTE — Progress Notes (Signed)
Pre-visit discussion using our clinic review tool. No additional management support is needed unless otherwise documented below in the visit note.  

## 2017-06-24 ENCOUNTER — Encounter: Payer: Self-pay | Admitting: Internal Medicine

## 2017-06-24 NOTE — Assessment & Plan Note (Signed)
Increased stress recently.  On effexor.  Doing better.  Stress better.  Does not feel she needs anything more at this time.  Follow.

## 2017-06-24 NOTE — Assessment & Plan Note (Signed)
Using cpap regularly.  Discussed her fatigue.  Will hold on auto titration.  Stress is better.  She would like to monitor for now.  Will let me know if persistent problems.

## 2017-06-24 NOTE — Assessment & Plan Note (Signed)
Discussed recent cholesterol results.  Discussed calculated risk.  She wants to get back in her routine of diet and exercise and follow cholesterol.  Hold on medication.  Follow.

## 2017-06-25 LAB — CYTOLOGY - PAP
Diagnosis: NEGATIVE
HPV (WINDOPATH): DETECTED — AB

## 2017-07-16 ENCOUNTER — Other Ambulatory Visit: Payer: Self-pay

## 2017-07-17 ENCOUNTER — Other Ambulatory Visit (INDEPENDENT_AMBULATORY_CARE_PROVIDER_SITE_OTHER): Payer: Medicare Other

## 2017-07-17 LAB — CALCIUM: Calcium: 10.6 mg/dL — ABNORMAL HIGH (ref 8.4–10.5)

## 2017-07-20 ENCOUNTER — Other Ambulatory Visit: Payer: Self-pay | Admitting: Internal Medicine

## 2017-07-20 NOTE — Progress Notes (Signed)
Order placed for labs.

## 2017-07-21 ENCOUNTER — Telehealth: Payer: Self-pay | Admitting: Internal Medicine

## 2017-07-21 NOTE — Telephone Encounter (Signed)
Sent pt my chart message regarding her question about calcium.  See result note.

## 2017-07-22 NOTE — Telephone Encounter (Signed)
Hard copy mailed  

## 2017-08-11 ENCOUNTER — Other Ambulatory Visit (INDEPENDENT_AMBULATORY_CARE_PROVIDER_SITE_OTHER): Payer: Medicare Other

## 2017-08-11 LAB — VITAMIN D 25 HYDROXY (VIT D DEFICIENCY, FRACTURES): VITD: 28.04 ng/mL — ABNORMAL LOW (ref 30.00–100.00)

## 2017-08-11 LAB — CALCIUM: CALCIUM: 10.7 mg/dL — AB (ref 8.4–10.5)

## 2017-08-11 NOTE — Addendum Note (Signed)
Addended by: Arby Barrette on: 08/11/2017 10:27 AM   Modules accepted: Orders

## 2017-08-12 ENCOUNTER — Other Ambulatory Visit: Payer: Self-pay | Admitting: Internal Medicine

## 2017-08-12 LAB — CALCIUM, IONIZED: CALCIUM ION: 5.9 mg/dL — AB (ref 4.8–5.6)

## 2017-08-12 LAB — PARATHYROID HORMONE, INTACT (NO CA): PTH: 67 pg/mL — AB (ref 14–64)

## 2017-08-12 NOTE — Progress Notes (Signed)
Order placed for f/u labs.  

## 2017-08-30 ENCOUNTER — Other Ambulatory Visit: Payer: Self-pay | Admitting: Internal Medicine

## 2017-08-30 DIAGNOSIS — E349 Endocrine disorder, unspecified: Secondary | ICD-10-CM

## 2017-08-30 DIAGNOSIS — R7989 Other specified abnormal findings of blood chemistry: Secondary | ICD-10-CM

## 2017-08-30 NOTE — Progress Notes (Signed)
Order placed for parathyroid scan.

## 2017-09-04 ENCOUNTER — Ambulatory Visit: Payer: Medicare Other

## 2017-09-04 ENCOUNTER — Other Ambulatory Visit: Payer: Self-pay

## 2017-09-08 ENCOUNTER — Encounter
Admission: RE | Admit: 2017-09-08 | Discharge: 2017-09-08 | Disposition: A | Discharge status: Home / Self Care | Payer: Medicare Other | Source: Ambulatory Visit | Attending: Internal Medicine | Admitting: Internal Medicine

## 2017-09-08 DIAGNOSIS — E349 Endocrine disorder, unspecified: Secondary | ICD-10-CM | POA: Insufficient documentation

## 2017-09-08 DIAGNOSIS — D351 Benign neoplasm of parathyroid gland: Secondary | ICD-10-CM | POA: Diagnosis not present

## 2017-09-08 MED ORDER — TECHNETIUM TC 99M SESTAMIBI GENERIC - CARDIOLITE
23.1070 | Freq: Once | INTRAVENOUS | Status: AC | PRN
  Administered 2017-09-08: 23.107 via INTRAVENOUS

## 2017-09-09 ENCOUNTER — Other Ambulatory Visit: Payer: Self-pay | Admitting: Internal Medicine

## 2017-09-09 DIAGNOSIS — Z23 Encounter for immunization: Secondary | ICD-10-CM | POA: Diagnosis not present

## 2017-09-10 ENCOUNTER — Other Ambulatory Visit: Payer: Self-pay

## 2017-09-14 ENCOUNTER — Encounter: Payer: Self-pay | Admitting: Internal Medicine

## 2017-09-14 NOTE — Telephone Encounter (Signed)
I sent this message to you in a result note.  Thanks

## 2017-09-14 NOTE — Progress Notes (Signed)
Pt  Called  Back    For  The  Results  Of  Her    Scan. She  Was  Given the results  Of the scan  And  The recommendations . She  Is  In agreement  With  The  Surgical  referrall  .      Thanks

## 2017-09-15 ENCOUNTER — Other Ambulatory Visit: Payer: Self-pay | Admitting: Internal Medicine

## 2017-09-15 DIAGNOSIS — D351 Benign neoplasm of parathyroid gland: Secondary | ICD-10-CM

## 2017-09-15 NOTE — Progress Notes (Signed)
Order placed for referral to surgery (Dr Harlow Asa).

## 2017-09-15 NOTE — Telephone Encounter (Signed)
Patient has results. See result note.

## 2017-10-14 ENCOUNTER — Ambulatory Visit: Payer: Self-pay | Admitting: Surgery

## 2017-10-14 DIAGNOSIS — E210 Primary hyperparathyroidism: Secondary | ICD-10-CM | POA: Diagnosis not present

## 2017-10-20 DIAGNOSIS — E210 Primary hyperparathyroidism: Secondary | ICD-10-CM | POA: Diagnosis not present

## 2017-10-27 ENCOUNTER — Telehealth: Payer: Self-pay

## 2017-10-27 NOTE — Telephone Encounter (Signed)
Copied from [place] [phone]. Topic: Quick Communication - Office Called Patient >> Oct 27, 2017  3:43 PM Robina Ade, Helene Kelp D wrote: Reason for CRM: Patient is returning Juliann Pulse missed called. Please call patient back, thanks.

## 2017-10-29 ENCOUNTER — Encounter: Payer: Self-pay | Admitting: Internal Medicine

## 2017-10-29 ENCOUNTER — Ambulatory Visit (INDEPENDENT_AMBULATORY_CARE_PROVIDER_SITE_OTHER): Payer: Medicare Other | Admitting: Internal Medicine

## 2017-10-29 ENCOUNTER — Ambulatory Visit (INDEPENDENT_AMBULATORY_CARE_PROVIDER_SITE_OTHER): Payer: Medicare Other

## 2017-10-29 VITALS — BP 110/60 | HR 72 | Temp 97.8°F | Resp 15 | Ht 67.0 in | Wt 172.0 lb

## 2017-10-29 VITALS — BP 110/60 | HR 72 | Temp 97.8°F | Ht 67.0 in | Wt 172.6 lb

## 2017-10-29 DIAGNOSIS — Z23 Encounter for immunization: Secondary | ICD-10-CM

## 2017-10-29 DIAGNOSIS — G4730 Sleep apnea, unspecified: Secondary | ICD-10-CM

## 2017-10-29 DIAGNOSIS — Z0000 Encounter for general adult medical examination without abnormal findings: Secondary | ICD-10-CM | POA: Diagnosis not present

## 2017-10-29 DIAGNOSIS — R413 Other amnesia: Secondary | ICD-10-CM | POA: Diagnosis not present

## 2017-10-29 DIAGNOSIS — F32A Depression, unspecified: Secondary | ICD-10-CM

## 2017-10-29 DIAGNOSIS — F329 Major depressive disorder, single episode, unspecified: Secondary | ICD-10-CM

## 2017-10-29 DIAGNOSIS — E7800 Pure hypercholesterolemia, unspecified: Secondary | ICD-10-CM

## 2017-10-29 DIAGNOSIS — G939 Disorder of brain, unspecified: Secondary | ICD-10-CM

## 2017-10-29 LAB — BASIC METABOLIC PANEL
BUN: 15 mg/dL (ref 6–23)
CALCIUM: 10.9 mg/dL — AB (ref 8.4–10.5)
CHLORIDE: 104 meq/L (ref 96–112)
CO2: 29 meq/L (ref 19–32)
CREATININE: 0.84 mg/dL (ref 0.40–1.20)
GFR: 71.49 mL/min (ref >60.00)
Glucose, Bld: 101 mg/dL — ABNORMAL HIGH (ref 70–99)
Potassium: 4.5 mEq/L (ref 3.5–5.1)
SODIUM: 139 meq/L (ref 135–145)

## 2017-10-29 LAB — VITAMIN B12: Vitamin B-12: 397 pg/mL (ref 211–911)

## 2017-10-29 LAB — HEPATIC FUNCTION PANEL
ALT: 14 U/L (ref 0–35)
AST: 18 U/L (ref 0–37)
Albumin: 4.3 g/dL (ref 3.5–5.2)
Alkaline Phosphatase: 66 U/L (ref 39–117)
BILIRUBIN DIRECT: 0.1 mg/dL (ref 0.0–0.3)
BILIRUBIN TOTAL: 0.6 mg/dL (ref 0.2–1.2)
TOTAL PROTEIN: 7.6 g/dL (ref 6.0–8.3)

## 2017-10-29 LAB — TSH: TSH: 1.96 u[IU]/mL (ref 0.35–4.50)

## 2017-10-29 MED ORDER — OMEPRAZOLE 20 MG PO CPDR: 20.0000 mg | DELAYED_RELEASE_CAPSULE | Freq: Every day | ORAL | 1 refills | Status: DC

## 2017-10-29 MED ORDER — VENLAFAXINE HCL ER 37.5 MG PO CP24: 37.5000 mg | ORAL_CAPSULE | Freq: Every day | ORAL | 1 refills | Status: DC

## 2017-10-29 NOTE — Patient Instructions (Addendum)
  Ann Osborne , Thank you for taking time to come for your Medicare Wellness Visit. I appreciate your ongoing commitment to your health goals. Please review the following plan we discussed and let me know if I can assist you in the future.   Merry Christmas!  These are the goals we discussed: Goals    . Increase physical activity     Walk for exercise 3 days weekly, 30-45 min,  moderate pace       This is a list of the screening recommended for you and due dates:  Health Maintenance  Topic Date Due  . Tetanus Vaccine  01/01/1968  . Mammogram  12/30/2017  . Colon Cancer Screening  01/14/2025  . Flu Shot  Completed  . DEXA scan (bone density measurement)  Completed  .  Hepatitis C: One time screening is recommended by Center for Disease Control  (CDC) for  adults born from [birth year] through 1967.   Completed  . Pneumonia vaccines  Completed

## 2017-10-29 NOTE — Progress Notes (Signed)
[name], female   DOB: 04-17-1951, 66 y.o.   MRN: [id number]   Subjective:    [name], female    DOB: 01/20/1951, 66 y.o.   MRN: [id number]  HPI  Patient here for a scheduled follow up.  She reports she is doing relatively well.  Increased stress with her daughters personal issues.  Discussed with her today.  Overall she feels she is handling things relatively well.  Stays active.  No chest pain.  No sob.  No acid reflux.  No abdominal pain.  Bowels moving.  No urine change.    Past Medical History:  Diagnosis Date  . ADD (attention deficit disorder)   . Allergy   . Depression   . GERD (gastroesophageal reflux disease)   . Lyme disease    history   Past Surgical History:  Procedure Laterality Date  . BREAST BIOPSY Left 20 yrs ago  . TUBAL LIGATION  1978   bilateral  . WISDOM TOOTH EXTRACTION  1980   Family History  Problem Relation Age of Onset  . Uterine cancer Mother   . Heart disease Maternal Grandmother   . Alcohol abuse Maternal Grandfather   . Parkinson's disease Paternal Grandmother   . Depression Paternal Grandfather   . Breast cancer Neg Hx    Social History   Socioeconomic History  . Marital status: Widowed    Spouse name: None  . Number of children: 2  . Years of education: None  . Highest education level: None  Social Needs  . Financial resource strain: None  . Food insecurity - worry: None  . Food insecurity - inability: None  . Transportation needs - medical: None  . Transportation needs - non-medical: None  Occupational History  . None  Tobacco Use  . Smoking status: Never Smoker  . Smokeless tobacco: Never Used  Substance and Sexual Activity  . Alcohol use: Yes    Alcohol/week: 0.0 oz    Comment: occasional  . Drug use: No  . Sexual activity: Yes  Other Topics Concern  . None  Social History Narrative  . None    Outpatient Encounter Medications as of 10/29/2017  Medication Sig  . omeprazole  (PRILOSEC) 20 MG capsule Take 1 capsule (20 mg total) by mouth daily.  Marland Kitchen venlafaxine XR (EFFEXOR-XR) 37.5 MG 24 hr capsule Take 1 capsule (37.5 mg total) by mouth daily.  . [DISCONTINUED] omeprazole (PRILOSEC) 20 MG capsule Take 1 capsule (20 mg total) by mouth daily.  . [DISCONTINUED] venlafaxine XR (EFFEXOR-XR) 37.5 MG 24 hr capsule Take 1 capsule (37.5 mg total) by mouth daily.   No facility-administered encounter medications on file as of 10/29/2017.     Review of Systems  Constitutional: Negative for appetite change and unexpected weight change.  HENT: Negative for congestion and sinus pressure.   Respiratory: Negative for cough, chest tightness and shortness of breath.   Cardiovascular: Negative for chest pain, palpitations and leg swelling.  Gastrointestinal: Negative for abdominal pain, diarrhea, nausea and vomiting.  Genitourinary: Negative for difficulty urinating and dysuria.  Musculoskeletal: Negative for joint swelling and myalgias.  Skin: Negative for color change and rash.  Neurological: Negative for dizziness, light-headedness and headaches.  Psychiatric/Behavioral: Negative for agitation and dysphoric mood.       Increased stress as outlined.         Objective:    Physical Exam  Constitutional: She appears well-developed and well-nourished. No distress.  HENT:  Nose: Nose normal.  Mouth/Throat: Oropharynx is clear and moist.  Neck: Neck supple. No thyromegaly present.  Cardiovascular: Normal rate and regular rhythm.  Pulmonary/Chest: Breath sounds normal. No respiratory distress. She has no wheezes.  Abdominal: Soft. Bowel sounds are normal. There is no tenderness.  Musculoskeletal: She exhibits no edema or tenderness.  Lymphadenopathy:    She has no cervical adenopathy.  Skin: No rash noted. No erythema.  Psychiatric: She has a normal mood and affect. Her behavior is normal.    BP 110/60 (BP Location: Right Arm, Patient Position: Sitting, Cuff Size: Normal)    Pulse 72   Temp 97.8 F (36.6 C) (Oral)   Ht 5\' 7"  (1.702 m)   Wt 172 lb 9.6 oz (78.3 kg)   LMP 07/16/1995   SpO2 98%   BMI 27.03 kg/m  Wt Readings from Last 3 Encounters:  10/29/17 172 lb (78 kg)  10/29/17 172 lb 9.6 oz (78.3 kg)  06/23/17 175 lb 3.2 oz (79.5 kg)     Lab Results  Component Value Date   WBC 5.0 06/19/2017   HGB 12.8 06/19/2017   HCT 39.1 06/19/2017   PLT 228.0 06/19/2017   GLUCOSE 101 (H) 10/29/2017   CHOL 207 (H) 06/19/2017   TRIG 152.0 (H) 06/19/2017   HDL 36.90 (L) 06/19/2017   LDLDIRECT 131.4 08/17/2014   LDLCALC 140 (H) 06/19/2017   ALT 14 10/29/2017   AST 18 10/29/2017   NA 139 10/29/2017   K 4.5 10/29/2017   CL 104 10/29/2017   CREATININE 0.84 10/29/2017   BUN 15 10/29/2017   CO2 29 10/29/2017   TSH 1.96 10/29/2017    Nm Parathyroid W/spect/ct  Result Date: 09/08/2017 CLINICAL DATA:  Clinical suspicion of multiple endocrine neoplasm 1. Evaluate for parathyroid adenoma EXAM: NM PARATHYROID SCINTIGRAPHY AND SPECT IMAGING TECHNIQUE: Following intravenous administration of radiopharmaceutical, early and 2-hour delayed planar images were obtained in the anterior projection. Delayed triplanar SPECT images were also obtained at 2 hours. RADIOPHARMACEUTICALS:  23.1 mCi Tc-8m Sestamibi IV COMPARISON:  None. FINDINGS: Delayed 2 hour imaging demonstrates focus of retained radiotracer inferior to the LEFT lobe of the thyroid gland. This activity is contiguous with the gland. SPECT imaging of the thyroid bed confirms activity in this location. On the CT portion exam there is mild nodular thickening in the most inferior pole of the LEFT lobe of the gland measuring 7 mm (image 261, series 3). 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 Electronically Signed   By: Suzy Bouchard M.D.   On: 09/08/2017 17:02       Assessment & Plan:   Problem List Items Addressed This  Visit    Cerebellar lesion    Saw Dr Jorene Minors.  Felt lesion stable.  (felt was - fat).        Depression    Increased stress as outlined.  Overall she feels she is handling things relatively well.  On effexor.  Follow.        Relevant Medications   venlafaxine XR (EFFEXOR-XR) 37.5 MG 24 hr capsule   Hypercholesterolemia - Primary    Low cholesterol diet and exercise.  Follow lipid panel.        Relevant Orders   Hepatic function panel (Completed)   Basic metabolic panel (Completed)   Memory change   Relevant Orders   TSH (Completed)   Vitamin B12 (Completed)   Sleep apnea    Using cpap regularly.  Doing well.         Other Visit Diagnoses    Need for pneumococcal vaccination       Relevant Orders   Pneumococcal polysaccharide vaccine 23-valent greater than or equal to 2yo subcutaneous/IM (Completed)       [name], MD

## 2017-10-29 NOTE — Progress Notes (Signed)
Subjective:   Ann Osborne is a 66 y.o. female who presents for Medicare Annual (Subsequent) preventive examination.  Review of Systems:  No ROS.  Medicare Wellness Visit. Additional risk factors are reflected in the social history.  Cardiac Risk Factors include: advanced age (>3men, >42 women)     Objective:     Vitals: BP 110/60 (BP Location: Left Arm, Patient Position: Sitting, Cuff Size: Normal)   Pulse 72   Temp 97.8 F (36.6 C) (Oral)   Resp 15   Ht 5\' 7"  (1.702 m)   Wt 172 lb (78 kg)   LMP 07/16/1995   SpO2 98%   BMI 26.94 kg/m   Body mass index is 26.94 kg/m.  Advanced Directives 10/29/2017 10/28/2016 01/26/2016  Does Patient Have a Medical Advance Directive? Yes Yes Yes  Type of Advance Directive Living will;Healthcare Power of [place]  Does patient want to make changes to medical advance directive? No - Patient declined No - Patient declined -  Copy of [place] in Chart? No - copy requested No - copy requested -    Tobacco Social History   Tobacco Use  Smoking Status Never Smoker  Smokeless Tobacco Never Used     Counseling given: Not Answered   Clinical Intake:  Pre-visit preparation completed: Yes  Pain : No/denies pain     Nutritional Status: BMI 25 -29 Overweight Diabetes: No  How often do you need to have someone help you when you read instructions, pamphlets, or other written materials from your doctor or pharmacy?: 1 - Never  Interpreter Needed?: No     Past Medical History:  Diagnosis Date  . ADD (attention deficit disorder)   . Allergy   . Depression   . GERD (gastroesophageal reflux disease)   . Lyme disease    history   Past Surgical History:  Procedure Laterality Date  . BREAST BIOPSY Left 20 yrs ago  . TUBAL LIGATION  1978   bilateral  . WISDOM TOOTH EXTRACTION  1980   Family History  Problem Relation Age of Onset  . Uterine cancer  Mother   . Heart disease Maternal Grandmother   . Alcohol abuse Maternal Grandfather   . Parkinson's disease Paternal Grandmother   . Depression Paternal Grandfather   . Breast cancer Neg Hx    Social History   Socioeconomic History  . Marital status: Widowed    Spouse name: None  . Number of children: 2  . Years of education: None  . Highest education level: None  Social Needs  . Financial resource strain: None  . Food insecurity - worry: None  . Food insecurity - inability: None  . Transportation needs - medical: None  . Transportation needs - non-medical: None  Occupational History  . None  Tobacco Use  . Smoking status: Never Smoker  . Smokeless tobacco: Never Used  Substance and Sexual Activity  . Alcohol use: Yes    Alcohol/week: 0.0 oz    Comment: occasional  . Drug use: No  . Sexual activity: Yes  Other Topics Concern  . None  Social History Narrative  . None    Outpatient Encounter Medications as of 10/29/2017  Medication Sig  . omeprazole (PRILOSEC) 20 MG capsule Take 1 capsule (20 mg total) by mouth daily.  Marland Kitchen venlafaxine XR (EFFEXOR-XR) 37.5 MG 24 hr capsule Take 1 capsule (37.5 mg total) by mouth daily.   No facility-administered encounter medications on  file as of 10/29/2017.     Activities of Daily Living In your present state of health, do you have any difficulty performing the following activities: 10/29/2017  Hearing? N  Vision? N  Difficulty concentrating or making decisions? N  Walking or climbing stairs? N  Dressing or bathing? N  Doing errands, shopping? N  Preparing Food and eating ? N  Using the Toilet? N  In the past six months, have you accidently leaked urine? N  Do you have problems with loss of bowel control? N  Managing your Medications? N  Managing your Finances? N  Housekeeping or managing your Housekeeping? N  Some recent data might be hidden    Patient Care Team: [name], MD as PCP - General (Internal  Medicine)    Assessment:   This is a routine wellness examination for Ann Osborne. The goal of the wellness visit is to assist the patient how to close the gaps in care and create a preventative care plan for the patient.   The roster of all physicians providing medical care to patient is listed in the Snapshot section of the chart.  Osteoporosis risk reviewed.    Safety issues reviewed; Smoke and carbon monoxide detectors in the home. No firearms in the home. Wears seatbelts when driving or riding with others. Patient does wear sunscreen or protective clothing when in direct sunlight. No violence in the home.  Patient is alert, normal appearance, oriented to person/place/and time. Correctly identified the president of the [place], recall of 3/3 words, and performing simple calculations. Displays appropriate judgement and can read correct time from watch face.   No new identified risk were noted.  No failures at ADL's or IADL's.    BMI- discussed the importance of a healthy diet, water intake and the benefits of aerobic exercise. Educational material provided. She plans to increase her water intake and physical activity by walking more often at a moderate pace.   24 hour diet recall: Low car foods  Dental- every 6 months.  [facility].  Eye- Visual acuity not assessed per patient preference since they have regular follow up with the ophthalmologist.  Wears corrective lenses.  TDAP vaccine deferred per patient preference.  Follow up with insurance.  Educational material provided.  Patient Concerns: None at this time. Follow up with PCP as needed.  Exercise Activities and Dietary recommendations Current Exercise Habits: Home exercise routine, Type of exercise: walking, Time (Minutes): 20, Frequency (Times/Week): 4, Weekly Exercise (Minutes/Week): 80  Goals    . Increase physical activity     Walk for exercise 3 days weekly, 30-45 min,  moderate pace       Fall Risk Fall Risk  10/29/2017  06/23/2017 10/28/2016 10/17/2016 09/17/2015  Falls in the past year? No No No No No  Comment - - - Emmi Telephone Survey: data to providers prior to load -   Depression Screen PHQ 2/9 Scores 10/29/2017 06/23/2017 10/28/2016 09/17/2015  PHQ - 2 Score 0 0 0 0  PHQ- 9 Score 0 3 - -     Cognitive Function MMSE - Mini Mental State Exam 10/29/2017  Orientation to time 5  Orientation to Place 5  Registration 3  Attention/ Calculation 5  Recall 3  Language- name 2 objects 2  Language- repeat 1  Language- follow 3 step command 3  Language- read & follow direction 1  Write a sentence 1  Copy design 1  Total score 30     6CIT Screen 10/28/2016  What Year?  0 points  What month? 0 points  What time? 0 points  Count back from 20 0 points  Months in reverse 0 points  Repeat phrase 0 points  Total Score 0    Immunization History  Administered Date(s) Administered  . Influenza, Quadrivalent, Recombinant, Inj, Pf 08/19/2017  . Influenza,inj,Quad PF,6+ Mos 08/25/2013, 08/02/2014  . Influenza-Unspecified 08/20/2015, 08/19/2016  . Pneumococcal Conjugate-13 08/19/2013  . Pneumococcal Polysaccharide-23 08/19/2012, 10/29/2017    Screening Tests Health Maintenance  Topic Date Due  . TETANUS/TDAP  01/01/1968  . MAMMOGRAM  12/30/2017  . COLONOSCOPY  01/14/2025  . INFLUENZA VACCINE  Completed  . DEXA SCAN  Completed  . Hepatitis C Screening  Completed  . PNA vac Low Risk Adult  Completed      Plan:    End of life planning; Advance aging; Advanced directives discussed. Copy of current HCPOA/Living Will requested.    I have personally reviewed and noted the following in the patient's chart:   . Medical and social history . Use of alcohol, tobacco or illicit drugs  . Current medications and supplements . Functional ability and status . Nutritional status . Physical activity . Advanced directives . List of other physicians . Hospitalizations, surgeries, and ER visits in previous 12  months . Vitals . Screenings to include cognitive, depression, and falls . Referrals and appointments  In addition, I have reviewed and discussed with patient certain preventive protocols, quality metrics, and best practice recommendations. A written personalized care plan for preventive services as well as general preventive health recommendations were provided to patient.     Varney Biles, LPN  28/41/3244   Reviewed above information.  Agree with assessment and plan.   Dr Nicki Reaper

## 2017-10-31 ENCOUNTER — Encounter: Payer: Self-pay | Admitting: Internal Medicine

## 2017-10-31 NOTE — Assessment & Plan Note (Signed)
Using cpap regularly.  Doing well.  

## 2017-11-01 ENCOUNTER — Encounter: Payer: Self-pay | Admitting: Internal Medicine

## 2017-11-01 NOTE — Assessment & Plan Note (Signed)
Saw Dr Jorene Minors.  Felt lesion stable.  (felt was - fat).

## 2017-11-01 NOTE — Assessment & Plan Note (Signed)
Low cholesterol diet and exercise.  Follow lipid panel.   

## 2017-11-01 NOTE — Assessment & Plan Note (Signed)
Increased stress as outlined.  Overall she feels she is handling things relatively well.  On effexor.  Follow.

## 2017-11-06 NOTE — Progress Notes (Signed)
LOV D CHARLENE SCOTT 10-29-17 Epic  BMP, HEPATIC PANEL, TSH 10-29-17 EPIC

## 2017-11-06 NOTE — Patient Instructions (Addendum)
Ann Osborne  11/06/2017   Your procedure is scheduled on: 11-19-17  Report to [facility] Main  Entrance    Report to admitting at [place]   Call this number if you have problems the morning of surgery [phone]    Remember: ONLY 1 PERSON MAY GO WITH YOU TO SHORT STAY TO GET  READY MORNING OF YOUR SURGERY.    Do not eat food or drink liquids :After Midnight.    PLEASE BRING CPAP MASK AND TUBING ONLY. DEVICE WILL BE PROVIDED!   Take these medicines the morning of surgery with A SIP OF WATER: venlafaxine, omeprazole                                 You may not have any metal on your body including hair pins and              piercings  Do not wear jewelry, make-up, lotions, powders or perfumes, deodorant             Do not wear nail polish.  Do not shave  48 hours prior to surgery.               Do not bring valuables to the hospital. [place].  Contacts, dentures or bridgework may not be worn into surgery.      Patients discharged the day of surgery will not be allowed to drive home.  Name and phone number of your driver:  Special Instructions: N/A              Please read over the following fact sheets you were given: _____________________________________________________________________            [facility] - Preparing for Surgery Before surgery, you can play an important role.  Because skin is not sterile, your skin needs to be as free of germs as possible.  You can reduce the number of germs on your skin by washing with CHG (chlorahexidine gluconate) soap before surgery.  CHG is an antiseptic cleaner which kills germs and bonds with the skin to continue killing germs even after washing. Please DO NOT use if you have an allergy to CHG or antibacterial soaps.  If your skin becomes reddened/irritated stop using the CHG and inform your nurse when you arrive at Short Stay. Do not shave (including legs  and underarms) for at least 48 hours prior to the first CHG shower.  You may shave your face/neck. Please follow these instructions carefully:  1.  Shower with CHG Soap the night before surgery and the  morning of Surgery.  2.  If you choose to wash your hair, wash your hair first as usual with your  normal  shampoo.  3.  After you shampoo, rinse your hair and body thoroughly to remove the  shampoo.                           4.  Use CHG as you would any other liquid soap.  You can apply chg directly  to the skin and wash  Gently with a scrungie or clean washcloth.  5.  Apply the CHG Soap to your body ONLY FROM THE NECK DOWN.   Do not use on face/ open                           Wound or open sores. Avoid contact with eyes, ears mouth and genitals (private parts).                       Wash face,  Genitals (private parts) with your normal soap.             6.  Wash thoroughly, paying special attention to the area where your surgery  will be performed.  7.  Thoroughly rinse your body with warm water from the neck down.  8.  DO NOT shower/wash with your normal soap after using and rinsing off  the CHG Soap.                9.  Pat yourself dry with a clean towel.            10.  Wear clean pajamas.            11.  Place clean sheets on your bed the night of your first shower and do not  sleep with pets. Day of Surgery : Do not apply any lotions/deodorants the morning of surgery.  Please wear clean clothes to the hospital/surgery center.  FAILURE TO FOLLOW THESE INSTRUCTIONS MAY RESULT IN THE CANCELLATION OF YOUR SURGERY PATIENT SIGNATURE_________________________________  NURSE SIGNATURE__________________________________  ________________________________________________________________________

## 2017-11-09 ENCOUNTER — Encounter (HOSPITAL_COMMUNITY)
Admission: RE | Admit: 2017-11-09 | Discharge: 2017-11-09 | Disposition: A | Discharge status: Home / Self Care | Payer: Medicare Other | Source: Ambulatory Visit | Attending: Surgery | Admitting: Surgery

## 2017-11-09 ENCOUNTER — Encounter (HOSPITAL_COMMUNITY): Payer: Self-pay

## 2017-11-09 ENCOUNTER — Other Ambulatory Visit: Payer: Self-pay

## 2017-11-09 DIAGNOSIS — E210 Primary hyperparathyroidism: Secondary | ICD-10-CM | POA: Diagnosis not present

## 2017-11-09 DIAGNOSIS — R001 Bradycardia, unspecified: Secondary | ICD-10-CM | POA: Insufficient documentation

## 2017-11-09 DIAGNOSIS — Z01810 Encounter for preprocedural cardiovascular examination: Secondary | ICD-10-CM | POA: Insufficient documentation

## 2017-11-09 DIAGNOSIS — I4510 Unspecified right bundle-branch block: Secondary | ICD-10-CM | POA: Insufficient documentation

## 2017-11-09 DIAGNOSIS — Z01812 Encounter for preprocedural laboratory examination: Secondary | ICD-10-CM | POA: Diagnosis not present

## 2017-11-09 HISTORY — DX: Hyperparathyroidism, unspecified: E21.3

## 2017-11-09 LAB — CBC
HEMATOCRIT: 37.1 % (ref 36.0–46.0)
HEMOGLOBIN: 12.4 g/dL (ref 12.0–15.0)
MCH: 30.7 pg (ref 26.0–34.0)
MCHC: 33.4 g/dL (ref 30.0–36.0)
MCV: 91.8 fL (ref 78.0–100.0)
Platelets: 231 10*3/uL (ref 150–400)
RBC: 4.04 MIL/uL (ref 3.87–5.11)
RDW: 12.8 % (ref 11.5–15.5)
WBC: 5.6 10*3/uL (ref 4.0–10.5)

## 2017-11-18 ENCOUNTER — Encounter (HOSPITAL_COMMUNITY): Payer: Self-pay | Admitting: Surgery

## 2017-11-18 DIAGNOSIS — E210 Primary hyperparathyroidism: Secondary | ICD-10-CM | POA: Diagnosis present

## 2017-11-18 DIAGNOSIS — Z8639 Personal history of other endocrine, nutritional and metabolic disease: Secondary | ICD-10-CM | POA: Diagnosis present

## 2017-11-18 NOTE — H&P (Signed)
General Surgery [facility]  Ann Osborne DOB: [birth year] Married / Language: English / Race: White Female   History of Present Illness  The patient is a 67 year old female who presents with primary hyperparathyroidism.  CC: primary hyperparathyroidism  Patient is referred by Dr. [name] for evaluation and management of primary hyperparathyroidism. Patient was noted on routine laboratory studies to have an elevated serum calcium level. She does have a history of bone density scan but no history of osteopenia or osteoporosis. Patient has noted problems with memory loss. She notes chronic fatigue. She denies any history of nephrolithiasis. Laboratory studies showed an elevated serum calcium level of 10.7 and an elevated intact PTH level of 67. Vitamin D level was slightly low at 28 and she is on vitamin D therapy. Patient was referred for nuclear medicine parathyroid scan which was performed September 08, 2017. This localized a left inferior parathyroid adenoma. Patient is now referred for consideration for parathyroidectomy. Patient has had no prior head or neck surgery. She is not on any thyroid medication. Patient does have a family history of thyroid disease in a maternal aunt who had complications following her thyroid surgery with permanent hypoparathyroidism.   Allergies Compazine *ANTIPSYCHOTICS/ANTIMANIC AGENTS*  Sulfa Antibiotics  Hives. Flu like symptoms Allergies Reconciled   Medication History Omeprazole (20MG  Capsule DR, Oral) Active. Effexor (37.5MG  Tablet, Oral) Active. Vitamin D3 (1000UNIT Capsule, Oral) Active. Turmeric (400MG  Capsule, Oral) Active. PreserVision AREDS (Oral) Active. Medications Reconciled  Vitals Weight: 175 lb Height: 67in Body Surface Area: 1.91 m Body Mass Index: 27.41 kg/m  Temp.: 98.59F(Oral)  Pulse: 60 (Regular)  BP: 116/62 (Sitting, Right Arm, Standard)  Physical Exam  See  vital signs recorded above  GENERAL APPEARANCE Development: normal Nutritional status: normal Gross deformities: none  SKIN Rash, lesions, ulcers: none Induration, erythema: none Nodules: none palpable  EYES Conjunctiva and lids: normal Pupils: equal and reactive Iris: normal bilaterally  EARS, NOSE, MOUTH, THROAT External ears: no lesion or deformity External nose: no lesion or deformity Hearing: grossly normal Lips: no lesion or deformity Dentition: normal for age Oral mucosa: moist  NECK Symmetric: yes Trachea: midline Thyroid: no palpable nodules in the thyroid bed  CHEST Respiratory effort: normal Retraction or accessory muscle use: no Breath sounds: normal bilaterally Rales, rhonchi, wheeze: none  CARDIOVASCULAR Auscultation: regular rhythm, normal rate Murmurs: none Pulses: carotid and radial pulse 2+ palpable Lower extremity edema: none Lower extremity varicosities: none  MUSCULOSKELETAL Station and gait: normal Digits and nails: no clubbing or cyanosis Muscle strength: grossly normal all extremities Range of motion: grossly normal all extremities Deformity: none  LYMPHATIC Cervical: none palpable Supraclavicular: none palpable  PSYCHIATRIC Oriented to person, place, and time: yes Mood and affect: normal for situation Judgment and insight: appropriate for situation    Assessment & Plan  PRIMARY HYPERPARATHYROIDISM (E21.0)  Pt Education - Pamphlet Given - The Parathyroid Surgery Book: discussed with patient and provided information.  Patient is referred by Dr. [name] for surgical evaluation of primary hyperparathyroidism. Patient is provided with written literature on parathyroid disease to review at home. Patient is also provided with a copy of her radiologic study results and her laboratory results.  Patient has a left inferior parathyroid adenoma. We discussed minimally invasive surgery. We discussed risk and benefits of the  procedure including the potential for recurrent laryngeal nerve injury. We discussed the possibility of a second gland adenoma. We discussed the surgical procedure, the hospital stay, and the postoperative recovery.  Patient understands and wishes to proceed with surgery in the near future.  We will obtain a 24-hour urine collection for calcium to rule out Helix.  The risks and benefits of the procedure have been discussed at length with the patient. The patient understands the proposed procedure, potential alternative treatments, and the course of recovery to be expected. All of the patient's questions have been answered at this time. The patient wishes to proceed with surgery.  Armandina Gemma, [place] Office: [phone]

## 2017-11-19 ENCOUNTER — Encounter (HOSPITAL_COMMUNITY)
Admission: RE | Disposition: A | Discharge status: Home / Self Care | Payer: Self-pay | Source: Ambulatory Visit | Attending: Surgery

## 2017-11-19 ENCOUNTER — Encounter (HOSPITAL_COMMUNITY): Payer: Self-pay

## 2017-11-19 ENCOUNTER — Ambulatory Visit (HOSPITAL_COMMUNITY): Payer: Medicare Other | Admitting: Anesthesiology

## 2017-11-19 ENCOUNTER — Ambulatory Visit (HOSPITAL_COMMUNITY)
Admission: RE | Admit: 2017-11-19 | Discharge: 2017-11-19 | Disposition: A | Discharge status: Home / Self Care | Payer: Medicare Other | Source: Ambulatory Visit | Attending: Surgery | Admitting: Surgery

## 2017-11-19 DIAGNOSIS — E7800 Pure hypercholesterolemia, unspecified: Secondary | ICD-10-CM | POA: Diagnosis not present

## 2017-11-19 DIAGNOSIS — Z79899 Other long term (current) drug therapy: Secondary | ICD-10-CM | POA: Insufficient documentation

## 2017-11-19 DIAGNOSIS — G4730 Sleep apnea, unspecified: Secondary | ICD-10-CM | POA: Insufficient documentation

## 2017-11-19 DIAGNOSIS — F329 Major depressive disorder, single episode, unspecified: Secondary | ICD-10-CM | POA: Insufficient documentation

## 2017-11-19 DIAGNOSIS — R5382 Chronic fatigue, unspecified: Secondary | ICD-10-CM | POA: Diagnosis not present

## 2017-11-19 DIAGNOSIS — D351 Benign neoplasm of parathyroid gland: Secondary | ICD-10-CM | POA: Diagnosis not present

## 2017-11-19 DIAGNOSIS — D34 Benign neoplasm of thyroid gland: Secondary | ICD-10-CM | POA: Diagnosis not present

## 2017-11-19 DIAGNOSIS — K219 Gastro-esophageal reflux disease without esophagitis: Secondary | ICD-10-CM | POA: Insufficient documentation

## 2017-11-19 DIAGNOSIS — E210 Primary hyperparathyroidism: Secondary | ICD-10-CM | POA: Diagnosis not present

## 2017-11-19 DIAGNOSIS — Z8349 Family history of other endocrine, nutritional and metabolic diseases: Secondary | ICD-10-CM | POA: Diagnosis not present

## 2017-11-19 DIAGNOSIS — Z8639 Personal history of other endocrine, nutritional and metabolic disease: Secondary | ICD-10-CM | POA: Diagnosis present

## 2017-11-19 HISTORY — PX: THYROIDECTOMY: SHX17

## 2017-11-19 SURGERY — THYROIDECTOMY
Anesthesia: General | Site: Neck | Laterality: Left

## 2017-11-19 MED ORDER — FENTANYL CITRATE (PF) 100 MCG/2ML IJ SOLN
INTRAMUSCULAR | Status: AC
  Filled 2017-11-19: qty 2

## 2017-11-19 MED ORDER — TRAMADOL HCL 50 MG PO TABS
50.0000 mg | ORAL_TABLET | Freq: Once | ORAL | Status: DC | PRN
  Filled 2017-11-19: qty 1

## 2017-11-19 MED ORDER — LACTATED RINGERS IV SOLN
INTRAVENOUS | Status: DC
  Administered 2017-11-19 (×2): via INTRAVENOUS
  Administered 2017-11-19: 1000 mL via INTRAVENOUS

## 2017-11-19 MED ORDER — ROCURONIUM BROMIDE 50 MG/5ML IV SOSY
PREFILLED_SYRINGE | INTRAVENOUS | Status: AC
  Filled 2017-11-19: qty 5

## 2017-11-19 MED ORDER — CHLORHEXIDINE GLUCONATE CLOTH 2 % EX PADS: 6.0000 | MEDICATED_PAD | Freq: Once | CUTANEOUS | Status: DC

## 2017-11-19 MED ORDER — LIDOCAINE 2% (20 MG/ML) 5 ML SYRINGE
INTRAMUSCULAR | Status: AC
  Filled 2017-11-19: qty 5

## 2017-11-19 MED ORDER — 0.9 % SODIUM CHLORIDE (POUR BTL) OPTIME
TOPICAL | Status: DC | PRN
  Administered 2017-11-19: 1000 mL

## 2017-11-19 MED ORDER — CEFAZOLIN SODIUM-DEXTROSE 2-4 GM/100ML-% IV SOLN
2.0000 g | INTRAVENOUS | Status: AC
  Administered 2017-11-19: 2 g via INTRAVENOUS
  Filled 2017-11-19: qty 100

## 2017-11-19 MED ORDER — LIDOCAINE 2% (20 MG/ML) 5 ML SYRINGE
INTRAMUSCULAR | Status: DC | PRN
  Administered 2017-11-19: 80 mg via INTRAVENOUS

## 2017-11-19 MED ORDER — LACTATED RINGERS IV SOLN: INTRAVENOUS | Status: DC

## 2017-11-19 MED ORDER — ONDANSETRON HCL 4 MG/2ML IJ SOLN
INTRAMUSCULAR | Status: DC | PRN
  Administered 2017-11-19: 4 mg via INTRAVENOUS

## 2017-11-19 MED ORDER — FENTANYL CITRATE (PF) 100 MCG/2ML IJ SOLN
INTRAMUSCULAR | Status: DC | PRN
  Administered 2017-11-19: 50 ug via INTRAVENOUS
  Administered 2017-11-19: 25 ug via INTRAVENOUS
  Administered 2017-11-19: 75 ug via INTRAVENOUS
  Administered 2017-11-19: 50 ug via INTRAVENOUS

## 2017-11-19 MED ORDER — SUGAMMADEX SODIUM 200 MG/2ML IV SOLN
INTRAVENOUS | Status: DC | PRN
  Administered 2017-11-19: 200 mg via INTRAVENOUS

## 2017-11-19 MED ORDER — ROCURONIUM BROMIDE 10 MG/ML (PF) SYRINGE
PREFILLED_SYRINGE | INTRAVENOUS | Status: DC | PRN
  Administered 2017-11-19: 40 mg via INTRAVENOUS

## 2017-11-19 MED ORDER — ONDANSETRON HCL 4 MG/2ML IJ SOLN: 4.0000 mg | Freq: Once | INTRAMUSCULAR | Status: DC | PRN

## 2017-11-19 MED ORDER — SUGAMMADEX SODIUM 200 MG/2ML IV SOLN
INTRAVENOUS | Status: AC
  Filled 2017-11-19: qty 2

## 2017-11-19 MED ORDER — BUPIVACAINE HCL (PF) 0.25 % IJ SOLN
INTRAMUSCULAR | Status: DC | PRN
  Administered 2017-11-19: 10 mL

## 2017-11-19 MED ORDER — DEXAMETHASONE SODIUM PHOSPHATE 10 MG/ML IJ SOLN
INTRAMUSCULAR | Status: AC
  Filled 2017-11-19: qty 1

## 2017-11-19 MED ORDER — FENTANYL CITRATE (PF) 100 MCG/2ML IJ SOLN: 25.0000 ug | INTRAMUSCULAR | Status: DC | PRN

## 2017-11-19 MED ORDER — DEXAMETHASONE SODIUM PHOSPHATE 10 MG/ML IJ SOLN
INTRAMUSCULAR | Status: DC | PRN
  Administered 2017-11-19: 10 mg via INTRAVENOUS

## 2017-11-19 MED ORDER — PROPOFOL 10 MG/ML IV BOLUS
INTRAVENOUS | Status: AC
  Filled 2017-11-19: qty 20

## 2017-11-19 MED ORDER — MIDAZOLAM HCL 5 MG/5ML IJ SOLN
INTRAMUSCULAR | Status: DC | PRN
  Administered 2017-11-19: 2 mg via INTRAVENOUS

## 2017-11-19 MED ORDER — PROPOFOL 10 MG/ML IV BOLUS
INTRAVENOUS | Status: DC | PRN
  Administered 2017-11-19: 130 mg via INTRAVENOUS

## 2017-11-19 MED ORDER — MIDAZOLAM HCL 2 MG/2ML IJ SOLN
INTRAMUSCULAR | Status: AC
  Filled 2017-11-19: qty 2

## 2017-11-19 MED ORDER — BUPIVACAINE HCL (PF) 0.25 % IJ SOLN
INTRAMUSCULAR | Status: AC
  Filled 2017-11-19: qty 30

## 2017-11-19 MED ORDER — TRAMADOL HCL 50 MG PO TABS: 50.0000 mg | ORAL_TABLET | Freq: Four times a day (QID) | ORAL | 0 refills | Status: DC | PRN

## 2017-11-19 SURGICAL SUPPLY — 38 items
ATTRACTOMAT 16X20 MAGNETIC DRP (DRAPES) ×3 IMPLANT
BENZOIN TINCTURE PRP APPL 2/3 (GAUZE/BANDAGES/DRESSINGS) ×3 IMPLANT
BLADE SURG 15 STRL LF DISP TIS (BLADE) ×1 IMPLANT
BLADE SURG 15 STRL SS (BLADE) ×2
CHLORAPREP W/TINT 26ML (MISCELLANEOUS) ×6 IMPLANT
CLIP VESOCCLUDE MED 6/CT (CLIP) ×6 IMPLANT
CLIP VESOCCLUDE SM WIDE 6/CT (CLIP) ×6 IMPLANT
CLOSURE STERI-STRIP 1/4X4 (GAUZE/BANDAGES/DRESSINGS) ×3 IMPLANT
CLOSURE WOUND 1/2 X4 (GAUZE/BANDAGES/DRESSINGS) ×1
COVER SURGICAL LIGHT HANDLE (MISCELLANEOUS) ×3 IMPLANT
DISSECTOR ROUND CHERRY 3/8 STR (MISCELLANEOUS) IMPLANT
DRAPE LAPAROTOMY T 98X78 PEDS (DRAPES) ×3 IMPLANT
ELECT PENCIL ROCKER SW 15FT (MISCELLANEOUS) ×3 IMPLANT
ELECT REM PT RETURN 15FT ADLT (MISCELLANEOUS) ×3 IMPLANT
GAUZE SPONGE 4X4 12PLY STRL (GAUZE/BANDAGES/DRESSINGS) ×3 IMPLANT
GAUZE SPONGE 4X4 16PLY XRAY LF (GAUZE/BANDAGES/DRESSINGS) ×3 IMPLANT
GLOVE SURG ORTHO 8.0 STRL STRW (GLOVE) ×3 IMPLANT
GOWN STRL REUS W/TWL XL LVL3 (GOWN DISPOSABLE) ×6 IMPLANT
HEMOSTAT SURGICEL 2X4 FIBR (HEMOSTASIS) IMPLANT
ILLUMINATOR WAVEGUIDE N/F (MISCELLANEOUS) IMPLANT
KIT BASIN OR (CUSTOM PROCEDURE TRAY) ×3 IMPLANT
LIGHT WAVEGUIDE WIDE FLAT (MISCELLANEOUS) IMPLANT
PACK BASIC VI WITH GOWN DISP (CUSTOM PROCEDURE TRAY) ×3 IMPLANT
POWDER SURGICEL 3.0 GRAM (HEMOSTASIS) IMPLANT
SHEARS HARMONIC 9CM CVD (BLADE) ×3 IMPLANT
STAPLER VISISTAT 35W (STAPLE) IMPLANT
STRIP CLOSURE SKIN 1/2X4 (GAUZE/BANDAGES/DRESSINGS) ×2 IMPLANT
SUT MNCRL AB 4-0 PS2 18 (SUTURE) ×3 IMPLANT
SUT SILK 2 0 (SUTURE)
SUT SILK 2-0 18XBRD TIE 12 (SUTURE) IMPLANT
SUT SILK 3 0 (SUTURE)
SUT SILK 3-0 18XBRD TIE 12 (SUTURE) IMPLANT
SUT VIC AB 3-0 SH 18 (SUTURE) ×6 IMPLANT
SYR BULB IRRIGATION 50ML (SYRINGE) ×3 IMPLANT
TAPE CLOTH SURG 4X10 WHT LF (GAUZE/BANDAGES/DRESSINGS) ×3 IMPLANT
TOWEL OR 17X26 10 PK STRL BLUE (TOWEL DISPOSABLE) ×3 IMPLANT
TOWEL OR NON WOVEN STRL DISP B (DISPOSABLE) ×3 IMPLANT
YANKAUER SUCT BULB TIP 10FT TU (MISCELLANEOUS) ×3 IMPLANT

## 2017-11-19 NOTE — Transfer of Care (Signed)
Immediate Anesthesia Transfer of Care Note  Patient: Ann Osborne  Procedure(s) Performed: LEFT INFERIOR THYROIDECTOMY (Left Neck)  Patient Location: PACU  Anesthesia Type:General  Level of Consciousness: awake, alert  and oriented  Airway & Oxygen Therapy: Patient Spontanous Breathing and Patient connected to face mask oxygen  Post-op Assessment: Report given to RN  Post vital signs: Reviewed and stable  Last Vitals:  Vitals:   11/19/17 0651  BP: (!) 97/57  Pulse: (!) 58  Resp: 18  Temp: 36.7 C  SpO2: 98%    Last Pain:  Vitals:   11/19/17 0651  TempSrc: Oral      Patients Stated Pain Goal: 4 (18/48/59 2763)  Complications: No apparent anesthesia complications

## 2017-11-19 NOTE — Interval H&P Note (Signed)
History and Physical Interval Note:  11/19/2017 8:35 AM  Ann Osborne  has presented today for surgery, with the diagnosis of PRIMARY HYPERPARATHYROIDISM.  The various methods of treatment have been discussed with the patient and family. After consideration of risks, benefits and other options for treatment, the patient has consented to    Left inferior parathyroidectomy as a surgical intervention .    The patient's history has been reviewed, patient examined, no change in status, stable for surgery.  I have reviewed the patient's chart and labs.  Questions were answered to the patient's satisfaction.    Armandina Gemma, [place] [facility] Office: [place]

## 2017-11-19 NOTE — Anesthesia Postprocedure Evaluation (Signed)
Anesthesia Post Note  Patient: Ann Osborne  Procedure(s) Performed: LEFT INFERIOR THYROIDECTOMY (Left Neck)     Patient location during evaluation: PACU Anesthesia Type: General Level of consciousness: awake and alert Pain management: pain level controlled Vital Signs Assessment: post-procedure vital signs reviewed and stable Respiratory status: spontaneous breathing, nonlabored ventilation, respiratory function stable and patient connected to nasal cannula oxygen Cardiovascular status: blood pressure returned to baseline and stable Postop Assessment: no apparent nausea or vomiting Anesthetic complications: no    Last Vitals:  Vitals:   11/19/17 1214 11/19/17 1255  BP:  (!) 112/59  Pulse: 63 62  Resp:  16  Temp:  36.6 C  SpO2: 98% 98%    Last Pain:  Vitals:   11/19/17 1255  TempSrc:   PainSc: 1                  Tiajuana Amass

## 2017-11-19 NOTE — Anesthesia Procedure Notes (Signed)
Procedure Name: Intubation Date/Time: 11/19/2017 9:35 AM Performed by: Lavina Hamman, CRNA Pre-anesthesia Checklist: Patient identified, Emergency Drugs available, Suction available, Patient being monitored and Timeout performed Patient Re-evaluated:Patient Re-evaluated prior to induction Oxygen Delivery Method: Circle system utilized Preoxygenation: Pre-oxygenation with 100% oxygen Induction Type: IV induction Ventilation: Mask ventilation without difficulty Laryngoscope Size: Mac and 4 Grade View: Grade II Tube type: Oral Tube size: 7.0 mm Number of attempts: 1 Airway Equipment and Method: Stylet Placement Confirmation: ETT inserted through vocal cords under direct vision,  positive ETCO2,  CO2 detector and breath sounds checked- equal and bilateral Secured at: 21 cm Tube secured with: Tape Dental Injury: Teeth and Oropharynx as per pre-operative assessment

## 2017-11-19 NOTE — Anesthesia Preprocedure Evaluation (Signed)
Anesthesia Evaluation  Patient identified by MRN, date of birth, ID band Patient awake    Reviewed: Allergy & Precautions, NPO status   Airway Mallampati: II  TM Distance: >3 FB Neck ROM: Full    Dental  (+) Dental Advisory Given   Pulmonary sleep apnea ,    breath sounds clear to auscultation       Cardiovascular negative cardio ROS   Rhythm:Regular Rate:Normal     Neuro/Psych Depression negative neurological ROS     GI/Hepatic Neg liver ROS, GERD  Medicated,  Endo/Other  Hyperparathyroidism  Renal/GU negative Renal ROS     Musculoskeletal   Abdominal   Peds  Hematology negative hematology ROS (+)   Anesthesia Other Findings   Reproductive/Obstetrics                             Lab Results  Component Value Date   WBC 5.6 11/09/2017   HGB 12.4 11/09/2017   HCT 37.1 11/09/2017   MCV 91.8 11/09/2017   PLT 231 11/09/2017   Lab Results  Component Value Date   CREATININE 0.84 10/29/2017   BUN 15 10/29/2017   NA 139 10/29/2017   K 4.5 10/29/2017   CL 104 10/29/2017   CO2 29 10/29/2017    Anesthesia Physical Anesthesia Plan  ASA: II  Anesthesia Plan: General   Post-op Pain Management:    Induction: Intravenous  PONV Risk Score and Plan:   Airway Management Planned: Oral ETT  Additional Equipment:   Intra-op Plan:   Post-operative Plan: Extubation in OR  Informed Consent: I have reviewed the patients History and Physical, chart, labs and discussed the procedure including the risks, benefits and alternatives for the proposed anesthesia with the patient or authorized representative who has indicated his/her understanding and acceptance.   Dental advisory given  Plan Discussed with: CRNA  Anesthesia Plan Comments:         Anesthesia Quick Evaluation

## 2017-11-19 NOTE — Op Note (Signed)
OPERATIVE REPORT - PARATHYROIDECTOMY  Preoperative diagnosis: Primary hyperparathyroidism  Postop diagnosis: Same  Procedure: Left inferior minimally invasive parathyroidectomy  Surgeon:  Armandina Gemma, MD  Anesthesia: General endotracheal  Estimated blood loss: Minimal  Preparation: ChloraPrep  Indications: Patient is referred by Dr. [name] for evaluation and management of primary hyperparathyroidism. Patient was noted on routine laboratory studies to have an elevated serum calcium level. She does have a history of bone density scan but no history of osteopenia or osteoporosis. Patient has noted problems with memory loss. She notes chronic fatigue. She denies any history of nephrolithiasis. Laboratory studies showed an elevated serum calcium level of 10.7 and an elevated intact PTH level of 67. Vitamin D level was slightly low at 28 and she is on vitamin D therapy. Patient was referred for nuclear medicine parathyroid scan which was performed September 08, 2017. This localized a left inferior parathyroid adenoma. Patient is now referred for consideration for parathyroidectomy.   Procedure: The patient was prepared in the pre-operative holding area. The patient was brought to the operating room and placed in a supine position on the operating room table. Following administration of general anesthesia, the patient was positioned and then prepped and draped in the usual strict aseptic fashion. After ascertaining that an adequate level of anesthesia been achieved, a neck incision was made with a #15 blade. Dissection was carried through subcutaneous tissues and platysma. Hemostasis was obtained with the electrocautery. Skin flaps were developed circumferentially and a Weitlander retractor was placed for exposure.  Strap muscles were incised in the midline. Strap muscles were reflected lateralley exposing the thyroid lobe. With gentle blunt dissection the thyroid lobe was mobilized.   Dissection was carried through adipose tissue and a small nodular appearing parathyroid gland was identified at the inferior pole of the left thyroid lobe consistent with the nuclear medicine parathyroid scan. It was gently mobilized. Vascular structures were divided between small ligaclips. Care was taken to avoid the recurrent laryngeal nerve and the esophagus. The parathyroid gland was completely excised. It was submitted to pathology where frozen section by Dr. Claudette Laws confirmed parathyroid tissue consistent with adenoma.  Neck was irrigated with warm saline and good hemostasis was noted. Fibrillar was placed in the operative field. Strap muscles were reapproximated in the midline with interrupted 3-0 Vicryl sutures. Platysma was closed with interrupted 3-0 Vicryl sutures. Skin was closed with a running 4-0 Monocryl subcuticular suture. Marcaine was infiltrated circumferentially. Wound was washed and dried and Steristrips were applied. Patient was awakened from anesthesia and brought to the recovery room. The patient tolerated the procedure well.   Armandina Gemma, MD [facility] Office: [phone]

## 2017-11-20 ENCOUNTER — Encounter (HOSPITAL_COMMUNITY): Payer: Self-pay | Admitting: Surgery

## 2017-12-08 DIAGNOSIS — E210 Primary hyperparathyroidism: Secondary | ICD-10-CM | POA: Diagnosis not present

## 2018-01-18 ENCOUNTER — Encounter: Payer: Self-pay | Admitting: General Surgery

## 2018-02-11 ENCOUNTER — Other Ambulatory Visit: Payer: Self-pay | Admitting: Internal Medicine

## 2018-02-11 DIAGNOSIS — Z1231 Encounter for screening mammogram for malignant neoplasm of breast: Secondary | ICD-10-CM

## 2018-03-01 ENCOUNTER — Ambulatory Visit
Admission: RE | Admit: 2018-03-01 | Discharge: 2018-03-01 | Disposition: A | Discharge status: Home / Self Care | Payer: Medicare Other | Source: Ambulatory Visit | Attending: Internal Medicine | Admitting: Internal Medicine

## 2018-03-01 DIAGNOSIS — Z1231 Encounter for screening mammogram for malignant neoplasm of breast: Secondary | ICD-10-CM | POA: Diagnosis not present

## 2018-04-22 ENCOUNTER — Telehealth: Payer: Self-pay | Admitting: Internal Medicine

## 2018-04-22 NOTE — Telephone Encounter (Signed)
Copied from [place]. Topic: Quick Communication - Rx Refill/Question >> Apr 22, 2018 10:24 AM Synthia Innocent wrote: Medication: omeprazole (PRILOSEC) 20 MG capsule and venlafaxine XR (EFFEXOR-XR) 37.5 MG 24 hr capsule  Has the patient contacted their pharmacy? Yes.   (Agent: If no, request that the patient contact the pharmacy for the refill.) (Agent: If yes, when and what did the pharmacy advise?)  Preferred Pharmacy (with phone number or street name): [facility]  Agent: Please be advised that RX refills may take up to 3 business days. We ask that you follow-up with your pharmacy.

## 2018-04-22 NOTE — Telephone Encounter (Signed)
Left VM, patient needs appointment, was to have f/u 12/24/17

## 2018-04-26 ENCOUNTER — Telehealth: Payer: Self-pay | Admitting: Internal Medicine

## 2018-04-26 ENCOUNTER — Other Ambulatory Visit: Payer: Self-pay | Admitting: *Deleted

## 2018-04-26 MED ORDER — VENLAFAXINE HCL ER 37.5 MG PO CP24: 37.5000 mg | ORAL_CAPSULE | Freq: Every day | ORAL | 1 refills | Status: DC

## 2018-04-26 MED ORDER — OMEPRAZOLE 20 MG PO CPDR: 20.0000 mg | DELAYED_RELEASE_CAPSULE | Freq: Every day | ORAL | 1 refills | Status: DC

## 2018-04-26 NOTE — Telephone Encounter (Signed)
Copied from [place] [id number]. Topic: Quick Communication - Rx Refill/Question >> Apr 26, 2018 10:30 AM Celedonio Savage L wrote: Medication: omeprazole (PRILOSEC) 20 MG capsule  venlafaxine XR (EFFEXOR-XR) 37.5 MG 24 hr capsule   Was told by the practice to put in a request because I couldn't put pt into a appt slot due to being full and if they have to be seen they will put her in at the office    Has the patient contacted their pharmacy? No.  They reached out to the pt (Agent: If no, request that the patient contact the pharmacy for the refill.) (Agent: If yes, when and what did the pharmacy advise?)  Preferred Pharmacy (with phone number or street name):     [place], [place] [phone] (Phone) [phone] (Fax)      Agent: Please be advised that RX refills may take up to 3 business days. We ask that you follow-up with your pharmacy.

## 2018-04-26 NOTE — Telephone Encounter (Signed)
Rx sent to pharmacy per protocol. LOV: 10/29/17

## 2018-05-10 ENCOUNTER — Encounter: Payer: Self-pay | Admitting: Internal Medicine

## 2018-09-02 ENCOUNTER — Encounter: Payer: Self-pay | Admitting: Internal Medicine

## 2018-09-02 ENCOUNTER — Ambulatory Visit (INDEPENDENT_AMBULATORY_CARE_PROVIDER_SITE_OTHER): Payer: Medicare Other | Admitting: Internal Medicine

## 2018-09-02 ENCOUNTER — Ambulatory Visit (INDEPENDENT_AMBULATORY_CARE_PROVIDER_SITE_OTHER): Payer: Medicare Other

## 2018-09-02 VITALS — BP 118/70 | HR 70 | Temp 97.9°F | Resp 18 | Wt 175.8 lb

## 2018-09-02 DIAGNOSIS — G4730 Sleep apnea, unspecified: Secondary | ICD-10-CM

## 2018-09-02 DIAGNOSIS — E210 Primary hyperparathyroidism: Secondary | ICD-10-CM

## 2018-09-02 DIAGNOSIS — F329 Major depressive disorder, single episode, unspecified: Secondary | ICD-10-CM | POA: Diagnosis not present

## 2018-09-02 DIAGNOSIS — E7800 Pure hypercholesterolemia, unspecified: Secondary | ICD-10-CM

## 2018-09-02 DIAGNOSIS — R05 Cough: Secondary | ICD-10-CM

## 2018-09-02 DIAGNOSIS — R059 Cough, unspecified: Secondary | ICD-10-CM

## 2018-09-02 DIAGNOSIS — R053 Chronic cough: Secondary | ICD-10-CM

## 2018-09-02 DIAGNOSIS — E538 Deficiency of other specified B group vitamins: Secondary | ICD-10-CM

## 2018-09-02 DIAGNOSIS — Z23 Encounter for immunization: Secondary | ICD-10-CM | POA: Diagnosis not present

## 2018-09-02 DIAGNOSIS — F32A Depression, unspecified: Secondary | ICD-10-CM

## 2018-09-02 LAB — CBC WITH DIFFERENTIAL/PLATELET
Basophils Absolute: 0 10*3/uL (ref 0.0–0.1)
Basophils Relative: 0.6 % (ref 0.0–3.0)
Eosinophils Absolute: 0.1 10*3/uL (ref 0.0–0.7)
Eosinophils Relative: 1.4 % (ref 0.0–5.0)
HCT: 38.8 % (ref 36.0–46.0)
Hemoglobin: 13.2 g/dL (ref 12.0–15.0)
LYMPHS ABS: 1.8 10*3/uL (ref 0.7–4.0)
LYMPHS PCT: 35.9 % (ref 12.0–46.0)
MCHC: 34 g/dL (ref 30.0–36.0)
MCV: 91.6 fl (ref 78.0–100.0)
MONOS PCT: 8.9 % (ref 3.0–12.0)
Monocytes Absolute: 0.4 10*3/uL (ref 0.1–1.0)
NEUTROS PCT: 53.2 % (ref 43.0–77.0)
Neutro Abs: 2.7 10*3/uL (ref 1.4–7.7)
Platelets: 252 10*3/uL (ref 150.0–400.0)
RBC: 4.24 Mil/uL (ref 3.87–5.11)
RDW: 13.1 % (ref 11.5–15.5)
WBC: 5 10*3/uL (ref 4.0–10.5)

## 2018-09-02 LAB — BASIC METABOLIC PANEL
BUN: 11 mg/dL (ref 6–23)
CALCIUM: 9.3 mg/dL (ref 8.4–10.5)
CO2: 27 mEq/L (ref 19–32)
Chloride: 103 mEq/L (ref 96–112)
Creatinine, Ser: 0.88 mg/dL (ref 0.40–1.20)
GFR: 67.58 mL/min (ref >60.00)
GLUCOSE: 95 mg/dL (ref 70–99)
Potassium: 4.4 mEq/L (ref 3.5–5.1)
Sodium: 138 mEq/L (ref 135–145)

## 2018-09-02 LAB — LIPID PANEL
CHOL/HDL RATIO: 6
Cholesterol: 230 mg/dL — ABNORMAL HIGH (ref 0–200)
HDL: 38.5 mg/dL — ABNORMAL LOW (ref >39.00)
LDL Cholesterol: 168 mg/dL — ABNORMAL HIGH (ref 0–99)
NONHDL: 191.09
Triglycerides: 117 mg/dL (ref 0.0–149.0)
VLDL: 23.4 mg/dL (ref 0.0–40.0)

## 2018-09-02 LAB — HEPATIC FUNCTION PANEL
ALBUMIN: 4.5 g/dL (ref 3.5–5.2)
ALK PHOS: 62 U/L (ref 39–117)
ALT: 15 U/L (ref 0–35)
AST: 17 U/L (ref 0–37)
BILIRUBIN DIRECT: 0.1 mg/dL (ref 0.0–0.3)
BILIRUBIN TOTAL: 0.7 mg/dL (ref 0.2–1.2)
Total Protein: 7.8 g/dL (ref 6.0–8.3)

## 2018-09-02 LAB — VITAMIN B12: Vitamin B-12: 508 pg/mL (ref 211–911)

## 2018-09-02 LAB — TSH: TSH: 1.39 u[IU]/mL (ref 0.35–4.50)

## 2018-09-02 MED ORDER — OMEPRAZOLE 20 MG PO CPDR: 20.0000 mg | DELAYED_RELEASE_CAPSULE | Freq: Every day | ORAL | 1 refills | Status: DC

## 2018-09-02 MED ORDER — VENLAFAXINE HCL ER 37.5 MG PO CP24: 37.5000 mg | ORAL_CAPSULE | Freq: Every day | ORAL | 1 refills | Status: DC

## 2018-09-02 MED ORDER — OMEPRAZOLE 20 MG PO CPDR: 20.0000 mg | DELAYED_RELEASE_CAPSULE | Freq: Two times a day (BID) | ORAL | 1 refills | Status: DC

## 2018-09-02 NOTE — Progress Notes (Signed)
[name], female   DOB: 1951/04/25, 67 y.o.   MRN: [id number]   Subjective:    [name], female    DOB: May 15, 1951, 67 y.o.   MRN: [id number]  HPI  Patient here for a scheduled follow up.  She reports she is doing better.  Increased stress, but feels she is handling things relatively well.  Stays active.  No chest pain.  No sob.  Does report some increased cough and "congestion" early in the am.  May last for 30 minutes and then resolves.  No persistent cough throughout the day.  No fever.  No wheezing.  Does not feel bad.  Discussed allergies. Denies drainage or nasal congestion.  Some acid reflux.  No abdominal pain.  Bowels moving.  No chest pain or sob.     Past Medical History:  Diagnosis Date  . ADD (attention deficit disorder)   . Allergy   . Depression   . GERD (gastroesophageal reflux disease)   . Hyperparathyroidism ([place])   . Lyme disease 6-7 YEARS   history   Past Surgical History:  Procedure Laterality Date  . BREAST BIOPSY Left 20 yrs ago  . EYE SURGERY Bilateral 2007 OR 2008   LENS REPLACEMENT   . THYROIDECTOMY Left 11/19/2017   Procedure: LEFT INFERIOR THYROIDECTOMY;  Surgeon: Armandina Gemma, MD;  Location: [facility];  Service: General;  Laterality: Left;  . TUBAL LIGATION  1978   bilateral  . WISDOM TOOTH EXTRACTION  1980   Family History  Problem Relation Age of Onset  . Uterine cancer Mother   . Heart disease Maternal Grandmother   . Alcohol abuse Maternal Grandfather   . Parkinson's disease Paternal Grandmother   . Depression Paternal Grandfather   . Breast cancer Neg Hx    Social History   Socioeconomic History  . Marital status: Widowed    Spouse name: Not on file  . Number of children: 2  . Years of education: Not on file  . Highest education level: Not on file  Occupational History  . Not on file  Social Needs  . Financial resource strain: Not on file  . Food insecurity:    Worry: Not on file    Inability: Not  on file  . Transportation needs:    Medical: Not on file    Non-medical: Not on file  Tobacco Use  . Smoking status: Never Smoker  . Smokeless tobacco: Never Used  Substance and Sexual Activity  . Alcohol use: Yes    Alcohol/week: 0.0 standard drinks    Comment: occasional  . Drug use: No  . Sexual activity: Yes  Lifestyle  . Physical activity:    Days per week: Not on file    Minutes per session: Not on file  . Stress: Not on file  Relationships  . Social connections:    Talks on phone: Not on file    Gets together: Not on file    Attends religious service: Not on file    Active member of club or organization: Not on file    Attends meetings of clubs or organizations: Not on file    Relationship status: Not on file  Other Topics Concern  . Not on file  Social History Narrative  . Not on file    Outpatient Encounter Medications as of 09/02/2018  Medication Sig  . Alpha-Lipoic Acid 50 MG TABS Take by mouth daily.  . Cholecalciferol (VITAMIN D3) 1000 units CAPS Take by  mouth daily.  . Multiple Vitamins-Minerals (ICAPS AREDS 2 PO) Take by mouth 2 (two) times daily.  . Multiple Vitamins-Minerals (MEGA MULTIVITAMIN FOR WOMEN PO) Take by mouth daily.  Marland Kitchen omeprazole (PRILOSEC) 20 MG capsule Take 1 capsule (20 mg total) by mouth 2 (two) times daily before a meal.  . TURMERIC PO Take 1,000 mg by mouth daily.  Marland Kitchen venlafaxine XR (EFFEXOR-XR) 37.5 MG 24 hr capsule Take 1 capsule (37.5 mg total) by mouth daily.  . vitamin B-12 (CYANOCOBALAMIN) 1000 MCG tablet Take 1,000 mcg by mouth daily.  . [DISCONTINUED] omeprazole (PRILOSEC) 20 MG capsule Take 1 capsule (20 mg total) by mouth daily.  . [DISCONTINUED] omeprazole (PRILOSEC) 20 MG capsule Take 1 capsule (20 mg total) by mouth daily.  . [DISCONTINUED] venlafaxine XR (EFFEXOR-XR) 37.5 MG 24 hr capsule Take 1 capsule (37.5 mg total) by mouth daily.  . [DISCONTINUED] traMADol (ULTRAM) 50 MG tablet Take 1-2 tablets (50-100 mg total) by  mouth every 6 (six) hours as needed for moderate pain.   No facility-administered encounter medications on file as of 09/02/2018.     Review of Systems  Constitutional: Negative for appetite change and unexpected weight change.  HENT: Negative for congestion and sinus pressure.   Respiratory: Positive for cough. Negative for chest tightness and shortness of breath.   Cardiovascular: Negative for chest pain, palpitations and leg swelling.  Gastrointestinal: Negative for abdominal pain, diarrhea and nausea.  Genitourinary: Negative for difficulty urinating and dysuria.  Musculoskeletal: Negative for joint swelling and myalgias.  Skin: Negative for color change and rash.  Neurological: Negative for dizziness, light-headedness and headaches.  Psychiatric/Behavioral: Negative for agitation and dysphoric mood.       Objective:    Physical Exam  Constitutional: She appears well-developed and well-nourished. No distress.  HENT:  Nose: Nose normal.  Mouth/Throat: Oropharynx is clear and moist.  Neck: Neck supple. No thyromegaly present.  Cardiovascular: Normal rate and regular rhythm.  Pulmonary/Chest: Breath sounds normal. No respiratory distress. She has no wheezes.  Abdominal: Soft. Bowel sounds are normal. There is no tenderness.  Musculoskeletal: She exhibits no edema or tenderness.  Lymphadenopathy:    She has no cervical adenopathy.  Skin: No rash noted. No erythema.  Psychiatric: She has a normal mood and affect. Her behavior is normal.    BP 118/70 (BP Location: Left Arm, Patient Position: Sitting, Cuff Size: Normal)   Pulse 70   Temp 97.9 F (36.6 C) (Oral)   Resp 18   Wt 175 lb 12.8 oz (79.7 kg)   LMP 07/16/1995 (LMP Unknown)   SpO2 98%   BMI 27.53 kg/m  Wt Readings from Last 3 Encounters:  09/02/18 175 lb 12.8 oz (79.7 kg)  11/19/17 174 lb (78.9 kg)  11/09/17 174 lb (78.9 kg)     Lab Results  Component Value Date   WBC 5.0 09/02/2018   HGB 13.2 09/02/2018     HCT 38.8 09/02/2018   PLT 252.0 09/02/2018   GLUCOSE 95 09/02/2018   CHOL 230 (H) 09/02/2018   TRIG 117.0 09/02/2018   HDL 38.50 (L) 09/02/2018   LDLDIRECT 131.4 08/17/2014   LDLCALC 168 (H) 09/02/2018   ALT 15 09/02/2018   AST 17 09/02/2018   NA 138 09/02/2018   K 4.4 09/02/2018   CL 103 09/02/2018   CREATININE 0.88 09/02/2018   BUN 11 09/02/2018   CO2 27 09/02/2018   TSH 1.39 09/02/2018    Mm Screening Breast Tomo Bilateral  Result Date: 03/01/2018 CLINICAL DATA:  Screening. EXAM: DIGITAL SCREENING BILATERAL MAMMOGRAM WITH TOMO AND CAD COMPARISON:  Previous exam(s). ACR Breast Density Category b: There are scattered areas of fibroglandular density. FINDINGS: There are no findings suspicious for malignancy. Images were processed with CAD. IMPRESSION: No mammographic evidence of malignancy. A result letter of this screening mammogram will be mailed directly to the patient. RECOMMENDATION: Screening mammogram in one year. (Code:SM-B-01Y) BI-RADS CATEGORY  1: Negative. Electronically Signed   By: Lajean Manes M.D.   On: 03/01/2018 16:19       Assessment & Plan:   Problem List Items Addressed This Visit    Cough    Occurs in the am and last approximately 30 minutes.  No fever. No evidence of infection.  Check cxr.  Treat acid reflux.  Omeprazole bid.  Follow.  Get her back in to reassess.        Depression    Increased stress. On effexor.  Overall she feels she is handling things relatively well.  Doing better.  Follow.        Relevant Medications   venlafaxine XR (EFFEXOR-XR) 37.5 MG 24 hr capsule   Hypercholesterolemia - Primary    Low cholesterol diet and exercise.  Follow lipid panel.        Relevant Orders   CBC with Differential/Platelet (Completed)   Hepatic function panel (Completed)   Lipid panel (Completed)   TSH (Completed)   Basic metabolic panel (Completed)   Hyperparathyroidism, primary (HCC)    S/p left parathyroidectomy - Dr Harlow Asa.  Doing well.   Follow.  Check calcium.        Low serum vitamin B12   Relevant Orders   Vitamin B12 (Completed)   Sleep apnea    Uses cpap.         Other Visit Diagnoses    Persistent cough       Relevant Orders   DG Chest 2 View (Completed)   Encounter for immunization       Relevant Orders   Flu vaccine HIGH DOSE PF (Completed)       [name], MD

## 2018-09-03 ENCOUNTER — Encounter: Payer: Self-pay | Admitting: Internal Medicine

## 2018-09-05 ENCOUNTER — Encounter: Payer: Self-pay | Admitting: Internal Medicine

## 2018-09-05 DIAGNOSIS — R05 Cough: Secondary | ICD-10-CM | POA: Insufficient documentation

## 2018-09-05 DIAGNOSIS — R059 Cough, unspecified: Secondary | ICD-10-CM | POA: Insufficient documentation

## 2018-09-05 NOTE — Assessment & Plan Note (Signed)
Low cholesterol diet and exercise.  Follow lipid panel.   

## 2018-09-05 NOTE — Assessment & Plan Note (Signed)
Occurs in the am and last approximately 30 minutes.  No fever. No evidence of infection.  Check cxr.  Treat acid reflux.  Omeprazole bid.  Follow.  Get her back in to reassess.

## 2018-09-05 NOTE — Assessment & Plan Note (Signed)
S/p left parathyroidectomy - Dr Harlow Asa.  Doing well.  Follow.  Check calcium.

## 2018-09-05 NOTE — Assessment & Plan Note (Signed)
Uses cpap

## 2018-09-05 NOTE — Assessment & Plan Note (Signed)
Increased stress. On effexor.  Overall she feels she is handling things relatively well.  Doing better.  Follow.

## 2018-11-01 ENCOUNTER — Ambulatory Visit: Payer: Self-pay | Admitting: Internal Medicine

## 2018-11-01 ENCOUNTER — Ambulatory Visit: Payer: Self-pay

## 2018-12-13 ENCOUNTER — Ambulatory Visit: Payer: Self-pay

## 2018-12-15 ENCOUNTER — Encounter: Payer: Self-pay | Admitting: Internal Medicine

## 2019-01-06 ENCOUNTER — Ambulatory Visit (INDEPENDENT_AMBULATORY_CARE_PROVIDER_SITE_OTHER): Payer: Medicare Other | Admitting: Internal Medicine

## 2019-01-06 ENCOUNTER — Encounter: Payer: Self-pay | Admitting: Internal Medicine

## 2019-01-06 VITALS — BP 130/70 | HR 69 | Temp 97.8°F | Resp 16 | Wt 180.6 lb

## 2019-01-06 DIAGNOSIS — E7800 Pure hypercholesterolemia, unspecified: Secondary | ICD-10-CM | POA: Diagnosis not present

## 2019-01-06 DIAGNOSIS — F32A Depression, unspecified: Secondary | ICD-10-CM

## 2019-01-06 DIAGNOSIS — G4730 Sleep apnea, unspecified: Secondary | ICD-10-CM

## 2019-01-06 DIAGNOSIS — F320 Major depressive disorder, single episode, mild: Secondary | ICD-10-CM | POA: Diagnosis not present

## 2019-01-06 DIAGNOSIS — R0602 Shortness of breath: Secondary | ICD-10-CM

## 2019-01-06 DIAGNOSIS — E210 Primary hyperparathyroidism: Secondary | ICD-10-CM | POA: Diagnosis not present

## 2019-01-06 NOTE — Progress Notes (Signed)
[name], female   DOB: 1951/03/29, 68 y.o.   MRN: [id number]   Subjective:    [name], female    DOB: [birth year], 68 y.o.   MRN: [id number]  HPI  Patient here as a work in appt with concerns regarding increased fatigue.  She reports increased stress.  Discussed family issues and stress.  Increased fatigue.  Felt to be multifactorial.  No chest pain.  Does report noticing some sob.  Not exercising regularly.  Has started walking.  Feels better with exercise.  Discussed diet and exercise.  No acid reflux.  No abdominal pain.  Bowels moving.  No urine change.  Some nasal irritation.  Has known sleep apnea.  Using cpap.  Still waking up feeling "not rested".  Increased fatigue and daytime somnolence.     Past Medical History:  Diagnosis Date  . ADD (attention deficit disorder)   . Allergy   . Depression   . GERD (gastroesophageal reflux disease)   . Hyperparathyroidism ([place])   . Lyme disease 6-7 YEARS   history   Past Surgical History:  Procedure Laterality Date  . BREAST BIOPSY Left 20 yrs ago  . EYE SURGERY Bilateral 2007 OR 2008   LENS REPLACEMENT   . THYROIDECTOMY Left 11/19/2017   Procedure: LEFT INFERIOR THYROIDECTOMY;  Surgeon: Armandina Gemma, MD;  Location: [facility];  Service: General;  Laterality: Left;  . TUBAL LIGATION  1978   bilateral  . WISDOM TOOTH EXTRACTION  1980   Family History  Problem Relation Age of Onset  . Uterine cancer Mother   . Heart disease Maternal Grandmother   . Alcohol abuse Maternal Grandfather   . Parkinson's disease Paternal Grandmother   . Depression Paternal Grandfather   . Breast cancer Neg Hx    Social History   Socioeconomic History  . Marital status: Widowed    Spouse name: Not on file  . Number of children: 2  . Years of education: Not on file  . Highest education level: Not on file  Occupational History  . Not on file  Social Needs  . Financial resource strain: Not on file  . Food insecurity:      Worry: Not on file    Inability: Not on file  . Transportation needs:    Medical: Not on file    Non-medical: Not on file  Tobacco Use  . Smoking status: Never Smoker  . Smokeless tobacco: Never Used  Substance and Sexual Activity  . Alcohol use: Yes    Alcohol/week: 0.0 standard drinks    Comment: occasional  . Drug use: No  . Sexual activity: Yes  Lifestyle  . Physical activity:    Days per week: Not on file    Minutes per session: Not on file  . Stress: Not on file  Relationships  . Social connections:    Talks on phone: Not on file    Gets together: Not on file    Attends religious service: Not on file    Active member of club or organization: Not on file    Attends meetings of clubs or organizations: Not on file    Relationship status: Not on file  Other Topics Concern  . Not on file  Social History Narrative  . Not on file    Outpatient Encounter Medications as of 01/06/2019  Medication Sig  . Alpha-Lipoic Acid 50 MG TABS Take by mouth daily.  . Cholecalciferol (VITAMIN D3) 1000 units CAPS Take  by mouth daily.  . Multiple Vitamins-Minerals (ICAPS AREDS 2 PO) Take by mouth 2 (two) times daily.  . Multiple Vitamins-Minerals (MEGA MULTIVITAMIN FOR WOMEN PO) Take by mouth daily.  Marland Kitchen omeprazole (PRILOSEC) 20 MG capsule Take 1 capsule (20 mg total) by mouth 2 (two) times daily before a meal.  . TURMERIC PO Take 1,000 mg by mouth daily.  Marland Kitchen venlafaxine XR (EFFEXOR-XR) 37.5 MG 24 hr capsule Take 1 capsule (37.5 mg total) by mouth daily.  . vitamin B-12 (CYANOCOBALAMIN) 1000 MCG tablet Take 1,000 mcg by mouth daily.   No facility-administered encounter medications on file as of 01/06/2019.     Review of Systems  Constitutional: Positive for fatigue. Negative for appetite change and unexpected weight change.  HENT: Negative for congestion and sinus pressure.   Respiratory: Positive for shortness of breath. Negative for cough and chest tightness.   Cardiovascular:  Negative for chest pain, palpitations and leg swelling.  Gastrointestinal: Negative for abdominal pain, diarrhea, nausea and vomiting.  Genitourinary: Negative for difficulty urinating and dysuria.  Musculoskeletal: Negative for joint swelling and myalgias.  Skin: Negative for color change and rash.  Neurological: Negative for dizziness, light-headedness and headaches.  Psychiatric/Behavioral: Negative for agitation and dysphoric mood.       Increased stress.         Objective:    Physical Exam Constitutional:      General: She is not in acute distress.    Appearance: Normal appearance.  HENT:     Nose: Nose normal. No congestion.     Mouth/Throat:     Pharynx: No oropharyngeal exudate or posterior oropharyngeal erythema.  Neck:     Musculoskeletal: Neck supple. No muscular tenderness.     Thyroid: No thyromegaly.  Cardiovascular:     Rate and Rhythm: Normal rate and regular rhythm.  Pulmonary:     Effort: No respiratory distress.     Breath sounds: Normal breath sounds. No wheezing.  Abdominal:     General: Bowel sounds are normal.     Palpations: Abdomen is soft.     Tenderness: There is no abdominal tenderness.  Musculoskeletal:        General: No swelling or tenderness.  Lymphadenopathy:     Cervical: No cervical adenopathy.  Skin:    Findings: No erythema or rash.  Neurological:     Mental Status: She is alert.  Psychiatric:        Mood and Affect: Mood normal.        Behavior: Behavior normal.     BP 130/70   Pulse 69   Temp 97.8 F (36.6 C) (Oral)   Resp 16   Wt 180 lb 9.6 oz (81.9 kg)   LMP 07/16/1995 (LMP Unknown)   SpO2 96%   BMI 28.29 kg/m  Wt Readings from Last 3 Encounters:  01/06/19 180 lb 9.6 oz (81.9 kg)  09/02/18 175 lb 12.8 oz (79.7 kg)  11/19/17 174 lb (78.9 kg)     Lab Results  Component Value Date   WBC 5.0 09/02/2018   HGB 13.2 09/02/2018   HCT 38.8 09/02/2018   PLT 252.0 09/02/2018   GLUCOSE 95 09/02/2018   CHOL 230 (H)  09/02/2018   TRIG 117.0 09/02/2018   HDL 38.50 (L) 09/02/2018   LDLDIRECT 131.4 08/17/2014   LDLCALC 168 (H) 09/02/2018   ALT 15 09/02/2018   AST 17 09/02/2018   NA 138 09/02/2018   K 4.4 09/02/2018   CL 103 09/02/2018   CREATININE 0.88  09/02/2018   BUN 11 09/02/2018   CO2 27 09/02/2018   TSH 1.39 09/02/2018    Mm Screening Breast Tomo Bilateral  Result Date: 03/01/2018 CLINICAL DATA:  Screening. EXAM: DIGITAL SCREENING BILATERAL MAMMOGRAM WITH TOMO AND CAD COMPARISON:  Previous exam(s). ACR Breast Density Category b: There are scattered areas of fibroglandular density. FINDINGS: There are no findings suspicious for malignancy. Images were processed with CAD. IMPRESSION: No mammographic evidence of malignancy. A result letter of this screening mammogram will be mailed directly to the patient. RECOMMENDATION: Screening mammogram in one year. (Code:SM-B-01Y) BI-RADS CATEGORY  1: Negative. Electronically Signed   By: Lajean Manes M.D.   On: 03/01/2018 16:19       Assessment & Plan:   Problem List Items Addressed This Visit    Hypercholesterolemia    Low cholesterol diet and exercise.  Follow lipid panel.        Hyperparathyroidism, primary [facility])    S/p left parathyroidectomy- Dr Harlow Asa.  Follow calcium.       Mild depression (HCC)    Increased stress as outlined.  On effexor.  Does not feel needs any further intervention at this time.  Discussed with her today.  Plans to spend some time away.  Follow.        Sleep apnea    Has known sleep apnea.  Uses cpap regularly.  Does not feel rested when she wakes up.  Increased fatigue and daytime somnolence.  She has noticed some issues with the way her mask fits, etc.  Will refer to pulmonary for f/u of her sob, fatigue and sleep apnea.        Relevant Orders   Ambulatory referral to Pulmonology    Other Visit Diagnoses    SOB (shortness of breath)    -  Primary   Some sob as outlined.  EKG - SR with no acute ischemic changes.  No  symptoms with increased exercise.  Feels better with exercise.  See pulmonary as outlined.     Relevant Orders   EKG 12-Lead (Completed)   Ambulatory referral to Pulmonology       [name], MD

## 2019-01-08 ENCOUNTER — Encounter: Payer: Self-pay | Admitting: Internal Medicine

## 2019-01-08 NOTE — Assessment & Plan Note (Signed)
Has known sleep apnea.  Uses cpap regularly.  Does not feel rested when she wakes up.  Increased fatigue and daytime somnolence.  She has noticed some issues with the way her mask fits, etc.  Will refer to pulmonary for f/u of her sob, fatigue and sleep apnea.

## 2019-01-08 NOTE — Assessment & Plan Note (Signed)
Increased stress as outlined.  On effexor.  Does not feel needs any further intervention at this time.  Discussed with her today.  Plans to spend some time away.  Follow.

## 2019-01-08 NOTE — Assessment & Plan Note (Signed)
S/p left parathyroidectomy - Dr Gerkin.  Follow calcium.  

## 2019-01-08 NOTE — Assessment & Plan Note (Signed)
Low cholesterol diet and exercise.  Follow lipid panel.   

## 2019-01-18 ENCOUNTER — Ambulatory Visit: Payer: Self-pay

## 2019-01-18 ENCOUNTER — Ambulatory Visit: Payer: Self-pay | Admitting: Internal Medicine

## 2019-01-21 ENCOUNTER — Telehealth: Payer: Self-pay

## 2019-01-21 NOTE — Telephone Encounter (Signed)
LM for patient, we are seeing her on Monday, she currently has CPAP. What DME company does she use so we may go ahead and get download?

## 2019-01-21 NOTE — Telephone Encounter (Signed)
Spoke to patient, she states she had her sleep study at [facility]. Will contact them for sleep study results and CPAP info. Nothing further needed at this time.

## 2019-01-21 NOTE — Telephone Encounter (Signed)
Pt returned call, DME company is [facility]. Nothing further needed

## 2019-01-24 ENCOUNTER — Ambulatory Visit (INDEPENDENT_AMBULATORY_CARE_PROVIDER_SITE_OTHER): Payer: Medicare Other | Admitting: Internal Medicine

## 2019-01-24 ENCOUNTER — Other Ambulatory Visit: Payer: Self-pay

## 2019-01-24 ENCOUNTER — Encounter: Payer: Self-pay | Admitting: Internal Medicine

## 2019-01-24 VITALS — BP 110/60 | HR 63 | Ht 67.0 in | Wt 182.4 lb

## 2019-01-24 DIAGNOSIS — G4733 Obstructive sleep apnea (adult) (pediatric): Secondary | ICD-10-CM | POA: Diagnosis not present

## 2019-01-24 NOTE — Progress Notes (Signed)
[facility] Consultation      Assessment and Plan:  Obstructive sleep apnea.  --Review of download shows adequate use of CPAP with excellent controled of OSA.  --Continue CPAP.   Excessive daytime sleepiness.  --Doubt that this is related to OSA, TSH normal.  --The patients poor sleep hygiene may also be contributory. Discussed measures to improve sleep scheduled including going to bed and waking at the same time every day.  --Recommend further workup/treatment of depression if above is not helpful.    Date: 01/24/2019  MRN# [id number] Ann Osborne 1951/07/24  Referring Physician: [name] MD.   Ann Osborne is a 68 y.o. old female seen in consultation for chief complaint of:    Chief Complaint  Patient presents with  . sleep consult    currently wearing cpap avg 8hr nightly. feels pressure & mask are okay.    HPI:  The patient is a 68 year old female with a history of obstructive sleep apnea.  She presents with feeling short of breath when laying on her back.  Patient goes to bed at very variable times between 12 and 4 AM.  She usually falls asleep quickly, gets out of bed at 10 AM. She notes that she is sleeping up to 10 hours per day and still felt tired during the day. She has developed some family issues where she and her husband are under a tremendous amount of stress, and the issues started around that time.  She lays in bed for about an hour after waking because she does not want to deal with issues.  She was started on cpap about 6-7 years ago, she has replaced her machine about 1-2 yrs ago.   Denies sleep paralysis, cataplexy, no sleep walking. She cleans her supplies about every few days. She has been diagnosed with depression, she remains on effexor. Her father had Shy-Dragers, and another family member has lewy-body dementia.  **TSH 09/02/18>> 1.39.  **CPAP download 12/25/2018-01/23/2019>> average usage on days used 7 hours 35 minutes.   Usage greater than 4 hours 30/30 days.  Set pressure is 8.  Leaks are within normal limits.  Residual AHI is 3.2.  Overall this shows excellent compliance with CPAP with excellent control of obstructive sleep apnea.  PMHX:   Past Medical History:  Diagnosis Date  . ADD (attention deficit disorder)   . Allergy   . Depression   . GERD (gastroesophageal reflux disease)   . Hyperparathyroidism ([place])   . Lyme disease 6-7 YEARS   history   Surgical Hx:  Past Surgical History:  Procedure Laterality Date  . BREAST BIOPSY Left 20 yrs ago  . EYE SURGERY Bilateral 2007 OR 2008   LENS REPLACEMENT   . THYROIDECTOMY Left 11/19/2017   Procedure: LEFT INFERIOR THYROIDECTOMY;  Surgeon: Armandina Gemma, MD;  Location: [facility];  Service: General;  Laterality: Left;  . TUBAL LIGATION  1978   bilateral  . WISDOM TOOTH EXTRACTION  1980   Family Hx:  Family History  Problem Relation Age of Onset  . Uterine cancer Mother   . Heart disease Maternal Grandmother   . Alcohol abuse Maternal Grandfather   . Parkinson's disease Paternal Grandmother   . Depression Paternal Grandfather   . Breast cancer Neg Hx    Social Hx:   Social History   Tobacco Use  . Smoking status: Never Smoker  . Smokeless tobacco: Never Used  Substance Use Topics  . Alcohol use: Yes    Alcohol/week: 0.0  standard drinks    Comment: occasional  . Drug use: No   Medication:    Current Outpatient Medications:  .  Alpha-Lipoic Acid 50 MG TABS, Take by mouth daily., Disp: , Rfl:  .  Multiple Vitamins-Minerals (ICAPS AREDS 2 PO), Take by mouth 2 (two) times daily., Disp: , Rfl:  .  Multiple Vitamins-Minerals (MEGA MULTIVITAMIN FOR WOMEN PO), Take by mouth daily., Disp: , Rfl:  .  omeprazole (PRILOSEC) 20 MG capsule, Take 1 capsule (20 mg total) by mouth 2 (two) times daily before a meal., Disp: 180 capsule, Rfl: 1 .  TURMERIC PO, Take 1,000 mg by mouth daily., Disp: , Rfl:  .  venlafaxine XR (EFFEXOR-XR) 37.5 MG 24 hr capsule,  Take 1 capsule (37.5 mg total) by mouth daily., Disp: 90 capsule, Rfl: 1   Allergies:  Compazine [prochlorperazine edisylate] and Sulfa antibiotics  Review of Systems: Gen:  Denies  fever, sweats, chills HEENT: Denies blurred vision, double vision. bleeds, sore throat Cvc:  No dizziness, chest pain. Resp:   Denies cough or sputum production, shortness of breath Gi: Denies swallowing difficulty, stomach pain. Gu:  Denies bladder incontinence, burning urine Ext:   No Joint pain, stiffness. Skin: No skin rash,  hives  Endoc:  No polyuria, polydipsia. Psych: No depression, insomnia. Other:  All other systems were reviewed with the patient and were negative other that what is mentioned in the HPI.   Physical Examination:   VS: BP 110/60 (BP Location: Left Arm, Cuff Size: Normal)   Pulse 63   Ht 5\' 7"  (1.702 m)   Wt 182 lb 6.4 oz (82.7 kg)   LMP 07/16/1995 (LMP Unknown)   SpO2 96%   BMI 28.57 kg/m   General Appearance: No distress  Neuro:without focal findings,  speech normal,  HEENT: PERRLA, EOM intact.   Pulmonary: normal breath sounds, No wheezing.  CardiovascularNormal S1,S2.  No m/r/g.   Abdomen: Benign, Soft, non-tender. Renal:  No costovertebral tenderness  GU:  No performed at this time. Endoc: No evident thyromegaly, no signs of acromegaly. Skin:   warm, no rashes, no ecchymosis  Extremities: normal, no cyanosis, clubbing.  Other findings:    LABORATORY PANEL:   CBC No results for input(s): WBC, HGB, HCT, PLT in the last 168 hours. ------------------------------------------------------------------------------------------------------------------  Chemistries  No results for input(s): NA, K, CL, CO2, GLUCOSE, BUN, CREATININE, CALCIUM, MG, AST, ALT, ALKPHOS, BILITOT in the last 168 hours.  Invalid input(s): GFRCGP ------------------------------------------------------------------------------------------------------------------  Cardiac Enzymes No results for  input(s): TROPONINI in the last 168 hours. ------------------------------------------------------------  RADIOLOGY:  No results found.     Thank  you for the consultation and for allowing [facility] to assist in the care of your patient. Our recommendations are noted above.  Please contact us if we can be of further service.   Marda Stalker, M.D., F.C.C.P.  Board Certified in Internal Medicine, Pulmonary Medicine, [place], and Sleep Medicine.  [facility] Office Number: [phone]   01/24/2019

## 2019-01-24 NOTE — Patient Instructions (Signed)
Recommend that you go to bed at an earlier time.   You can follow good "sleep hygiene." That means that you: ?Sleep only long enough to feel rested and then get out of bed ?Go to bed and get up at the same time every day ?Do not try to force yourself to sleep. If you can't sleep, get out of bed and try again later. ?Have coffee, tea, and other foods that have caffeine only in the morning ?Avoid alcohol in the late afternoon, evening, and bedtime ?Avoid smoking, especially in the evening ?Keep your bedroom dark, cool, quiet, and free of reminders of work or other things that cause you stress ?Solve problems you have before you go to bed ?Exercise several days a week, but not right before bed ?Avoid looking at phones or reading devices ("e-books") that give off light before bed. This can make it harder to fall asleep. Other things that can improve sleep include: ?Relaxation therapy, in which you focus on relaxing all the muscles in your body 1 by 1 ?Working with a Engineer, materials to deal with the problems that might be causing poor sleep

## 2019-01-26 DIAGNOSIS — Z1283 Encounter for screening for malignant neoplasm of skin: Secondary | ICD-10-CM | POA: Diagnosis not present

## 2019-01-26 DIAGNOSIS — L812 Freckles: Secondary | ICD-10-CM | POA: Diagnosis not present

## 2019-01-26 DIAGNOSIS — D2261 Melanocytic nevi of right upper limb, including shoulder: Secondary | ICD-10-CM | POA: Diagnosis not present

## 2019-01-26 DIAGNOSIS — Z85828 Personal history of other malignant neoplasm of skin: Secondary | ICD-10-CM | POA: Diagnosis not present

## 2019-01-26 DIAGNOSIS — L821 Other seborrheic keratosis: Secondary | ICD-10-CM | POA: Diagnosis not present

## 2019-01-26 DIAGNOSIS — D1800 Hemangioma unspecified site: Secondary | ICD-10-CM | POA: Diagnosis not present

## 2019-01-26 DIAGNOSIS — L820 Inflamed seborrheic keratosis: Secondary | ICD-10-CM | POA: Diagnosis not present

## 2019-01-26 DIAGNOSIS — D485 Neoplasm of uncertain behavior of skin: Secondary | ICD-10-CM | POA: Diagnosis not present

## 2019-01-31 ENCOUNTER — Encounter: Payer: Self-pay | Admitting: Internal Medicine

## 2019-01-31 ENCOUNTER — Other Ambulatory Visit: Payer: Self-pay | Admitting: Internal Medicine

## 2019-01-31 DIAGNOSIS — Z1231 Encounter for screening mammogram for malignant neoplasm of breast: Secondary | ICD-10-CM

## 2019-01-31 MED ORDER — VENLAFAXINE HCL ER 75 MG PO CP24: 75.0000 mg | ORAL_CAPSULE | Freq: Every day | ORAL | 2 refills | Status: DC

## 2019-01-31 NOTE — Telephone Encounter (Signed)
rx sent in for effexor 75mg  #30 with 2 refills.

## 2019-03-11 ENCOUNTER — Telehealth: Payer: Self-pay | Admitting: Internal Medicine

## 2019-03-11 MED ORDER — VENLAFAXINE HCL ER 75 MG PO CP24: 75.0000 mg | ORAL_CAPSULE | Freq: Every day | ORAL | 0 refills | Status: DC

## 2019-03-11 NOTE — Telephone Encounter (Signed)
Copied from [place] [id number]. Topic: Quick Communication - See Telephone Encounter >> Mar 11, 2019 10:47 AM Robina Ade, Helene Kelp D wrote: CRM for notification. See Telephone encounter for: 03/11/19. Patient called and would like to let Dr. Nicki Reaper know that the medication venlafaxine XR (EFFEXOR XR) 75 MG 24 hr capsule is working for her and wants to know if she can send refill to her [place], [place].

## 2019-03-11 NOTE — Telephone Encounter (Signed)
Pt aware.

## 2019-03-11 NOTE — Telephone Encounter (Signed)
rx sent in to [facility].

## 2019-04-01 ENCOUNTER — Encounter: Payer: Self-pay | Admitting: Internal Medicine

## 2019-04-01 ENCOUNTER — Ambulatory Visit (INDEPENDENT_AMBULATORY_CARE_PROVIDER_SITE_OTHER): Payer: Medicare Other

## 2019-04-01 ENCOUNTER — Other Ambulatory Visit: Payer: Self-pay

## 2019-04-01 ENCOUNTER — Ambulatory Visit (INDEPENDENT_AMBULATORY_CARE_PROVIDER_SITE_OTHER): Payer: Medicare Other | Admitting: Internal Medicine

## 2019-04-01 DIAGNOSIS — F320 Major depressive disorder, single episode, mild: Secondary | ICD-10-CM

## 2019-04-01 DIAGNOSIS — E210 Primary hyperparathyroidism: Secondary | ICD-10-CM | POA: Diagnosis not present

## 2019-04-01 DIAGNOSIS — Z0000 Encounter for general adult medical examination without abnormal findings: Secondary | ICD-10-CM | POA: Diagnosis not present

## 2019-04-01 DIAGNOSIS — E7800 Pure hypercholesterolemia, unspecified: Secondary | ICD-10-CM

## 2019-04-01 DIAGNOSIS — G4730 Sleep apnea, unspecified: Secondary | ICD-10-CM | POA: Diagnosis not present

## 2019-04-01 DIAGNOSIS — F32A Depression, unspecified: Secondary | ICD-10-CM

## 2019-04-01 NOTE — Patient Instructions (Addendum)
  Ann Osborne , Thank you for taking time to come for your Medicare Wellness Visit. I appreciate your ongoing commitment to your health goals. Please review the following plan we discussed and let me know if I can assist you in the future.   These are the goals we discussed: Goals      Patient Stated   . Healthy Lifestyle (pt-stated)     Maintain level of  physical activity Healthy diet        This is a list of the screening recommended for you and due dates:  Health Maintenance  Topic Date Due  . Tetanus Vaccine  01/01/1968  . Mammogram  03/02/2019  . Flu Shot  06/11/2019  . Colon Cancer Screening  01/14/2025  . DEXA scan (bone density measurement)  Completed  .  Hepatitis C: One time screening is recommended by Center for Disease Control  (CDC) for  adults born from [birth year] through 1967.   Completed  . Pneumonia vaccines  Completed

## 2019-04-01 NOTE — Progress Notes (Signed)
Subjective:   Ann Osborne is a 68 y.o. female who presents for Medicare Annual (Subsequent) preventive examination.  Review of Systems:  No ROS.  Medicare Wellness Virtual Visit.  Visual/audio telehealth visit, UTA vital signs.   See social history for additional risk factors.   Cardiac Risk Factors include: advanced age (>58men, >56 women)     Objective:     Vitals: LMP 07/16/1995 (LMP Unknown)   There is no height or weight on file to calculate BMI.  Advanced Directives 04/01/2019 11/19/2017 11/09/2017 10/29/2017 10/28/2016 01/26/2016  Does Patient Have a Medical Advance Directive? Yes Yes Yes Yes Yes Yes  Type of Paramedic of [place];Living will Living will Living will Living will;Healthcare Power of [place]  Does patient want to make changes to medical advance directive? No - Patient declined - No - Patient declined No - Patient declined No - Patient declined -  Copy of [place] in Chart? Yes - validated most recent copy scanned in chart (See row information) No - copy requested No - copy requested No - copy requested No - copy requested -    Tobacco Social History   Tobacco Use  Smoking Status Never Smoker  Smokeless Tobacco Never Used     Counseling given: Not Answered   Clinical Intake:  Pre-visit preparation completed: Yes        Diabetes: No  How often do you need to have someone help you when you read instructions, pamphlets, or other written materials from your doctor or pharmacy?: 1 - Never  Interpreter Needed?: No     Past Medical History:  Diagnosis Date  . ADD (attention deficit disorder)   . Allergy   . Depression   . GERD (gastroesophageal reflux disease)   . Hyperparathyroidism ([place])   . Lyme disease 6-7 YEARS   history   Past Surgical History:  Procedure Laterality Date  . BREAST BIOPSY Left 20 yrs ago  . EYE SURGERY Bilateral  2007 OR 2008   LENS REPLACEMENT   . THYROIDECTOMY Left 11/19/2017   Procedure: LEFT INFERIOR THYROIDECTOMY;  Surgeon: Armandina Gemma, MD;  Location: [facility];  Service: General;  Laterality: Left;  . TUBAL LIGATION  1978   bilateral  . WISDOM TOOTH EXTRACTION  1980   Family History  Problem Relation Age of Onset  . Uterine cancer Mother   . Heart disease Maternal Grandmother   . Alcohol abuse Maternal Grandfather   . Parkinson's disease Paternal Grandmother   . Depression Paternal Grandfather   . Breast cancer Neg Hx    Social History   Socioeconomic History  . Marital status: Married    Spouse name: Not on file  . Number of children: 2  . Years of education: Not on file  . Highest education level: Not on file  Occupational History  . Not on file  Social Needs  . Financial resource strain: Not hard at all  . Food insecurity:    Worry: Never true    Inability: Never true  . Transportation needs:    Medical: No    Non-medical: No  Tobacco Use  . Smoking status: Never Smoker  . Smokeless tobacco: Never Used  Substance and Sexual Activity  . Alcohol use: Yes    Alcohol/week: 0.0 standard drinks    Comment: occasional  . Drug use: No  . Sexual activity: Not Currently  Lifestyle  . Physical activity:  Days per week: 2 days    Minutes per session: Not on file  . Stress: Only a little  Relationships  . Social connections:    Talks on phone: Not on file    Gets together: Not on file    Attends religious service: Not on file    Active member of club or organization: Not on file    Attends meetings of clubs or organizations: Not on file    Relationship status: Not on file  Other Topics Concern  . Not on file  Social History Narrative  . Not on file    Outpatient Encounter Medications as of 04/01/2019  Medication Sig  . Alpha-Lipoic Acid 50 MG TABS Take by mouth daily.  . Multiple Vitamins-Minerals (ICAPS AREDS 2 PO) Take by mouth 2 (two) times daily.  . Multiple  Vitamins-Minerals (MEGA MULTIVITAMIN FOR WOMEN PO) Take by mouth daily.  Marland Kitchen omeprazole (PRILOSEC) 20 MG capsule Take 1 capsule (20 mg total) by mouth 2 (two) times daily before a meal.  . TURMERIC PO Take 1,000 mg by mouth daily.  Marland Kitchen venlafaxine XR (EFFEXOR XR) 75 MG 24 hr capsule Take 1 capsule (75 mg total) by mouth daily.   No facility-administered encounter medications on file as of 04/01/2019.     Activities of Daily Living In your present state of health, do you have any difficulty performing the following activities: 04/01/2019  Hearing? Y  Comment Hearing aids  Vision? N  Difficulty concentrating or making decisions? N  Walking or climbing stairs? N  Dressing or bathing? N  Doing errands, shopping? N  Preparing Food and eating ? N  Using the Toilet? N  In the past six months, have you accidently leaked urine? N  Do you have problems with loss of bowel control? N  Managing your Medications? N  Managing your Finances? N  Housekeeping or managing your Housekeeping? N  Some recent data might be hidden    Patient Care Team: [name], MD as PCP - General (Internal Medicine)    Assessment:   This is a routine wellness examination for [place].   I connected with patient 04/01/19 at 11:30 AM EDT by a video/audio enabled telemedicine application and verified that I am speaking with the correct person using two identifiers. Patient stated full name and DOB. Patient gave permission to continue with virtual visit. Patient's location was at home and Nurse's location was at [facility] office.   Health Screenings  Mammogram - 02/2018 Colonoscopy - 01/2015 Bone Density - 12/2016 Glaucoma -none Hearing -hearing aids TSH- 08/2018 Cholesterol - 08/2018 Dental- visits every 6 month. [facility]. Vision- visits within the last 12 months.  Social  Alcohol intake - yes      Smoking history- never    Smokers in home? none Illicit drug use? none Exercise - walking every other day  1.5-2 miles, 60-90 minutes Diet - Regular; portion controlled Sexually Active -not  currently BMI- discussed the importance of a healthy diet, water intake and the benefits of aerobic exercise.  Educational material provided.   Safety  Patient feels safe at home- yes Patient does have smoke detectors at home- yes Patient does wear sunscreen or protective clothing when in direct sunlight -yes Patient does wear seat belt when in a moving vehicle -yes  Covid-19 precautions and sickness symptoms discussed.   Activities of Daily Living Patient denies needing assistance with: driving, household chores, feeding themselves, getting from bed to chair, getting to the toilet, bathing/showering, dressing,  managing money, or preparing meals.  No new identified risk were noted.    Depression Screen Patient denies losing interest in daily life, feeling hopeless, or crying easily over simple problems.   Medication-taking as directed and without issues.   Fall Screen Patient denies being afraid of falling or falling in the last year.   Memory Screen Patient is alert.  Patient denies difficulty focusing, concentrating or misplacing items. Correctly identified the president of the [place], season and recall. Patient likes to read and work puzzles for brain stimulation.  Immunizations The following Immunizations were discussed: Influenza, shingles, pneumonia, and tetanus.   Other Providers Patient Care Team: [name], MD as PCP - General (Internal Medicine)  Exercise Activities and Dietary recommendations Current Exercise Habits: Home exercise routine, Type of exercise: walking, Intensity: Mild  Goals      Patient Stated   . Healthy Lifestyle (pt-stated)     Maintain level of  physical activity Healthy diet        Fall Risk Fall Risk  04/01/2019 10/29/2017 06/23/2017 10/28/2016 10/17/2016  Falls in the past year? 0 No No No No  Comment - - - - Emmi Telephone Survey: data to  providers prior to load   Depression Screen PHQ 2/9 Scores 04/01/2019 10/29/2017 06/23/2017 10/28/2016  PHQ - 2 Score 1 0 0 0  PHQ- 9 Score - 0 3 -     Cognitive Function MMSE - Mini Mental State Exam 10/29/2017  Orientation to time 5  Orientation to Place 5  Registration 3  Attention/ Calculation 5  Recall 3  Language- name 2 objects 2  Language- repeat 1  Language- follow 3 step command 3  Language- read & follow direction 1  Write a sentence 1  Copy design 1  Total score 30     6CIT Screen 04/01/2019 10/28/2016  What Year? 0 points 0 points  What month? 0 points 0 points  What time? 0 points 0 points  Count back from 20 0 points 0 points  Months in reverse 0 points 0 points  Repeat phrase 0 points 0 points  Total Score 0 0    Immunization History  Administered Date(s) Administered  . Influenza, High Dose Seasonal PF 09/02/2018  . Influenza, Quadrivalent, Recombinant, Inj, Pf 08/19/2017  . Influenza,inj,Quad PF,6+ Mos 08/25/2013, 08/02/2014  . Influenza-Unspecified 08/20/2015, 08/19/2016  . Pneumococcal Conjugate-13 08/19/2013  . Pneumococcal Polysaccharide-23 08/19/2012, 10/29/2017   Screening Tests Health Maintenance  Topic Date Due  . TETANUS/TDAP  01/01/1968  . MAMMOGRAM  03/02/2019  . INFLUENZA VACCINE  06/11/2019  . COLONOSCOPY  01/14/2025  . DEXA SCAN  Completed  . Hepatitis C Screening  Completed  . PNA vac Low Risk Adult  Completed      Plan:    End of life planning; Advance aging; Advanced directives discussed.  Copy of current HCPOA/Living Will on file.    I have personally reviewed and noted the following in the patient's chart:   . Medical and social history . Use of alcohol, tobacco or illicit drugs  . Current medications and supplements . Functional ability and status . Nutritional status . Physical activity . Advanced directives . List of other physicians . Hospitalizations, surgeries, and ER visits in previous 12 months . Vitals .  Screenings to include cognitive, depression, and falls . Referrals and appointments  In addition, I have reviewed and discussed with patient certain preventive protocols, quality metrics, and best practice recommendations. A written personalized care plan for preventive services as  well as general preventive health recommendations were provided to patient.     Varney Biles, LPN  1/[id number]/5396   Reviewed above information.  Agree with assessment and plan.  Pt has appt with me this pm.   Dr Nicki Reaper

## 2019-04-01 NOTE — Progress Notes (Signed)
[name], female   DOB: 1950/12/05, 68 y.o.   MRN: [id number]   Virtual Visit via video Note  This visit type was conducted due to national recommendations for restrictions regarding the COVID-19 pandemic (e.g. social distancing).  This format is felt to be most appropriate for this patient at this time.  All issues noted in this document were discussed and addressed.  No physical exam was performed (except for noted visual exam findings with Video Visits).   I connected with Ann Osborne by a video enabled telemedicine application and verified that I am speaking with the correct person using two identifiers. Location patient: home Location provider: work  Persons participating in the virtual visit: patient, provider  I discussed the limitations, risks, security and privacy concerns of performing an evaluation and management service by video and the availability of in person appointments.. The patient expressed understanding and agreed to proceed.   Reason for visit: scheduled follow up.   HPI: She reports increased stress with some family issues.  Her husband has had some issues with what sounds like manic episodes.  He has seen his doctor and she is trying to get him to follow up for more treatment.  She has now separated herself from him and feels she is doing better.  75mg  effexor is work well for her.  She does not feel she needs anything more at this time.  Trying to stay active.  Walking 3x/week.  No chest pain.  No sob.  No acid reflux.  Some constipation, but she is controlling this with otc medication.  No abdominal pain.  Using cpap.  Doing well with this.     ROS: See pertinent positives and negatives per HPI.  Past Medical History:  Diagnosis Date  . ADD (attention deficit disorder)   . Allergy   . Depression   . GERD (gastroesophageal reflux disease)   . Hyperparathyroidism ([place])   . Lyme disease 6-7 YEARS   history    Past Surgical History:   Procedure Laterality Date  . BREAST BIOPSY Left 20 yrs ago  . EYE SURGERY Bilateral 2007 OR 2008   LENS REPLACEMENT   . THYROIDECTOMY Left 11/19/2017   Procedure: LEFT INFERIOR THYROIDECTOMY;  Surgeon: Armandina Gemma, MD;  Location: [facility];  Service: General;  Laterality: Left;  . TUBAL LIGATION  1978   bilateral  . WISDOM TOOTH EXTRACTION  1980    Family History  Problem Relation Age of Onset  . Uterine cancer Mother   . Heart disease Maternal Grandmother   . Alcohol abuse Maternal Grandfather   . Parkinson's disease Paternal Grandmother   . Depression Paternal Grandfather   . Breast cancer Neg Hx     SOCIAL HX: reviewed.    Current Outpatient Medications:  .  Alpha-Lipoic Acid 50 MG TABS, Take by mouth daily., Disp: , Rfl:  .  Multiple Vitamins-Minerals (ICAPS AREDS 2 PO), Take by mouth 2 (two) times daily., Disp: , Rfl:  .  Multiple Vitamins-Minerals (MEGA MULTIVITAMIN FOR WOMEN PO), Take by mouth daily., Disp: , Rfl:  .  omeprazole (PRILOSEC) 20 MG capsule, Take 1 capsule (20 mg total) by mouth 2 (two) times daily before a meal., Disp: 180 capsule, Rfl: 1 .  TURMERIC PO, Take 1,000 mg by mouth daily., Disp: , Rfl:  .  venlafaxine XR (EFFEXOR XR) 75 MG 24 hr capsule, Take 1 capsule (75 mg total) by mouth daily., Disp: 90 capsule, Rfl: 0  EXAM:  GENERAL: alert,  oriented, appears well and in no acute distress  HEENT: atraumatic, conjunttiva clear, no obvious abnormalities on inspection of external nose and ears  NECK: normal movements of the head and neck  LUNGS: on inspection no signs of respiratory distress, breathing rate appears normal, no obvious gross SOB, gasping or wheezing  CV: no obvious cyanosis  PSYCH/NEURO: pleasant and cooperative, no obvious depression or anxiety, speech and thought processing grossly intact  ASSESSMENT AND PLAN:  Discussed the following assessment and plan:  Hypercholesterolemia - Plan: Lipid panel, Comprehensive metabolic panel   Hyperparathyroidism, primary (HCC)  Mild depression (HCC)  Sleep apnea, unspecified type  Hypercholesterolemia Low cholesterol diet and exercise.  Follow lipid panel.    Hyperparathyroidism, primary [facility]) S/p left parathyroidectomy - Dr Harlow Asa.  Follow calcium.    Mild depression (HCC) Increased stress as outlined.  Discussed with her today.  On effexor 75mg  q day.  Doing well on this medication.  Does not feel needs anything more at this time.  Follow.    Sleep apnea CPAP - continue.     I discussed the assessment and treatment plan with the patient. The patient was provided an opportunity to ask questions and all were answered. The patient agreed with the plan and demonstrated an understanding of the instructions.   The patient was advised to call back or seek an in-person evaluation if the symptoms worsen or if the condition fails to improve as anticipated.  I provided 25 minutes of non-face-to-face time during this encounter.   [name], MD

## 2019-04-04 NOTE — Assessment & Plan Note (Signed)
S/p left parathyroidectomy - Dr Gerkin.  Follow calcium.  

## 2019-04-04 NOTE — Assessment & Plan Note (Signed)
CPAP continue 

## 2019-04-04 NOTE — Assessment & Plan Note (Signed)
Low cholesterol diet and exercise.  Follow lipid panel.   

## 2019-04-04 NOTE — Assessment & Plan Note (Signed)
Increased stress as outlined.  Discussed with her today.  On effexor 75mg  q day.  Doing well on this medication.  Does not feel needs anything more at this time.  Follow.

## 2019-04-06 ENCOUNTER — Other Ambulatory Visit: Payer: Self-pay | Admitting: Internal Medicine

## 2019-04-06 ENCOUNTER — Other Ambulatory Visit: Payer: Self-pay

## 2019-04-06 ENCOUNTER — Ambulatory Visit
Admission: RE | Admit: 2019-04-06 | Discharge: 2019-04-06 | Disposition: A | Discharge status: Home / Self Care | Payer: Medicare Other | Source: Ambulatory Visit | Attending: Internal Medicine | Admitting: Internal Medicine

## 2019-04-06 DIAGNOSIS — R928 Other abnormal and inconclusive findings on diagnostic imaging of breast: Secondary | ICD-10-CM

## 2019-04-06 DIAGNOSIS — Z1231 Encounter for screening mammogram for malignant neoplasm of breast: Secondary | ICD-10-CM | POA: Diagnosis not present

## 2019-04-06 DIAGNOSIS — N6320 Unspecified lump in the left breast, unspecified quadrant: Secondary | ICD-10-CM

## 2019-04-08 ENCOUNTER — Telehealth: Payer: Self-pay | Admitting: Internal Medicine

## 2019-04-08 NOTE — Telephone Encounter (Signed)
Attempted to call patient back, no answer left message that she could call back. Or we can give her a call back on Monday.

## 2019-04-08 NOTE — Telephone Encounter (Signed)
Patient returning call for mammogram results. NT currently unavailable. Would like a call back to discuss.   Copied from [place] [phone]. Topic: Quick Communication - Other Results (Clinic Use ONLY) >> Apr 08, 2019  3:48 PM Lars Masson, LPN wrote: Called pt to give mammo results. Ok for [facility] to advise

## 2019-04-14 ENCOUNTER — Ambulatory Visit: Payer: Medicare Other

## 2019-04-14 ENCOUNTER — Other Ambulatory Visit: Payer: Medicare Other

## 2019-04-21 ENCOUNTER — Ambulatory Visit
Admission: RE | Admit: 2019-04-21 | Discharge: 2019-04-21 | Disposition: A | Discharge status: Home / Self Care | Payer: Medicare Other | Source: Ambulatory Visit | Attending: Internal Medicine | Admitting: Internal Medicine

## 2019-04-21 ENCOUNTER — Other Ambulatory Visit: Payer: Self-pay | Admitting: Internal Medicine

## 2019-04-21 ENCOUNTER — Other Ambulatory Visit: Payer: Self-pay

## 2019-04-21 DIAGNOSIS — N6320 Unspecified lump in the left breast, unspecified quadrant: Secondary | ICD-10-CM

## 2019-04-21 DIAGNOSIS — N6321 Unspecified lump in the left breast, upper outer quadrant: Secondary | ICD-10-CM | POA: Diagnosis not present

## 2019-04-21 DIAGNOSIS — R928 Other abnormal and inconclusive findings on diagnostic imaging of breast: Secondary | ICD-10-CM

## 2019-04-21 DIAGNOSIS — N6489 Other specified disorders of breast: Secondary | ICD-10-CM | POA: Diagnosis not present

## 2019-04-21 NOTE — Progress Notes (Signed)
Order placed for left breast ultrasound.  

## 2019-04-21 NOTE — Progress Notes (Signed)
Opened in error

## 2019-05-02 ENCOUNTER — Other Ambulatory Visit: Payer: Self-pay | Admitting: Internal Medicine

## 2019-05-03 MED ORDER — VENLAFAXINE HCL ER 75 MG PO CP24: 75.0000 mg | ORAL_CAPSULE | Freq: Every day | ORAL | 0 refills | Status: DC

## 2019-07-20 ENCOUNTER — Encounter: Payer: Self-pay | Admitting: Internal Medicine

## 2019-07-20 ENCOUNTER — Other Ambulatory Visit: Payer: Self-pay

## 2019-07-20 ENCOUNTER — Other Ambulatory Visit (INDEPENDENT_AMBULATORY_CARE_PROVIDER_SITE_OTHER): Payer: Medicare Other

## 2019-07-20 DIAGNOSIS — E7800 Pure hypercholesterolemia, unspecified: Secondary | ICD-10-CM

## 2019-07-20 LAB — COMPREHENSIVE METABOLIC PANEL
ALT: 18 U/L (ref 0–35)
AST: 19 U/L (ref 0–37)
Albumin: 4.1 g/dL (ref 3.5–5.2)
Alkaline Phosphatase: 72 U/L (ref 39–117)
BUN: 12 mg/dL (ref 6–23)
CO2: 28 mEq/L (ref 19–32)
Calcium: 9 mg/dL (ref 8.4–10.5)
Chloride: 103 mEq/L (ref 96–112)
Creatinine, Ser: 0.83 mg/dL (ref 0.40–1.20)
GFR: 67.86 mL/min (ref >60.00)
Glucose, Bld: 88 mg/dL (ref 70–99)
Potassium: 4 mEq/L (ref 3.5–5.1)
Sodium: 139 mEq/L (ref 135–145)
Total Bilirubin: 0.5 mg/dL (ref 0.2–1.2)
Total Protein: 7.2 g/dL (ref 6.0–8.3)

## 2019-07-20 LAB — LIPID PANEL
Cholesterol: 210 mg/dL — ABNORMAL HIGH (ref 0–200)
HDL: 30.9 mg/dL — ABNORMAL LOW (ref >39.00)
NonHDL: 179.5
Total CHOL/HDL Ratio: 7
Triglycerides: 379 mg/dL — ABNORMAL HIGH (ref 0.0–149.0)
VLDL: 75.8 mg/dL — ABNORMAL HIGH (ref 0.0–40.0)

## 2019-07-20 LAB — LDL CHOLESTEROL, DIRECT: Direct LDL: 93 mg/dL

## 2019-07-22 ENCOUNTER — Other Ambulatory Visit: Payer: Self-pay

## 2019-07-22 ENCOUNTER — Ambulatory Visit (INDEPENDENT_AMBULATORY_CARE_PROVIDER_SITE_OTHER): Payer: Medicare Other | Admitting: Internal Medicine

## 2019-07-22 DIAGNOSIS — G4730 Sleep apnea, unspecified: Secondary | ICD-10-CM | POA: Diagnosis not present

## 2019-07-22 DIAGNOSIS — F320 Major depressive disorder, single episode, mild: Secondary | ICD-10-CM

## 2019-07-22 DIAGNOSIS — E210 Primary hyperparathyroidism: Secondary | ICD-10-CM | POA: Diagnosis not present

## 2019-07-22 DIAGNOSIS — R87619 Unspecified abnormal cytological findings in specimens from cervix uteri: Secondary | ICD-10-CM

## 2019-07-22 DIAGNOSIS — E7800 Pure hypercholesterolemia, unspecified: Secondary | ICD-10-CM

## 2019-07-22 DIAGNOSIS — F32A Depression, unspecified: Secondary | ICD-10-CM

## 2019-07-22 NOTE — Progress Notes (Signed)
[name], female   DOB: 11-17-1950, 68 y.o.   MRN: [id number]   Virtual Visit via video Note  This visit type was conducted due to national recommendations for restrictions regarding the COVID-19 pandemic (e.g. social distancing).  This format is felt to be most appropriate for this patient at this time.  All issues noted in this document were discussed and addressed.  No physical exam was performed (except for noted visual exam findings with Video Visits).   I connected with Ann Osborne by a video enabled telemedicine application or and verified that I am speaking with the correct person using two identifiers. Location patient: home Location provider: work Persons participating in the virtual visit: patient, provider  I discussed the limitations, risks, security and privacy concerns of performing an evaluation and management service by video and the availability of in person appointments.  The patient expressed understanding and agreed to proceed.   Reason for visit: scheduled follow up.   HPI: She reports she is doing relatively well.  Still with increased stress. On effexor.  Her and her husband are living separately.  This is working well for her.  Does request name of counselors.  Trying to stay active. States she is not walking as much.  No chest pain.  No sob.  No acid reflux.  No abdominal pain.  Bowels moving.  [place]  Discussed labs.  Increased triglycerides.  Discussed diet and exercise.     ROS: See pertinent positives and negatives per HPI.  Past Medical History:  Diagnosis Date  . ADD (attention deficit disorder)   . Allergy   . Depression   . GERD (gastroesophageal reflux disease)   . Hyperparathyroidism ([place])   . Lyme disease 6-7 YEARS   history    Past Surgical History:  Procedure Laterality Date  . BREAST BIOPSY Left 20 yrs ago  . EYE SURGERY Bilateral 2007 OR 2008   LENS REPLACEMENT   .  THYROIDECTOMY Left 11/19/2017   Procedure: LEFT INFERIOR THYROIDECTOMY;  Surgeon: Armandina Gemma, MD;  Location: [facility];  Service: General;  Laterality: Left;  . TUBAL LIGATION  1978   bilateral  . WISDOM TOOTH EXTRACTION  1980    Family History  Problem Relation Age of Onset  . Uterine cancer Mother   . Heart disease Maternal Grandmother   . Alcohol abuse Maternal Grandfather   . Parkinson's disease Paternal Grandmother   . Depression Paternal Grandfather   . Breast cancer Neg Hx     SOCIAL HX: reviewed.    Current Outpatient Medications:  .  Alpha-Lipoic Acid 50 MG TABS, Take by mouth daily., Disp: , Rfl:  .  Multiple Vitamins-Minerals (ICAPS AREDS 2 PO), Take by mouth 2 (two) times daily., Disp: , Rfl:  .  Multiple Vitamins-Minerals (MEGA MULTIVITAMIN FOR WOMEN PO), Take by mouth daily., Disp: , Rfl:  .  omeprazole (PRILOSEC) 20 MG capsule, Take 1 capsule (20 mg total) by mouth 2 (two) times daily before a meal., Disp: 180 capsule, Rfl: 1 .  TURMERIC PO, Take 1,000 mg by mouth daily., Disp: , Rfl:  .  venlafaxine XR (EFFEXOR XR) 75 MG 24 hr capsule, Take 1 capsule (75 mg total) by mouth daily., Disp: 90 capsule, Rfl: 1  EXAM:  GENERAL: alert, oriented, appears well and in no acute distress  HEENT: atraumatic, conjunttiva clear, no obvious abnormalities on inspection of external nose and ears  NECK: normal movements  of the head and neck  LUNGS: on inspection no signs of respiratory distress, breathing rate appears normal, no obvious gross SOB, gasping or wheezing  CV: no obvious cyanosis  PSYCH/NEURO: pleasant and cooperative, no obvious depression or anxiety, speech and thought processing grossly intact  ASSESSMENT AND PLAN:  Discussed the following assessment and plan:  Hypercholesterolemia Discussed recent lab results.  Discussed elevated triglycerides.  Low carb diet and exercise.  Follow lipid panel.    Hyperparathyroidism, primary [facility]) S/p left  parathyroidectomy - Dr Harlow Asa.  Follow calcium.    Mild depression ([place]) Discussed with her today.  On effexor.  Overall she feels she is handling things relatively well.  Did request names of counselors.    Sleep apnea CPAP.   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  Overdue f/u pap. Discussed with her today.  Get her back in soon to reassess.      I discussed the assessment and treatment plan with the patient. The patient was provided an opportunity to ask questions and all were answered. The patient agreed with the plan and demonstrated an understanding of the instructions.   The patient was advised to call back or seek an in-person evaluation if the symptoms worsen or if the condition fails to improve as anticipated.   [name], MD

## 2019-07-24 ENCOUNTER — Encounter: Payer: Self-pay | Admitting: Internal Medicine

## 2019-07-24 DIAGNOSIS — R87619 Unspecified abnormal cytological findings in specimens from cervix uteri: Secondary | ICD-10-CM | POA: Insufficient documentation

## 2019-07-24 MED ORDER — VENLAFAXINE HCL ER 75 MG PO CP24: 75.0000 mg | ORAL_CAPSULE | Freq: Every day | ORAL | 1 refills | Status: DC

## 2019-07-24 NOTE — Assessment & Plan Note (Signed)
Positive HPV.  Overdue f/u pap. Discussed with her today.  Get her back in soon to reassess.

## 2019-07-24 NOTE — Assessment & Plan Note (Signed)
S/p left parathyroidectomy - Dr Gerkin.  Follow calcium.  

## 2019-07-24 NOTE — Assessment & Plan Note (Signed)
Discussed with her today.  On effexor.  Overall she feels she is handling things relatively well.  Did request names of counselors.

## 2019-07-24 NOTE — Assessment & Plan Note (Signed)
CPAP.  

## 2019-07-24 NOTE — Assessment & Plan Note (Signed)
Discussed recent lab results.  Discussed elevated triglycerides.  Low carb diet and exercise.  Follow lipid panel.

## 2019-08-04 ENCOUNTER — Telehealth: Payer: Self-pay

## 2019-08-04 NOTE — Telephone Encounter (Signed)
Pt returning your call. Please call back °

## 2019-08-04 NOTE — Telephone Encounter (Signed)
LMTCB

## 2019-08-04 NOTE — Telephone Encounter (Signed)
-----   Message from [name], MD sent at 07/24/2019  8:51 PM EDT ----- Regarding: counselor names and [place] and also names of counselors.  See if she wants mailed to her or come in - if needs flu shot, etc.    Thanks.  Dr Nicki Reaper

## 2019-08-05 NOTE — Telephone Encounter (Signed)
Please give her the names of counselors.  If she is coming int tomorrow, will need to make sure the cma here tomorrow gives her the information.  She needs to be aware they will give to her.  Thanks

## 2019-08-05 NOTE — Telephone Encounter (Signed)
Info left with Ann Osborne

## 2019-08-06 ENCOUNTER — Other Ambulatory Visit: Payer: Self-pay

## 2019-08-06 ENCOUNTER — Ambulatory Visit (INDEPENDENT_AMBULATORY_CARE_PROVIDER_SITE_OTHER): Payer: Medicare Other

## 2019-08-06 DIAGNOSIS — Z23 Encounter for immunization: Secondary | ICD-10-CM

## 2019-08-18 ENCOUNTER — Other Ambulatory Visit: Payer: Self-pay | Admitting: Internal Medicine

## 2019-08-18 MED ORDER — VENLAFAXINE HCL ER 75 MG PO CP24: 75.0000 mg | ORAL_CAPSULE | Freq: Every day | ORAL | 1 refills | Status: DC

## 2019-08-18 MED ORDER — OMEPRAZOLE 20 MG PO CPDR: 20.0000 mg | DELAYED_RELEASE_CAPSULE | Freq: Two times a day (BID) | ORAL | 1 refills | Status: DC

## 2019-08-18 NOTE — Telephone Encounter (Signed)
CRM for notification. See Telephone encounter for: 08/18/19.Medication: Was sent to the wrong pharmacy - can it be sent to the pharmacy-venlafaxine XR (EFFEXOR XR) 75 MG 24 hr capsule [id number] , omeprazole (PRILOSEC) 20 MG capsule [id number]   Has the patient contacted their pharmacy? Yes  (Agent: If no, request that the patient contact the pharmacy for the refill.) (Agent: If yes, when and what did the pharmacy advise?)  Preferred Pharmacy (with phone number or street name): [facility], [place] [phone] (Phone) [phone] (Fax)    Agent: Please be advised that RX refills may take up to 3 business days. We ask that you follow-up with your pharmacy.

## 2019-08-19 DIAGNOSIS — L280 Lichen simplex chronicus: Secondary | ICD-10-CM | POA: Diagnosis not present

## 2019-08-19 DIAGNOSIS — L309 Dermatitis, unspecified: Secondary | ICD-10-CM | POA: Diagnosis not present

## 2019-10-25 ENCOUNTER — Other Ambulatory Visit: Payer: Medicare Other

## 2019-10-27 ENCOUNTER — Ambulatory Visit
Admission: RE | Admit: 2019-10-27 | Discharge: 2019-10-27 | Disposition: A | Discharge status: Home / Self Care | Payer: Medicare Other | Source: Ambulatory Visit | Attending: Internal Medicine | Admitting: Internal Medicine

## 2019-10-27 DIAGNOSIS — R928 Other abnormal and inconclusive findings on diagnostic imaging of breast: Secondary | ICD-10-CM | POA: Insufficient documentation

## 2019-10-28 ENCOUNTER — Other Ambulatory Visit: Payer: Self-pay | Admitting: Internal Medicine

## 2019-10-28 DIAGNOSIS — R928 Other abnormal and inconclusive findings on diagnostic imaging of breast: Secondary | ICD-10-CM

## 2019-10-28 NOTE — Progress Notes (Signed)
Order placed for bilateral mammogram with left breast ultrasound.

## 2019-11-22 ENCOUNTER — Encounter: Payer: Self-pay | Admitting: Internal Medicine

## 2019-11-29 DIAGNOSIS — M79671 Pain in right foot: Secondary | ICD-10-CM | POA: Diagnosis not present

## 2019-11-29 DIAGNOSIS — S93621A Sprain of tarsometatarsal ligament of right foot, initial encounter: Secondary | ICD-10-CM | POA: Diagnosis not present

## 2019-12-11 DIAGNOSIS — Z03818 Encounter for observation for suspected exposure to other biological agents ruled out: Secondary | ICD-10-CM | POA: Diagnosis not present

## 2019-12-11 DIAGNOSIS — J0110 Acute frontal sinusitis, unspecified: Secondary | ICD-10-CM | POA: Diagnosis not present

## 2019-12-20 DIAGNOSIS — S92334D Nondisplaced fracture of third metatarsal bone, right foot, subsequent encounter for fracture with routine healing: Secondary | ICD-10-CM | POA: Diagnosis not present

## 2019-12-20 DIAGNOSIS — M79671 Pain in right foot: Secondary | ICD-10-CM | POA: Diagnosis not present

## 2020-01-10 DIAGNOSIS — S92334D Nondisplaced fracture of third metatarsal bone, right foot, subsequent encounter for fracture with routine healing: Secondary | ICD-10-CM | POA: Diagnosis not present

## 2020-02-03 ENCOUNTER — Ambulatory Visit: Payer: Medicare Other | Attending: Internal Medicine

## 2020-02-03 DIAGNOSIS — Z23 Encounter for immunization: Secondary | ICD-10-CM

## 2020-02-03 NOTE — Progress Notes (Signed)
   Covid-19 Vaccination Clinic  Name:  Ann Osborne    MRN: [id number] DOB: August 04, 1951  02/03/2020  Ann Osborne was observed post Covid-19 immunization for 15 minutes without incident. She was provided with Vaccine Information Sheet and instruction to access the V-Safe system.   Ann Osborne was instructed to call 911 with any severe reactions post vaccine: Marland Kitchen Difficulty breathing  . Swelling of face and throat  . A fast heartbeat  . A bad rash all over body  . Dizziness and weakness   Immunizations Administered    Name Date Dose VIS Date Route   Moderna COVID-19 Vaccine 02/03/2020 11:33 AM 0.5 mL 10/11/2019 Intramuscular   Manufacturer: Moderna   Lot: [id number]   [id number]

## 2020-03-07 ENCOUNTER — Ambulatory Visit: Payer: Medicare Other | Attending: Internal Medicine

## 2020-03-07 DIAGNOSIS — Z23 Encounter for immunization: Secondary | ICD-10-CM

## 2020-03-07 NOTE — Progress Notes (Signed)
   Covid-19 Vaccination Clinic  Name:  Ann Osborne    MRN: [id number] DOB: 06-21-1951  03/07/2020  [name] was observed post Covid-19 immunization for 15 minutes without incident. She was provided with Vaccine Information Sheet and instruction to access the V-Safe system.   [name] was instructed to call 911 with any severe reactions post vaccine: Marland Kitchen Difficulty breathing  . Swelling of face and throat  . A fast heartbeat  . A bad rash all over body  . Dizziness and weakness   Immunizations Administered    Name Date Dose VIS Date Route   Moderna COVID-19 Vaccine 03/07/2020 12:24 PM 0.5 mL 10/2019 Intramuscular   Manufacturer: Moderna   Lot: [id number]   [id number]

## 2020-03-29 ENCOUNTER — Telehealth: Payer: Self-pay | Admitting: Internal Medicine

## 2020-03-29 NOTE — Telephone Encounter (Signed)
Patient called to be screened for 04/03/2020 appointment. Patient stated she will return to [place] from [place] on the day before and she is fully vaccinated. Can patient come into office or does this appointment need to be rescheduled 14 days out?

## 2020-03-30 NOTE — Telephone Encounter (Signed)
Patient screened and advised ok to come in

## 2020-04-03 ENCOUNTER — Other Ambulatory Visit: Payer: Self-pay

## 2020-04-03 ENCOUNTER — Ambulatory Visit (INDEPENDENT_AMBULATORY_CARE_PROVIDER_SITE_OTHER): Payer: Medicare Other

## 2020-04-03 ENCOUNTER — Encounter: Payer: Self-pay | Admitting: Internal Medicine

## 2020-04-03 ENCOUNTER — Ambulatory Visit (INDEPENDENT_AMBULATORY_CARE_PROVIDER_SITE_OTHER): Payer: Medicare Other | Admitting: Internal Medicine

## 2020-04-03 ENCOUNTER — Ambulatory Visit: Payer: Medicare Other

## 2020-04-03 ENCOUNTER — Other Ambulatory Visit (HOSPITAL_COMMUNITY)
Admission: RE | Admit: 2020-04-03 | Discharge: 2020-04-03 | Disposition: A | Discharge status: Home / Self Care | Payer: Medicare Other | Source: Ambulatory Visit | Attending: Internal Medicine | Admitting: Internal Medicine

## 2020-04-03 VITALS — Ht 67.0 in | Wt 180.0 lb

## 2020-04-03 VITALS — BP 114/62 | HR 66 | Temp 96.8°F | Ht 67.01 in | Wt 179.4 lb

## 2020-04-03 DIAGNOSIS — E7800 Pure hypercholesterolemia, unspecified: Secondary | ICD-10-CM | POA: Diagnosis not present

## 2020-04-03 DIAGNOSIS — R87619 Unspecified abnormal cytological findings in specimens from cervix uteri: Secondary | ICD-10-CM

## 2020-04-03 DIAGNOSIS — M25512 Pain in left shoulder: Secondary | ICD-10-CM | POA: Diagnosis not present

## 2020-04-03 DIAGNOSIS — Z0000 Encounter for general adult medical examination without abnormal findings: Secondary | ICD-10-CM

## 2020-04-03 DIAGNOSIS — Z124 Encounter for screening for malignant neoplasm of cervix: Secondary | ICD-10-CM | POA: Diagnosis not present

## 2020-04-03 DIAGNOSIS — R87810 Cervical high risk human papillomavirus (HPV) DNA test positive: Secondary | ICD-10-CM | POA: Diagnosis not present

## 2020-04-03 DIAGNOSIS — G4730 Sleep apnea, unspecified: Secondary | ICD-10-CM

## 2020-04-03 DIAGNOSIS — F32A Depression, unspecified: Secondary | ICD-10-CM

## 2020-04-03 DIAGNOSIS — M25511 Pain in right shoulder: Secondary | ICD-10-CM | POA: Diagnosis not present

## 2020-04-03 DIAGNOSIS — E210 Primary hyperparathyroidism: Secondary | ICD-10-CM | POA: Diagnosis not present

## 2020-04-03 DIAGNOSIS — M19012 Primary osteoarthritis, left shoulder: Secondary | ICD-10-CM | POA: Diagnosis not present

## 2020-04-03 DIAGNOSIS — F320 Major depressive disorder, single episode, mild: Secondary | ICD-10-CM

## 2020-04-03 DIAGNOSIS — Z780 Asymptomatic menopausal state: Secondary | ICD-10-CM | POA: Diagnosis not present

## 2020-04-03 LAB — COMPREHENSIVE METABOLIC PANEL
ALT: 16 U/L (ref 0–35)
AST: 21 U/L (ref 0–37)
Albumin: 4.6 g/dL (ref 3.5–5.2)
Alkaline Phosphatase: 63 U/L (ref 39–117)
BUN: 12 mg/dL (ref 6–23)
CO2: 28 mEq/L (ref 19–32)
Calcium: 9.3 mg/dL (ref 8.4–10.5)
Chloride: 104 mEq/L (ref 96–112)
Creatinine, Ser: 0.78 mg/dL (ref 0.40–1.20)
GFR: 72.75 mL/min (ref >60.00)
Glucose, Bld: 90 mg/dL (ref 70–99)
Potassium: 3.9 mEq/L (ref 3.5–5.1)
Sodium: 138 mEq/L (ref 135–145)
Total Bilirubin: 0.6 mg/dL (ref 0.2–1.2)
Total Protein: 7.5 g/dL (ref 6.0–8.3)

## 2020-04-03 LAB — LIPID PANEL
Cholesterol: 247 mg/dL — ABNORMAL HIGH (ref 0–200)
HDL: 35.4 mg/dL — ABNORMAL LOW (ref >39.00)
NonHDL: 211.37
Total CHOL/HDL Ratio: 7
Triglycerides: 205 mg/dL — ABNORMAL HIGH (ref 0.0–149.0)
VLDL: 41 mg/dL — ABNORMAL HIGH (ref 0.0–40.0)

## 2020-04-03 LAB — CBC WITH DIFFERENTIAL/PLATELET
Basophils Absolute: 0 10*3/uL (ref 0.0–0.1)
Basophils Relative: 0.6 % (ref 0.0–3.0)
Eosinophils Absolute: 0.1 10*3/uL (ref 0.0–0.7)
Eosinophils Relative: 1.3 % (ref 0.0–5.0)
HCT: 38.1 % (ref 36.0–46.0)
Hemoglobin: 13.1 g/dL (ref 12.0–15.0)
Lymphocytes Relative: 33.2 % (ref 12.0–46.0)
Lymphs Abs: 1.5 10*3/uL (ref 0.7–4.0)
MCHC: 34.3 g/dL (ref 30.0–36.0)
MCV: 91 fl (ref 78.0–100.0)
Monocytes Absolute: 0.4 10*3/uL (ref 0.1–1.0)
Monocytes Relative: 7.9 % (ref 3.0–12.0)
Neutro Abs: 2.6 10*3/uL (ref 1.4–7.7)
Neutrophils Relative %: 57 % (ref 43.0–77.0)
Platelets: 258 10*3/uL (ref 150.0–400.0)
RBC: 4.19 Mil/uL (ref 3.87–5.11)
RDW: 12.9 % (ref 11.5–15.5)
WBC: 4.5 10*3/uL (ref 4.0–10.5)

## 2020-04-03 LAB — TSH: TSH: 1.9 u[IU]/mL (ref 0.35–4.50)

## 2020-04-03 LAB — SEDIMENTATION RATE: Sed Rate: 31 mm/hr — ABNORMAL HIGH (ref 0–30)

## 2020-04-03 LAB — LDL CHOLESTEROL, DIRECT: Direct LDL: 151 mg/dL

## 2020-04-03 NOTE — Progress Notes (Signed)
[name], female   DOB: 11/11/1950, 69 y.o.   MRN: [id number]   Subjective:    [name], female    DOB: 08-19-1951, 69 y.o.   MRN: [id number]  HPI This visit occurred during the SARS-CoV-2 public health emergency.  Safety protocols were in place, including screening questions prior to the visit, additional usage of staff PPE, and extensive cleaning of exam room while observing appropriate contact time as indicated for disinfecting solutions.  Patient here for a scheduled follow up.  She reports increased stress.  Discussed with her today.  Overall doing better now.  Is separated.  Handling this well.  Reported gaining weight during covid and with increased stress.  She tries to stay active.  Has adjusted her diet.  Lost weight.  No chest pain or sob reported.  Does report bilateral shoulder pain.  Has had problems with her shoulders for a while, but pain has worsened recently with increased work outside.  Increased pain with lifting, etc.  No acid reflux reported.  No abdominal pain.  Bowels moving.  Using cpap regularly.  Evaluated by podiatry for non displaced fracture - third MT.  Is better.  Has support shoes ordered.  Is scheduled for f/u mammogram.    Past Medical History:  Diagnosis Date  . ADD (attention deficit disorder)   . Allergy   . Depression   . GERD (gastroesophageal reflux disease)   . Hyperparathyroidism ([place])   . Lyme disease 6-7 YEARS   history   Past Surgical History:  Procedure Laterality Date  . BREAST BIOPSY Left 20 yrs ago  . EYE SURGERY Bilateral 2007 OR 2008   LENS REPLACEMENT   . THYROIDECTOMY Left 11/19/2017   Procedure: LEFT INFERIOR THYROIDECTOMY;  Surgeon: Armandina Gemma, MD;  Location: [facility];  Service: General;  Laterality: Left;  . TUBAL LIGATION  1978   bilateral  . WISDOM TOOTH EXTRACTION  1980   Family History  Problem Relation Age of Onset  . Uterine cancer Mother   . Heart disease Maternal Grandmother   .  Alcohol abuse Maternal Grandfather   . Parkinson's disease Paternal Grandmother   . Depression Paternal Grandfather   . Breast cancer Neg Hx    Social History   Socioeconomic History  . Marital status: Married    Spouse name: Not on file  . Number of children: 2  . Years of education: Not on file  . Highest education level: Not on file  Occupational History  . Not on file  Tobacco Use  . Smoking status: Never Smoker  . Smokeless tobacco: Never Used  Substance and Sexual Activity  . Alcohol use: Yes    Alcohol/week: 0.0 standard drinks    Comment: occasional  . Drug use: No  . Sexual activity: Not Currently  Other Topics Concern  . Not on file  Social History Narrative  . Not on file   Social Determinants of Health   Financial Resource Strain:   . Difficulty of Paying Living Expenses:   Food Insecurity:   . Worried About Charity fundraiser in the Last Year:   . Arboriculturist in the Last Year:   Transportation Needs:   . Film/video editor (Medical):   Marland Kitchen Lack of Transportation (Non-Medical):   Physical Activity:   . Days of Exercise per Week:   . Minutes of Exercise per Session:   Stress:   . Feeling of Stress :   Social  Connections:   . Frequency of Communication with Friends and Family:   . Frequency of Social Gatherings with Friends and Family:   . Attends Religious Services:   . Active Member of Clubs or Organizations:   . Attends Archivist Meetings:   Marland Kitchen Marital Status:     Outpatient Encounter Medications as of 04/03/2020  Medication Sig  . Alpha-Lipoic Acid 50 MG TABS Take by mouth daily.  . Multiple Vitamins-Minerals (ICAPS AREDS 2 PO) Take by mouth 2 (two) times daily.  . Multiple Vitamins-Minerals (MEGA MULTIVITAMIN FOR WOMEN PO) Take by mouth daily.  Marland Kitchen omeprazole (PRILOSEC) 20 MG capsule Take 1 capsule (20 mg total) by mouth 2 (two) times daily before a meal.  . TURMERIC PO Take 1,000 mg by mouth daily.  Marland Kitchen venlafaxine XR (EFFEXOR  XR) 75 MG 24 hr capsule Take 1 capsule (75 mg total) by mouth daily.   No facility-administered encounter medications on file as of 04/03/2020.    Review of Systems  Constitutional: Negative for appetite change and unexpected weight change.       Has adjusted her diet and lost weight.   HENT: Negative for congestion and sinus pain.   Respiratory: Negative for cough, chest tightness and shortness of breath.   Cardiovascular: Negative for chest pain, palpitations and leg swelling.  Gastrointestinal: Negative for abdominal pain, diarrhea, nausea and vomiting.  Genitourinary: Negative for difficulty urinating and dysuria.  Musculoskeletal: Negative for back pain and myalgias.       Bilateral shoulder pain.    Skin: Negative for color change and rash.  Neurological: Negative for dizziness, light-headedness and headaches.  Psychiatric/Behavioral: Negative for agitation and dysphoric mood.       Objective:    Physical Exam Vitals reviewed.  Constitutional:      General: She is not in acute distress.    Appearance: Normal appearance.  HENT:     Head: Normocephalic and atraumatic.     Right Ear: External ear normal.     Left Ear: External ear normal.  Eyes:     General: No scleral icterus.       Right eye: No discharge.        Left eye: No discharge.     Conjunctiva/sclera: Conjunctivae normal.  Neck:     Thyroid: No thyromegaly.  Cardiovascular:     Rate and Rhythm: Normal rate and regular rhythm.  Pulmonary:     Effort: No respiratory distress.     Breath sounds: Normal breath sounds. No wheezing.  Abdominal:     General: Bowel sounds are normal.     Palpations: Abdomen is soft.     Tenderness: There is no abdominal tenderness.  Musculoskeletal:        General: No swelling or tenderness.     Cervical back: Neck supple. No tenderness.     Comments: Increased pain with full extension of arms.  Increased pain with abduction especially above 90 degrees.    Lymphadenopathy:      Cervical: No cervical adenopathy.  Skin:    Findings: No erythema or rash.  Neurological:     Mental Status: She is alert.  Psychiatric:        Mood and Affect: Mood normal.        Behavior: Behavior normal.     BP 114/62 (BP Location: Left Arm, Patient Position: Sitting)   Pulse 66   Temp (!) 96.8 F (36 C)   Ht 5' 7.01" (1.702 m)   Wt 179  lb 6.4 oz (81.4 kg)   LMP 07/16/1995 (LMP Unknown)   SpO2 97%   BMI 28.09 kg/m  Wt Readings from Last 3 Encounters:  04/03/20 179 lb 6.4 oz (81.4 kg)  04/03/20 180 lb (81.6 kg)  01/24/19 182 lb 6.4 oz (82.7 kg)     Lab Results  Component Value Date   WBC 4.5 04/03/2020   HGB 13.1 04/03/2020   HCT 38.1 04/03/2020   PLT 258.0 04/03/2020   GLUCOSE 90 04/03/2020   CHOL 247 (H) 04/03/2020   TRIG 205.0 (H) 04/03/2020   HDL 35.40 (L) 04/03/2020   LDLDIRECT 151.0 04/03/2020   LDLCALC 168 (H) 09/02/2018   ALT 16 04/03/2020   AST 21 04/03/2020   NA 138 04/03/2020   K 3.9 04/03/2020   CL 104 04/03/2020   CREATININE 0.78 04/03/2020   BUN 12 04/03/2020   CO2 28 04/03/2020   TSH 1.90 04/03/2020    US BREAST LTD UNI LEFT INC AXILLA  Result Date: 10/27/2019 CLINICAL DATA:  Patient for short-term follow-up probably benign left breast mass. EXAM: ULTRASOUND OF THE LEFT BREAST COMPARISON:  Previous exam(s). FINDINGS: Targeted ultrasound is performed, showing slight interval decrease in size of oval circumscribed hypoechoic mass left breast 2:30 o'clock 2 cm from nipple measuring 4 x 4 x 2 mm, previously 7 x 4 x 4 mm. IMPRESSION: Slight interval decrease in size of probable fibrocystic changes/cluster of cysts left breast 2:30 o'clock 2 cm from nipple. RECOMMENDATION: Bilateral diagnostic mammography and left breast ultrasound 04/2020 I have discussed the findings and recommendations with the patient. If applicable, a reminder letter will be sent to the patient regarding the next appointment. BI-RADS CATEGORY  3: Probably benign. Electronically  Signed   By: Lovey Newcomer M.D.   On: 10/27/2019 09:43       Assessment & Plan:   Problem List Items Addressed This Visit    Abnormal Pap smear of cervix - Primary    Previous positive HPV.  Repeat pap today.        Relevant Orders   Cytology - PAP( [place])   Bilateral shoulder pain    Persistent bilateral shoulder pain. Worsening recently.  Check esr.  Check xray.  Tylenol prn.       Relevant Orders   Sedimentation rate (Completed)   DG Shoulder Left (Completed)   DG Shoulder Right (Completed)   Health care maintenance   Hypercholesterolemia    Low cholesterol diet and exercise.  Follow lipid panel.  Has adjusted her diet and lost weight.  Follow lipid panel.        Relevant Orders   CBC with Differential/Platelet (Completed)   Comprehensive metabolic panel (Completed)   Lipid panel (Completed)   TSH (Completed)   Hyperparathyroidism, primary ([facility])    S/p left parathyroidectomy - Dr Harlow Asa.  Recheck calcium.       Mild depression ([place])    On effexor.  Recently separated.  Overall doing better.  Handling things well.  Feels better.  Follow.        Sleep apnea    Continue cpap.           [name], MD

## 2020-04-03 NOTE — Progress Notes (Addendum)
Subjective:   Ann Osborne is a 69 y.o. female who presents for Medicare Annual (Subsequent) preventive examination.  Review of Systems:  No ROS.  Medicare Wellness Virtual Visit.  Visual/audio telehealth visit, UTA vital signs.   Wt/Ht provided.  See social history for additional risk factors.   Cardiac Risk Factors include: advanced age (>58men, >58 women)     Objective:     Vitals: Ht 5\' 7"  (1.702 m)   Wt 180 lb (81.6 kg)   LMP 07/16/1995 (LMP Unknown)   BMI 28.19 kg/m   Body mass index is 28.19 kg/m.  Advanced Directives 04/03/2020 04/01/2019 11/19/2017 11/09/2017 10/29/2017 10/28/2016 01/26/2016  Does Patient Have a Medical Advance Directive? Yes Yes Yes Yes Yes Yes Yes  Type of Paramedic of [place];Living will [place];Living will Living will Living will Living will;Healthcare Power of [place]  Does patient want to make changes to medical advance directive? No - Patient declined No - Patient declined - No - Patient declined No - Patient declined No - Patient declined -  Copy of [place] in Chart? Yes - validated most recent copy scanned in chart (See row information) Yes - validated most recent copy scanned in chart (See row information) No - copy requested No - copy requested No - copy requested No - copy requested -    Tobacco Social History   Tobacco Use  Smoking Status Never Smoker  Smokeless Tobacco Never Used     Counseling given: Not Answered   Clinical Intake:  Pre-visit preparation completed: Yes        Diabetes: No  How often do you need to have someone help you when you read instructions, pamphlets, or other written materials from your doctor or pharmacy?: 1 - Never    Interpreter Needed?: No     Past Medical History:  Diagnosis Date  . ADD (attention deficit disorder)   . Allergy   . Depression   . GERD  (gastroesophageal reflux disease)   . Hyperparathyroidism ([place])   . Lyme disease 6-7 YEARS   history   Past Surgical History:  Procedure Laterality Date  . BREAST BIOPSY Left 20 yrs ago  . EYE SURGERY Bilateral 2007 OR 2008   LENS REPLACEMENT   . THYROIDECTOMY Left 11/19/2017   Procedure: LEFT INFERIOR THYROIDECTOMY;  Surgeon: Armandina Gemma, MD;  Location: [facility];  Service: General;  Laterality: Left;  . TUBAL LIGATION  1978   bilateral  . WISDOM TOOTH EXTRACTION  1980   Family History  Problem Relation Age of Onset  . Uterine cancer Mother   . Heart disease Maternal Grandmother   . Alcohol abuse Maternal Grandfather   . Parkinson's disease Paternal Grandmother   . Depression Paternal Grandfather   . Breast cancer Neg Hx    Social History   Socioeconomic History  . Marital status: Married    Spouse name: Not on file  . Number of children: 2  . Years of education: Not on file  . Highest education level: Not on file  Occupational History  . Not on file  Tobacco Use  . Smoking status: Never Smoker  . Smokeless tobacco: Never Used  Substance and Sexual Activity  . Alcohol use: Yes    Alcohol/week: 0.0 standard drinks    Comment: occasional  . Drug use: No  . Sexual activity: Not Currently  Other Topics Concern  . Not on file  Social History Narrative  . Not on file   Social Determinants of Health   Financial Resource Strain:   . Difficulty of Paying Living Expenses:   Food Insecurity:   . Worried About Charity fundraiser in the Last Year:   . Arboriculturist in the Last Year:   Transportation Needs:   . Film/video editor (Medical):   Marland Kitchen Lack of Transportation (Non-Medical):   Physical Activity:   . Days of Exercise per Week:   . Minutes of Exercise per Session:   Stress:   . Feeling of Stress :   Social Connections:   . Frequency of Communication with Friends and Family:   . Frequency of Social Gatherings with Friends and Family:   . Attends Religious  Services:   . Active Member of Clubs or Organizations:   . Attends Archivist Meetings:   Marland Kitchen Marital Status:     Outpatient Encounter Medications as of 04/03/2020  Medication Sig  . Alpha-Lipoic Acid 50 MG TABS Take by mouth daily.  . Multiple Vitamins-Minerals (ICAPS AREDS 2 PO) Take by mouth 2 (two) times daily.  . Multiple Vitamins-Minerals (MEGA MULTIVITAMIN FOR WOMEN PO) Take by mouth daily.  Marland Kitchen omeprazole (PRILOSEC) 20 MG capsule Take 1 capsule (20 mg total) by mouth 2 (two) times daily before a meal.  . TURMERIC PO Take 1,000 mg by mouth daily.  Marland Kitchen venlafaxine XR (EFFEXOR XR) 75 MG 24 hr capsule Take 1 capsule (75 mg total) by mouth daily.   No facility-administered encounter medications on file as of 04/03/2020.    Activities of Daily Living In your present state of health, do you have any difficulty performing the following activities: 04/03/2020  Hearing? Y  Comment Hearing aids  Vision? N  Difficulty concentrating or making decisions? N  Walking or climbing stairs? N  Dressing or bathing? N  Doing errands, shopping? N  Preparing Food and eating ? N  Using the Toilet? N  In the past six months, have you accidently leaked urine? N  Do you have problems with loss of bowel control? N  Managing your Medications? N  Managing your Finances? N  Housekeeping or managing your Housekeeping? N  Some recent data might be hidden    Patient Care Team: [name], MD as PCP - General (Internal Medicine)    Assessment:   This is a routine wellness examination for Ann Osborne.  I connected with Ann Osborne today by telephone and verified that I am speaking with the correct person using two identifiers. Location patient: home Location provider: work Persons participating in the virtual visit: patient, provider.   I discussed the limitations, risks, security and privacy concerns of performing an evaluation and management service by telephone and the availability of in  person appointments. I also discussed with the patient that there may be a patient responsible charge related to this service. The patient expressed understanding and verbally consented to this telephonic visit.    Interactive audio and video telecommunications were attempted between this provider and patient, however failed, due to patient having technical difficulties OR patient did not have access to video capability.  We continued and completed visit with audio only.  Some vital signs may be absent or patient reported.   Time Spent with patient on telephone encounter: 30 minutes  Exercise Activities and Dietary recommendations Current Exercise Habits: Home exercise routine, Type of exercise: walking, Frequency (Times/Week): 2, Intensity: Mild  Goals Addressed  This Visit's Progress     Patient Stated   . I would like to lose about 10lbs (pt-stated)       Weight desired 165lb-170lb       Fall Risk: Fall Risk  04/03/2020 04/01/2019 04/01/2019 10/29/2017 06/23/2017  Falls in the past year? 0 0 0 No No  Comment - - - - -  Number falls in past yr: - 0 - - -  Injury with Fall? - 0 - - -  Follow up Falls evaluation completed Falls evaluation completed - - -    FALL RISK PREVENTION:  Handrails in use when climbing stairs- Yes  Home free of loose throw rugs in walkways, pet beds, electrical cords, etc? Yes Adequate lighting in your home to reduce risk of falls? Yes   TIMED UP AND GO:  Was the test performed? No, virtual visit   Depression Screen PHQ 2/9 Scores 04/03/2020 04/01/2019 04/01/2019 10/29/2017  PHQ - 2 Score 0 0 1 0  PHQ- 9 Score - 0 - 0     Cognitive Function MMSE - Mini Mental State Exam 10/29/2017  Orientation to time 5  Orientation to Place 5  Registration 3  Attention/ Calculation 5  Recall 3  Language- name 2 objects 2  Language- repeat 1  Language- follow 3 step command 3  Language- read & follow direction 1  Write a sentence 1  Copy  design 1  Total score 30     6CIT Screen 04/01/2019 10/28/2016  What Year? 0 points 0 points  What month? 0 points 0 points  What time? 0 points 0 points  Count back from 20 0 points 0 points  Months in reverse 0 points 0 points  Repeat phrase 0 points 0 points  Total Score 0 0   MMSE: Declined. Patient is alert and oriented x3.  Immunization History  Administered Date(s) Administered  . Fluad Quad(high Dose 65+) 08/06/2019  . Influenza, High Dose Seasonal PF 09/02/2018  . Influenza, Quadrivalent, Recombinant, Inj, Pf 08/19/2017  . Influenza,inj,Quad PF,6+ Mos 08/25/2013, 08/02/2014  . Influenza-Unspecified 08/20/2015, 08/19/2016  . Moderna SARS-COVID-2 Vaccination 02/03/2020, 03/07/2020  . Pneumococcal Conjugate-13 08/19/2013  . Pneumococcal Polysaccharide-23 08/19/2012, 10/29/2017   Tdap and Shingles Vaccine: Education has been provided regarding the importance of this vaccine. Advised may receive this vaccine at doctor visit, local pharmacy or Health Dept. Aware to provide a copy of the vaccination record if obtained from local pharmacy or Health Dept. Verbalized acceptance and understanding.  Covid-19 Vaccine: Completed vaccines.  Screening Tests Health Maintenance  Topic Date Due  . TETANUS/TDAP  Never done  . MAMMOGRAM  04/05/2020  . INFLUENZA VACCINE  06/10/2020  . COLONOSCOPY  01/14/2025  . DEXA SCAN  Completed  . COVID-19 Vaccine  Completed  . Hepatitis C Screening  Completed  . PNA vac Low Risk Adult  Completed    Cancer Screenings:  Colorectal Screening: Completed 01/15/15. Repeat every 10 years.   Mammogram: Completed 04/06/19. Repeat every 1 year. Scheduled 05/09/20.   Bone Density: Completed 12/30/16.   Lung Cancer Screening: (Low Dose CT Chest recommended if Age 13-80 years, 30 pack-year currently smoking OR have quit w/in 15years.) Does not qualify.   Additional Screening:  Hepatitis C Screening:  Completed 10/12/15.  Dental Screening: Recommended  annual dental exams for proper oral hygiene.  Community Resource Referral:  CRR required this visit?  No.     Plan:  I have personally reviewed and addressed the Medicare Annual Wellness questionnaire and have  noted the following in the patient's chart:  A. Medical and social history B. Use of alcohol, tobacco or illicit drugs  C. Current medications and supplements D. Functional ability and status E.  Nutritional status F.  Physical activity G. Advance directives H. List of other physicians I.  Hospitalizations, surgeries, and ER visits in previous 12 months J.  [place] such as hearing and vision if needed, cognitive and depression L. Referrals and appointments   I have reviewed and discussed with patient certain preventive protocols, quality metrics, and best practice recommendations.   Carie Caddy, LPN  [id number] Nurse Health Advisor   Nurse Notes:   Keep all routine maintenance appointments.   Follow up with your doctor today @ 11:00. Request Tdap Rx and discuss shoulder pain, bilateral.    Reviewed above information.  Agree with assessment and plan.  Will get Tdap at pharmacy.  See note for f/u regarding shoulder pain.   Dr Nicki Reaper

## 2020-04-03 NOTE — Patient Instructions (Addendum)
  Ann Osborne , Thank you for taking time to come for your Medicare Wellness Visit. I appreciate your ongoing commitment to your health goals. Please review the following plan we discussed and let me know if I can assist you in the future.   These are the goals we discussed: Goals      Patient Stated   . I would like to lose about 10lbs (pt-stated)     Weight desired 165lb-170lb       This is a list of the screening recommended for you and due dates:  Health Maintenance  Topic Date Due  . Tetanus Vaccine  Never done  . Mammogram  04/05/2020  . Flu Shot  06/10/2020  . Colon Cancer Screening  01/14/2025  . DEXA scan (bone density measurement)  Completed  . COVID-19 Vaccine  Completed  .  Hepatitis C: One time screening is recommended by Center for Disease Control  (CDC) for  adults born from [birth year] through 1967.   Completed  . Pneumonia vaccines  Completed

## 2020-04-04 ENCOUNTER — Encounter: Payer: Self-pay | Admitting: Internal Medicine

## 2020-04-04 DIAGNOSIS — M25511 Pain in right shoulder: Secondary | ICD-10-CM

## 2020-04-04 NOTE — Assessment & Plan Note (Signed)
On effexor.  Recently separated.  Overall doing better.  Handling things well.  Feels better.  Follow.

## 2020-04-04 NOTE — Assessment & Plan Note (Signed)
Continue cpap.  

## 2020-04-04 NOTE — Assessment & Plan Note (Signed)
S/p left parathyroidectomy - Dr Harlow Asa.  Recheck calcium.

## 2020-04-04 NOTE — Telephone Encounter (Signed)
Order placed for physical therapy referral.

## 2020-04-04 NOTE — Assessment & Plan Note (Signed)
Previous positive HPV.  Repeat pap today.

## 2020-04-04 NOTE — Assessment & Plan Note (Signed)
Low cholesterol diet and exercise.  Follow lipid panel.  Has adjusted her diet and lost weight.  Follow lipid panel.

## 2020-04-04 NOTE — Assessment & Plan Note (Signed)
Persistent bilateral shoulder pain. Worsening recently.  Check esr.  Check xray.  Tylenol prn.

## 2020-04-05 ENCOUNTER — Other Ambulatory Visit: Payer: Self-pay

## 2020-04-05 ENCOUNTER — Encounter: Payer: Self-pay | Admitting: Internal Medicine

## 2020-04-05 LAB — CYTOLOGY - PAP
Comment: NEGATIVE
Diagnosis: NEGATIVE
High risk HPV: POSITIVE — AB

## 2020-04-05 MED ORDER — OMEPRAZOLE 20 MG PO CPDR: 20.0000 mg | DELAYED_RELEASE_CAPSULE | Freq: Two times a day (BID) | ORAL | 1 refills | Status: DC

## 2020-04-05 MED ORDER — VENLAFAXINE HCL ER 75 MG PO CP24: 75.0000 mg | ORAL_CAPSULE | Freq: Every day | ORAL | 1 refills | Status: DC

## 2020-04-07 ENCOUNTER — Other Ambulatory Visit: Payer: Self-pay | Admitting: Internal Medicine

## 2020-04-07 DIAGNOSIS — R87810 Cervical high risk human papillomavirus (HPV) DNA test positive: Secondary | ICD-10-CM

## 2020-04-07 NOTE — Progress Notes (Signed)
Order placed for gyn referral.  

## 2020-04-24 ENCOUNTER — Other Ambulatory Visit: Payer: Medicare Other

## 2020-05-09 ENCOUNTER — Other Ambulatory Visit: Payer: Medicare Other

## 2020-05-10 DIAGNOSIS — M2021 Hallux rigidus, right foot: Secondary | ICD-10-CM | POA: Diagnosis not present

## 2020-05-10 DIAGNOSIS — S92334D Nondisplaced fracture of third metatarsal bone, right foot, subsequent encounter for fracture with routine healing: Secondary | ICD-10-CM | POA: Diagnosis not present

## 2020-05-16 ENCOUNTER — Ambulatory Visit: Payer: Medicare Other | Attending: Internal Medicine

## 2020-05-16 ENCOUNTER — Other Ambulatory Visit: Payer: Self-pay

## 2020-05-16 DIAGNOSIS — M25511 Pain in right shoulder: Secondary | ICD-10-CM | POA: Insufficient documentation

## 2020-05-16 DIAGNOSIS — M25611 Stiffness of right shoulder, not elsewhere classified: Secondary | ICD-10-CM | POA: Diagnosis not present

## 2020-05-16 DIAGNOSIS — M25512 Pain in left shoulder: Secondary | ICD-10-CM | POA: Diagnosis not present

## 2020-05-16 DIAGNOSIS — M25612 Stiffness of left shoulder, not elsewhere classified: Secondary | ICD-10-CM | POA: Insufficient documentation

## 2020-05-16 NOTE — Therapy (Signed)
[facility] [address], [place], [address] Phone: [phone]   Fax:  [phone]  Physical Therapy Evaluation  Patient Details  Name: Ann Osborne MRN: [id number] Date of Birth: Dec 26, 1950 Referring Provider (PT): [name], MD    Encounter Date: 05/16/2020   PT End of Session - 05/16/20 1458    Visit Number 1    Number of Visits 13    Date for PT Re-Evaluation 06/27/20    Authorization Type 1/10    PT Start Time 1430    PT Stop Time 1530    PT Time Calculation (min) 60 min    Activity Tolerance Patient tolerated treatment well;No increased pain    Behavior During Therapy WFL for tasks assessed/performed           Past Medical History:  Diagnosis Date   ADD (attention deficit disorder)    Allergy    Depression    GERD (gastroesophageal reflux disease)    Hyperparathyroidism ([place])    Lyme disease 6-7 YEARS   history    Past Surgical History:  Procedure Laterality Date   BREAST BIOPSY Left 20 yrs ago   EYE SURGERY Bilateral 2007 OR 2008   LENS REPLACEMENT    THYROIDECTOMY Left 11/19/2017   Procedure: LEFT INFERIOR THYROIDECTOMY;  Surgeon: Armandina Gemma, MD;  Location: [facility];  Service: General;  Laterality: Left;   TUBAL LIGATION  1978   bilateral   [place]    There were no vitals filed for this visit.    Subjective Assessment - 05/16/20 1448    Subjective Patient is a 69 y/o female with a c/c of bilateral shoulder pain with an insidious onset after performing yard work after being inactive during [place].  Patient has a history of frozen shoulder for which she received physical therapy for and had a good outcome.  Patient feels debilitated due to the fact that she can not be as active as she use to be before becoming inactive due to the pandemic. Patient reports morning stiffness thats relieved after getting up and moving around.  Patient states that  her worst pain is 8/10 NPS when moving a lot and that the best her pain is 0/10 NPS when she is not moving a lot. Patient is now becoming more active through yard work and household chores which is causing the her the most pain.Marland Kitchen    Pertinent History High Cholesterol    Limitations Lifting    Patient Stated Goals Patients goal is to be able to be active with less shoulder pain.    Currently in Pain? No/denies    Pain Score 0-No pain              [facility] PT Assessment - 05/16/20 0001      Assessment   Medical Diagnosis Bilateral Shoulder Pain     Referring Provider (PT) [name], MD     Onset Date/Surgical Date 12/12/19    Hand Dominance Right    Prior Therapy yes       [place] residence    Living Arrangements Alone    Type of Home House      Prior Function   Level of Independence Independent    Vocation Retired      ROM / Strength   AROM / PROM / Strength AROM;Strength;PROM      AROM   AROM Assessment Site Shoulder  Right/Left Shoulder Right;Left    Right Shoulder Extension --   WFL   Right Shoulder Flexion 110 Degrees    Right Shoulder ABduction 110 Degrees    Right Shoulder Internal Rotation --   WFL   Right Shoulder External Rotation --   WFL   Left Shoulder Extension --   WFL   Left Shoulder Flexion 120 Degrees    Left Shoulder ABduction 110 Degrees    Left Shoulder Internal Rotation --   WFL   Left Shoulder External Rotation --   WFL     PROM   PROM Assessment Site Shoulder    Right/Left Shoulder Right;Left    Right Shoulder Flexion 155 Degrees    Left Shoulder Flexion 125 Degrees   continued to guard during PROM      Strength   Strength Assessment Site Shoulder    Right/Left Shoulder Right;Left    Right Shoulder Flexion 4/5    Right Shoulder Extension 4-/5    Right Shoulder ABduction 4-/5    Left Shoulder Flexion 4-/5    Left Shoulder Extension 4-/5    Left Shoulder ABduction 4-/5          Therapeutic  Exercise  HEP -Bilat Shoulder Wall Slides x10 -Scapular retractions x10 -Supine Hands Clasped Shoulder Flexion AAROM  x10     Objective measurements completed on examination: See above findings.       PT Education - 05/16/20 1455    Education Details Patient educated on aging and pain    Person(s) Educated Patient    Methods Explanation;Demonstration    Comprehension Verbalized understanding;Returned demonstration            PT Short Term Goals - 05/16/20 1634      PT SHORT TERM GOAL #1   Title Patient's worst pain will decrease to 5/10NPS after working in the yard for 1 hour.    Baseline 8/10 NPS    Time 3    Period Weeks    Status New    Target Date 06/06/20             PT Long Term Goals - 05/16/20 1636      PT LONG TERM GOAL #1   Title Patients worst pain will decrease to 1/10NPS after working in the yard for 2 or more hours.    Baseline 8/10NPS    Time 6    Period Weeks    Status New    Target Date 06/27/20      PT LONG TERM GOAL #2   Title Patients will increase FOTO score to 67 to show an improvement in functional activites.    Baseline 54    Time 6    Period Weeks    Status New    Target Date 06/27/20      PT LONG TERM GOAL #3   Title Patient will be able to complete household tasks without an increase in pain to show improvement in tasks independence.    Time 6    Period Weeks    Status New    Target Date 06/27/20                  Plan - 05/16/20 1522    Clinical Impression Statement Patient is a 69 y/o female presenting with bilateral shoulder pain with an insidious onset this past February due to becoming active after being inactive during the pandemic. Patients ROM in both shoulders are limited during AROM; PROM shows an increase in ROM  without pain indicating that the limited AROM is muscular in nature and not related to a joint issue. Patients insidious onset of bilateral shoulder pain is likely due to the dramatic increase in  activity after such a long period of time being inactive. Patient demonstrates muscular weakness in both shoulders and will benefit from skilled therapy to return to PLOF and meet personal goals.    Personal Factors and Comorbidities Comorbidity 1;Age;Fitness    Comorbidities High [place]ol[place]    Examination-Activity Limitations Reach Overhead;Lift    Examination-Participation Restrictions Yard Work;Cleaning    Stability/Clinical Decision Making Stable/Uncomplicated    Clinical Decision Making Low    Rehab Potential Good    PT Frequency 2x / week    PT Duration 6 weeks    PT Treatment/Interventions ADLs/Self Care Home Management;Electrical Stimulation;Moist Heat;Ultrasound;Traction;Gait training;Functional mobility training;Therapeutic activities;Therapeutic exercise;Balance training;Neuromuscular re-education;Patient/family education;Manual techniques;Passive range of motion;Dry needling;Energy conservation;Spinal Manipulations;Joint Manipulations;Stair training    PT Next Visit Plan Review HEP and begin strengthening of shoulder complex    PT Home Exercise Plan AAROM supine hands clasped shoulder flexion, Scapular retraction, and wall slide s    Consulted and Agree with Plan of Care Patient           Patient will benefit from skilled therapeutic intervention in order to improve the following deficits and impairments:  Decreased balance, Decreased activity tolerance, Decreased endurance, Decreased range of motion, Decreased strength, Impaired UE functional use, Pain, Impaired flexibility, Decreased mobility  Visit Diagnosis: Acute pain of right shoulder  Acute pain of left shoulder  Stiffness of right shoulder, not elsewhere classified  Stiffness of left shoulder, not elsewhere classified     Problem List Patient Active Problem List   Diagnosis Date Noted   Bilateral shoulder pain 04/03/2020   Abnormal Pap smear of cervix 07/24/2019   Cough 09/05/2018   Low serum vitamin B12  09/02/2018   Hyperparathyroidism, primary ([place]) 11/18/2017   Memory change 10/29/2017   Dysphagia 01/19/2017   Health care maintenance 03/14/2015   Constipation 09/17/2014   Right leg numbness 08/02/2014   Toenail fungus 01/30/2014   Hypercholesterolemia 01/30/2014   Rash 01/30/2014   Breast tenderness 07/25/2013   Sleep apnea 12/15/2012   Mild depression ([place]) 12/15/2012   Cerebellar lesion 12/15/2012   Hematuria 12/15/2012   4:44 PM, 05/16/20 Margarito Liner, SPT Student Physical Therapist [place]  Margarito Liner 05/16/2020, 4:41 PM  [facility] [address] [place], [address] Phone: [phone]   Fax:  [phone]  Name: Ann Osborne MRN: [id number] Date of Birth: Jun 01, 1951

## 2020-05-17 ENCOUNTER — Ambulatory Visit
Admission: RE | Admit: 2020-05-17 | Discharge: 2020-05-17 | Disposition: A | Discharge status: Home / Self Care | Payer: Medicare Other | Source: Ambulatory Visit | Attending: Internal Medicine | Admitting: Internal Medicine

## 2020-05-17 DIAGNOSIS — R928 Other abnormal and inconclusive findings on diagnostic imaging of breast: Secondary | ICD-10-CM

## 2020-05-21 ENCOUNTER — Other Ambulatory Visit: Payer: Self-pay

## 2020-05-21 ENCOUNTER — Ambulatory Visit: Payer: Medicare Other

## 2020-05-21 DIAGNOSIS — M25612 Stiffness of left shoulder, not elsewhere classified: Secondary | ICD-10-CM

## 2020-05-21 DIAGNOSIS — M25512 Pain in left shoulder: Secondary | ICD-10-CM | POA: Diagnosis not present

## 2020-05-21 DIAGNOSIS — M25511 Pain in right shoulder: Secondary | ICD-10-CM | POA: Diagnosis not present

## 2020-05-21 DIAGNOSIS — M25611 Stiffness of right shoulder, not elsewhere classified: Secondary | ICD-10-CM | POA: Diagnosis not present

## 2020-05-21 NOTE — Therapy (Signed)
[facility] [address] [place], [address] Phone: [phone]   Fax:  [phone]  Physical Therapy Treatment  Patient Details  Name: Ann Osborne MRN: [id number] Date of Birth: 1951/10/25 Referring Provider (PT): [name], MD    Encounter Date: 05/21/2020   PT End of Session - 05/21/20 1515    Visit Number 2    Number of Visits 13    Date for PT Re-Evaluation 06/27/20    Authorization Type 2/10    PT Start Time 1515    PT Stop Time 1600    PT Time Calculation (min) 45 min    Activity Tolerance Patient tolerated treatment well;No increased pain    Behavior During Therapy WFL for tasks assessed/performed           Past Medical History:  Diagnosis Date  . ADD (attention deficit disorder)   . Allergy   . Depression   . GERD (gastroesophageal reflux disease)   . Hyperparathyroidism ([place])   . Lyme disease 6-7 YEARS   history    Past Surgical History:  Procedure Laterality Date  . BREAST BIOPSY Left 20 yrs ago  . EYE SURGERY Bilateral 2007 OR 2008   LENS REPLACEMENT   . THYROIDECTOMY Left 11/19/2017   Procedure: LEFT INFERIOR THYROIDECTOMY;  Surgeon: Armandina Gemma, MD;  Location: [facility];  Service: General;  Laterality: Left;  . TUBAL LIGATION  1978   bilateral  . WISDOM TOOTH EXTRACTION  1980    There were no vitals filed for this visit.   Subjective Assessment - 05/21/20 1512    Subjective Patient reports she has aches in her shoulder but are better than last session.    Pertinent History High Cholesterol    Limitations Lifting    Patient Stated Goals Patients goal is to be able to be active with less shoulder pain.    Currently in Pain? No/denies          INTERVENTIONS    Therapeutic Exercise   -Bilat Shoulder Wall Slides x10 -Shoulder Flexion 2lbs 2x10 -Shoulder Abduction 2lbs x10 -Straight Arm Pulldown 2x15 -RTB GHJ ER 2x15  -RTB Straight Arm Pull away 2x10 -RTB  Rows 2x15 -Scapular Retractions x10 -Supine Hands Clasped Shoulder Flexion AAROM x10  Performed exercises to improve shoulder strength/stability and ROM      PT Education - 05/21/20 1514    Education Details form/technique for exercises    Person(s) Educated Patient    Methods Explanation;Demonstration    Comprehension Verbalized understanding;Returned demonstration            PT Short Term Goals - 05/16/20 1634      PT SHORT TERM GOAL #1   Title Patient's worst pain will decrease to 5/10NPS after working in the yard for 1 hour.    Baseline 8/10 NPS    Time 3    Period Weeks    Status New    Target Date 06/06/20             PT Long Term Goals - 05/16/20 1636      PT LONG TERM GOAL #1   Title Patients worst pain will decrease to 1/10NPS after working in the yard for 2 or more hours.    Baseline 8/10NPS    Time 6    Period Weeks    Status New    Target Date 06/27/20      PT LONG TERM GOAL #2   Title Patients will  increase FOTO score to 67 to show an improvement in functional activites.    Baseline 54    Time 6    Period Weeks    Status New    Target Date 06/27/20      PT LONG TERM GOAL #3   Title Patient will be able to complete household tasks without an increase in pain to show improvement in tasks independence.    Time 6    Period Weeks    Status New    Target Date 06/27/20                 Plan - 05/21/20 1529    Clinical Impression Statement Patient tolerated todays exercises well without shoulder pain. Patient required verbal and tactile cues to perform exercises correctly. Patient demonstrates weakness in bilat shoulders and fatigued easily during shoulder exercises. Patient will continue to benefit from skilled PT to progress towards goals and return to PLOF.    Personal Factors and Comorbidities Comorbidity 1;Age;Fitness    Comorbidities High Cholestorl    Examination-Activity Limitations Reach Overhead;Lift    Examination-Participation  Restrictions Yard Work;Cleaning    Stability/Clinical Decision Making Stable/Uncomplicated    Clinical Decision Making Low    Rehab Potential Good    PT Frequency 2x / week    PT Duration 6 weeks    PT Treatment/Interventions ADLs/Self Care Home Management;Electrical Stimulation;Moist Heat;Ultrasound;Traction;Gait training;Functional mobility training;Therapeutic activities;Therapeutic exercise;Balance training;Neuromuscular re-education;Patient/family education;Manual techniques;Passive range of motion;Dry needling;Energy conservation;Spinal Manipulations;Joint Manipulations;Stair training    PT Next Visit Plan Review HEP and begin strengthening of shoulder complex    PT Home Exercise Plan AAROM supine hands clasped shoulder flexion, Scapular retraction, and wall slide s    Consulted and Agree with Plan of Care Patient           Patient will benefit from skilled therapeutic intervention in order to improve the following deficits and impairments:  Decreased balance, Decreased activity tolerance, Decreased endurance, Decreased range of motion, Decreased strength, Impaired UE functional use, Pain, Impaired flexibility, Decreased mobility  Visit Diagnosis: Acute pain of right shoulder  Acute pain of left shoulder  Stiffness of right shoulder, not elsewhere classified  Stiffness of left shoulder, not elsewhere classified     Problem List Patient Active Problem List   Diagnosis Date Noted  . Bilateral shoulder pain 04/03/2020  . Abnormal Pap smear of cervix 07/24/2019  . Cough 09/05/2018  . Low serum vitamin B12 09/02/2018  . Hyperparathyroidism, primary ([place]) 11/18/2017  . Memory change 10/29/2017  . Dysphagia 01/19/2017  . Health care maintenance 03/14/2015  . Constipation 09/17/2014  . Right leg numbness 08/02/2014  . Toenail fungus 01/30/2014  . Hypercholesterolemia 01/30/2014  . Rash 01/30/2014  . Breast tenderness 07/25/2013  . Sleep apnea 12/15/2012  . Mild depression  ([place]) 12/15/2012  . 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 Hematuria 12/15/2012   4:04 PM, 05/21/20 Margarito Liner, SPT Student Physical Therapist [place]  [phone]  Margarito Liner 05/21/2020, 4:04 PM  [facility] [address], [place], [address] Phone: [phone]   Fax:  [phone]  Name: Ann Osborne MRN: [id number] Date of Birth: [birth year]

## 2020-05-23 ENCOUNTER — Ambulatory Visit: Payer: Medicare Other

## 2020-05-23 ENCOUNTER — Other Ambulatory Visit: Payer: Self-pay

## 2020-05-23 DIAGNOSIS — M25612 Stiffness of left shoulder, not elsewhere classified: Secondary | ICD-10-CM

## 2020-05-23 DIAGNOSIS — M25511 Pain in right shoulder: Secondary | ICD-10-CM | POA: Diagnosis not present

## 2020-05-23 DIAGNOSIS — M25512 Pain in left shoulder: Secondary | ICD-10-CM

## 2020-05-23 DIAGNOSIS — M25611 Stiffness of right shoulder, not elsewhere classified: Secondary | ICD-10-CM

## 2020-05-23 NOTE — Therapy (Signed)
[facility] [address] [place], [address] Phone: [phone]   Fax:  [phone]  Physical Therapy Treatment  Patient Details  Name: Ann Osborne MRN: [id number] Date of Birth: Oct 20, 1951 Referring Provider (PT): [name], MD    Encounter Date: 05/23/2020   PT End of Session - 05/23/20 1606    Visit Number 3    Number of Visits 13    Date for PT Re-Evaluation 06/27/20    Authorization Type 3/10    PT Start Time 1600    PT Stop Time 1645    PT Time Calculation (min) 45 min    Activity Tolerance Patient tolerated treatment well;No increased pain    Behavior During Therapy WFL for tasks assessed/performed           Past Medical History:  Diagnosis Date  . ADD (attention deficit disorder)   . Allergy   . Depression   . GERD (gastroesophageal reflux disease)   . Hyperparathyroidism ([place])   . Lyme disease 6-7 YEARS   history    Past Surgical History:  Procedure Laterality Date  . BREAST BIOPSY Left 20 yrs ago  . EYE SURGERY Bilateral 2007 OR 2008   LENS REPLACEMENT   . THYROIDECTOMY Left 11/19/2017   Procedure: LEFT INFERIOR THYROIDECTOMY;  Surgeon: Armandina Gemma, MD;  Location: [facility];  Service: General;  Laterality: Left;  . TUBAL LIGATION  1978   bilateral  . WISDOM TOOTH EXTRACTION  1980    There were no vitals filed for this visit.   Subjective Assessment - 05/23/20 1604    Subjective Patient reports soreness after last session that went away after one day.    Pertinent History High Cholesterol    Limitations Lifting    Patient Stated Goals Patients goal is to be able to be active with less shoulder pain.    Currently in Pain? No/denies           INTERVENTIONS    Therapeutic Exercise    -Bilat Shoulder Wall Slides x10 -YTB TP, House, Skyscraper 2x10  -Shoulder Flexion 2lbs 2x10 -Shoulder Abduction 2lbs 2x10 -Straight Arm Pulldown 2x15 -RTB GHJ ER 2x15  -RTB  Straight Arm Pull away 2x10 -BTB Rows 2x15 -Supine Hands Clasped Shoulder Flexion AAROM x10 -[employer] ER PNF Pattern D2 x10 YTB   Performed exercises to improve shoulder strength/stability and ROM        PT Education - 05/23/20 1605    Education Details form/technique for exercise    Person(s) Educated Patient    Methods Explanation;Demonstration    Comprehension Verbalized understanding;Returned demonstration            PT Short Term Goals - 05/16/20 1634      PT SHORT TERM GOAL #1   Title Patient's worst pain will decrease to 5/10NPS after working in the yard for 1 hour.    Baseline 8/10 NPS    Time 3    Period Weeks    Status New    Target Date 06/06/20             PT Long Term Goals - 05/16/20 1636      PT LONG TERM GOAL #1   Title Patients worst pain will decrease to 1/10NPS after working in the yard for 2 or more hours.    Baseline 8/10NPS    Time 6    Period Weeks    Status New    Target Date 06/27/20  PT LONG TERM GOAL #2   Title Patients will increase FOTO score to 67 to show an improvement in functional activites.    Baseline 54    Time 6    Period Weeks    Status New    Target Date 06/27/20      PT LONG TERM GOAL #3   Title Patient will be able to complete household tasks without an increase in pain to show improvement in tasks independence.    Time 6    Period Weeks    Status New    Target Date 06/27/20                 Plan - 05/23/20 1608    Clinical Impression Statement Continued strengthening shoulder complex and began working on more overhead movements. Patient was able to tolerate more overhead movements without any pain during today's session which is an improvement when compared to previous sessions. Patient will benefit from skilled therapy to progress towards goals and return to prior level of function.    Personal Factors and Comorbidities Comorbidity 1;Age;Fitness    Comorbidities High Cholestorl     Examination-Activity Limitations Reach Overhead;Lift    Examination-Participation Restrictions Yard Work;Cleaning    Stability/Clinical Decision Making Stable/Uncomplicated    Clinical Decision Making Low    Rehab Potential Good    PT Frequency 2x / week    PT Duration 6 weeks    PT Treatment/Interventions ADLs/Self Care Home Management;Electrical Stimulation;Moist Heat;Ultrasound;Traction;Gait training;Functional mobility training;Therapeutic activities;Therapeutic exercise;Balance training;Neuromuscular re-education;Patient/family education;Manual techniques;Passive range of motion;Dry needling;Energy conservation;Spinal Manipulations;Joint Manipulations;Stair training    PT Next Visit Plan strengthening of shoulder complex and OH movements    PT Home Exercise Plan AAROM supine hands clasped shoulder flexion, Scapular retraction, and wall slide s    Consulted and Agree with Plan of Care Patient           Patient will benefit from skilled therapeutic intervention in order to improve the following deficits and impairments:  Decreased balance, Decreased activity tolerance, Decreased endurance, Decreased range of motion, Decreased strength, Impaired UE functional use, Pain, Impaired flexibility, Decreased mobility  Visit Diagnosis: Acute pain of right shoulder  Acute pain of left shoulder  Stiffness of right shoulder, not elsewhere classified  Stiffness of left shoulder, not elsewhere classified     Problem List Patient Active Problem List   Diagnosis Date Noted  . Bilateral shoulder pain 04/03/2020  . Abnormal Pap smear of cervix 07/24/2019  . Cough 09/05/2018  . Low serum vitamin B12 09/02/2018  . Hyperparathyroidism, primary ([place]) 11/18/2017  . Memory change 10/29/2017  . Dysphagia 01/19/2017  . Health care maintenance 03/14/2015  . Constipation 09/17/2014  . Right leg numbness 08/02/2014  . Toenail fungus 01/30/2014  . Hypercholesterolemia 01/30/2014  . Rash 01/30/2014   . Breast tenderness 07/25/2013  . Sleep apnea 12/15/2012  . Mild depression ([place]) 12/15/2012  . 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 Hematuria 12/15/2012   4:45 PM, 05/23/20 Margarito Liner, SPT Student Physical Therapist [place]  [phone]  Margarito Liner 05/23/2020, 4:42 PM  [facility] [address] [place], [address] Phone: [phone]   Fax:  [phone]  Name: Ann Osborne MRN: [id number] Date of Birth: [birth year]

## 2020-05-28 ENCOUNTER — Ambulatory Visit: Payer: Medicare Other

## 2020-05-28 ENCOUNTER — Other Ambulatory Visit: Payer: Self-pay

## 2020-05-28 DIAGNOSIS — M25511 Pain in right shoulder: Secondary | ICD-10-CM

## 2020-05-28 DIAGNOSIS — M25611 Stiffness of right shoulder, not elsewhere classified: Secondary | ICD-10-CM

## 2020-05-28 DIAGNOSIS — M25612 Stiffness of left shoulder, not elsewhere classified: Secondary | ICD-10-CM | POA: Diagnosis not present

## 2020-05-28 DIAGNOSIS — M25512 Pain in left shoulder: Secondary | ICD-10-CM | POA: Diagnosis not present

## 2020-05-28 NOTE — Therapy (Signed)
[facility] [address] [place], [address] Phone: [phone]   Fax:  [phone]  Physical Therapy Treatment  Patient Details  Name: Ann Osborne MRN: [id number] Date of Birth: Jan 03, 1951 Referring Provider (PT): [name], MD    Encounter Date: 05/28/2020   PT End of Session - 05/28/20 1520    Visit Number 4    Number of Visits 13    Date for PT Re-Evaluation 06/27/20    Authorization Type 4/10    Activity Tolerance Patient tolerated treatment well;No increased pain    Behavior During Therapy WFL for tasks assessed/performed           Past Medical History:  Diagnosis Date  . ADD (attention deficit disorder)   . Allergy   . Depression   . GERD (gastroesophageal reflux disease)   . Hyperparathyroidism ([place])   . Lyme disease 6-7 YEARS   history    Past Surgical History:  Procedure Laterality Date  . BREAST BIOPSY Left 20 yrs ago  . EYE SURGERY Bilateral 2007 OR 2008   LENS REPLACEMENT   . THYROIDECTOMY Left 11/19/2017   Procedure: LEFT INFERIOR THYROIDECTOMY;  Surgeon: Armandina Gemma, MD;  Location: [facility];  Service: General;  Laterality: Left;  . TUBAL LIGATION  1978   bilateral  . WISDOM TOOTH EXTRACTION  1980    There were no vitals filed for this visit.   Subjective Assessment - 05/28/20 1518    Subjective Patient reports soreness after last session.    Pertinent History High Cholesterol    Limitations Lifting    Patient Stated Goals Patients goal is to be able to be active with less shoulder pain.    Currently in Pain? No/denies            INTERVENTIONS     Therapeutic Exercise    -Bilat Shoulder Wall Slides x20 -YTB TP, House, Skyscraper 2x10  -Shoulder Flexion 3lbs 2x10 (increased from 2lbs this session) -Shoulder Abduction 3lbs 2x10 (increased from 2lbs this session) -Straight Arm Pulldown GTB 2x15 -RTB GHJ ER 2x15  -RTB Straight Arm Pull away 2x10 -BTB Rows  2x15 -Supine Hands Clasped Shoulder Flexion AAROM x10 -[employer] ER PNF Pattern D2  x10 YTB    Performed exercises to improve shoulder strength/stability and ROM     PT Education - 05/28/20 1520    Education Details form/technique for exercises    Person(s) Educated Patient    Methods Explanation;Demonstration    Comprehension Verbalized understanding;Returned demonstration            PT Short Term Goals - 05/16/20 1634      PT SHORT TERM GOAL #1   Title Patient's worst pain will decrease to 5/10NPS after working in the yard for 1 hour.    Baseline 8/10 NPS    Time 3    Period Weeks    Status New    Target Date 06/06/20             PT Long Term Goals - 05/16/20 1636      PT LONG TERM GOAL #1   Title Patients worst pain will decrease to 1/10NPS after working in the yard for 2 or more hours.    Baseline 8/10NPS    Time 6    Period Weeks    Status New    Target Date 06/27/20      PT LONG TERM GOAL #2   Title Patients will increase FOTO score to  67 to show an improvement in functional activites.    Baseline 54    Time 6    Period Weeks    Status New    Target Date 06/27/20      PT LONG TERM GOAL #3   Title Patient will be able to complete household tasks without an increase in pain to show improvement in tasks independence.    Time 6    Period Weeks    Status New    Target Date 06/27/20                 Plan - 05/28/20 1521    Clinical Impression Statement Continued working on strengthening and stability and began to increase volume at todays session and patient tolerated this well.  Patients shoulder AROM is improving and this is demonstrated by an increase in shoulder flexion during wall slides without pain or symptoms.  Patient is making progress towards goals and will benefit from skilled therapy to return to prior level of function.    Personal Factors and Comorbidities Comorbidity 1;Age;Fitness    Comorbidities High Cholestorl     Examination-Activity Limitations Reach Overhead;Lift    Examination-Participation Restrictions Yard Work;Cleaning    Stability/Clinical Decision Making Stable/Uncomplicated    Clinical Decision Making Low    Rehab Potential Good    PT Frequency 2x / week    PT Duration 6 weeks    PT Treatment/Interventions ADLs/Self Care Home Management;Electrical Stimulation;Moist Heat;Ultrasound;Traction;Gait training;Functional mobility training;Therapeutic activities;Therapeutic exercise;Balance training;Neuromuscular re-education;Patient/family education;Manual techniques;Passive range of motion;Dry needling;Energy conservation;Spinal Manipulations;Joint Manipulations;Stair training    PT Next Visit Plan strengthening of shoulder complex and OH movements    PT Home Exercise Plan AAROM supine hands clasped shoulder flexion, Scapular retraction, and wall slide s    Consulted and Agree with Plan of Care Patient           Patient will benefit from skilled therapeutic intervention in order to improve the following deficits and impairments:  Decreased balance, Decreased activity tolerance, Decreased endurance, Decreased range of motion, Decreased strength, Impaired UE functional use, Pain, Impaired flexibility, Decreased mobility  Visit Diagnosis: Acute pain of right shoulder  Acute pain of left shoulder  Stiffness of right shoulder, not elsewhere classified  Stiffness of left shoulder, not elsewhere classified     Problem List Patient Active Problem List   Diagnosis Date Noted  . Bilateral shoulder pain 04/03/2020  . Abnormal Pap smear of cervix 07/24/2019  . Cough 09/05/2018  . Low serum vitamin B12 09/02/2018  . Hyperparathyroidism, primary ([place]) 11/18/2017  . Memory change 10/29/2017  . Dysphagia 01/19/2017  . Health care maintenance 03/14/2015  . Constipation 09/17/2014  . Right leg numbness 08/02/2014  . Toenail fungus 01/30/2014  . Hypercholesterolemia 01/30/2014  . Rash 01/30/2014   . Breast tenderness 07/25/2013  . Sleep apnea 12/15/2012  . Mild depression ([place]) 12/15/2012  . 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 Hematuria 12/15/2012   4:06 PM, 05/28/20 Margarito Liner, SPT Student Physical Therapist [place]  [phone]  Margarito Liner 05/28/2020, 4:05 PM  [facility] [address], [place], [address] Phone: [phone]   Fax:  [phone]  Name: Ann Osborne MRN: [id number] Date of Birth: 1951/01/05

## 2020-05-30 ENCOUNTER — Ambulatory Visit: Payer: Medicare Other

## 2020-06-04 ENCOUNTER — Ambulatory Visit: Payer: Medicare Other

## 2020-06-04 ENCOUNTER — Other Ambulatory Visit: Payer: Self-pay

## 2020-06-04 DIAGNOSIS — M25511 Pain in right shoulder: Secondary | ICD-10-CM | POA: Diagnosis not present

## 2020-06-04 DIAGNOSIS — M25612 Stiffness of left shoulder, not elsewhere classified: Secondary | ICD-10-CM

## 2020-06-04 DIAGNOSIS — M25611 Stiffness of right shoulder, not elsewhere classified: Secondary | ICD-10-CM | POA: Diagnosis not present

## 2020-06-04 DIAGNOSIS — M25512 Pain in left shoulder: Secondary | ICD-10-CM

## 2020-06-04 NOTE — Therapy (Signed)
[facility] [address] [place], [address] Phone: [phone]   Fax:  [phone]  Physical Therapy Treatment  Patient Details  Name: Ann Osborne MRN: [id number] Date of Birth: January 13, 1951 Referring Provider (PT): [name], MD    Encounter Date: 06/04/2020   PT End of Session - 06/04/20 1518    Visit Number 5    Number of Visits 13    Date for PT Re-Evaluation 06/27/20    Authorization Type 5/10    PT Start Time 1515    PT Stop Time 1600    PT Time Calculation (min) 45 min    Activity Tolerance Patient tolerated treatment well;No increased pain    Behavior During Therapy WFL for tasks assessed/performed           Past Medical History:  Diagnosis Date  . ADD (attention deficit disorder)   . Allergy   . Depression   . GERD (gastroesophageal reflux disease)   . Hyperparathyroidism ([place])   . Lyme disease 6-7 YEARS   history    Past Surgical History:  Procedure Laterality Date  . BREAST BIOPSY Left 20 yrs ago  . EYE SURGERY Bilateral 2007 OR 2008   LENS REPLACEMENT   . THYROIDECTOMY Left 11/19/2017   Procedure: LEFT INFERIOR THYROIDECTOMY;  Surgeon: Armandina Gemma, MD;  Location: [facility];  Service: General;  Laterality: Left;  . TUBAL LIGATION  1978   bilateral  . WISDOM TOOTH EXTRACTION  1980    There were no vitals filed for this visit.   Subjective Assessment - 06/04/20 1516    Subjective Patient reports soreness over this weekend in her shoulders.    Pertinent History High Cholesterol    Limitations Lifting    Patient Stated Goals Patients goal is to be able to be active with less shoulder pain.    Currently in Pain? No/denies            INTERVENTIONS     Therapeutic Exercise    -Bilat Shoulder Wall Slides x20 -RTB TP, House, Skyscraper 2x10  -Shoulder Flexion 3lbs 2x15 (increased from 2lbs this session) -Shoulder Abduction 3lbs 2x15 (increased from 2lbs this  session) -Straight Arm Pulldown GTB 2x15 -RTB GHJ ER 2x15  -RTB Straight Arm Pull away 2x10 -OMEGA Row 2x15 15lbs -BodyBlade 2x30sec bilat  -[employer] PNF Pattern D2 x10 YTB    Performed exercises to improve shoulder strength/stability and ROM    PT Education - 06/04/20 1518    Education Details form/technique for exercises    Person(s) Educated Patient    Methods Explanation;Demonstration    Comprehension Verbalized understanding;Returned demonstration            PT Short Term Goals - 05/16/20 1634      PT SHORT TERM GOAL #1   Title Patient's worst pain will decrease to 5/10NPS after working in the yard for 1 hour.    Baseline 8/10 NPS    Time 3    Period Weeks    Status New    Target Date 06/06/20             PT Long Term Goals - 05/16/20 1636      PT LONG TERM GOAL #1   Title Patients worst pain will decrease to 1/10NPS after working in the yard for 2 or more hours.    Baseline 8/10NPS    Time 6    Period Weeks    Status New    Target  Date 06/27/20      PT LONG TERM GOAL #2   Title Patients will increase FOTO score to 67 to show an improvement in functional activites.    Baseline 54    Time 6    Period Weeks    Status New    Target Date 06/27/20      PT LONG TERM GOAL #3   Title Patient will be able to complete household tasks without an increase in pain to show improvement in tasks independence.    Time 6    Period Weeks    Status New    Target Date 06/27/20                 Plan - 06/04/20 1519    Clinical Impression Statement Continued addressing patient's strength, stability, and ROM during today's session. Patient's AROM is progressing without as much pain, demonstrated by better vertical excursion during exercises. Patient has trouble with motor control and will be addressed in future sessions. Patient will benefit from skilled therapy to progress towards goals and return to prior level of function.    Personal Factors and Comorbidities  Comorbidity 1;Age;Fitness    Comorbidities High Cholestorl    Examination-Activity Limitations Reach Overhead;Lift    Examination-Participation Restrictions Yard Work;Cleaning    Stability/Clinical Decision Making Stable/Uncomplicated    Clinical Decision Making Low    Rehab Potential Good    PT Frequency 2x / week    PT Duration 6 weeks    PT Treatment/Interventions ADLs/Self Care Home Management;Electrical Stimulation;Moist Heat;Ultrasound;Traction;Gait training;Functional mobility training;Therapeutic activities;Therapeutic exercise;Balance training;Neuromuscular re-education;Patient/family education;Manual techniques;Passive range of motion;Dry needling;Energy conservation;Spinal Manipulations;Joint Manipulations;Stair training    PT Next Visit Plan strengthening of shoulder complex and OH movements    PT Home Exercise Plan AAROM supine hands clasped shoulder flexion, Scapular retraction, and wall slide s    Consulted and Agree with Plan of Care Patient           Patient will benefit from skilled therapeutic intervention in order to improve the following deficits and impairments:  Decreased balance, Decreased activity tolerance, Decreased endurance, Decreased range of motion, Decreased strength, Impaired UE functional use, Pain, Impaired flexibility, Decreased mobility  Visit Diagnosis: Acute pain of right shoulder  Acute pain of left shoulder  Stiffness of right shoulder, not elsewhere classified  Stiffness of left shoulder, not elsewhere classified     Problem List Patient Active Problem List   Diagnosis Date Noted  . Bilateral shoulder pain 04/03/2020  . Abnormal Pap smear of cervix 07/24/2019  . Cough 09/05/2018  . Low serum vitamin B12 09/02/2018  . Hyperparathyroidism, primary ([place]) 11/18/2017  . Memory change 10/29/2017  . Dysphagia 01/19/2017  . Health care maintenance 03/14/2015  . Constipation 09/17/2014  . Right leg numbness 08/02/2014  . Toenail fungus  01/30/2014  . Hypercholesterolemia 01/30/2014  . Rash 01/30/2014  . Breast tenderness 07/25/2013  . Sleep apnea 12/15/2012  . Mild depression ([place]) 12/15/2012  . 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 Hematuria 12/15/2012   4:16 PM, 06/04/20 Margarito Liner, SPT Student Physical Therapist [place]  [phone]  Margarito Liner 06/04/2020, 4:15 PM  [facility] [address], [place], [address] Phone: [phone]   Fax:  [phone]  Name: Ann Osborne MRN: [id number] Date of Birth: 12/05/1950

## 2020-06-06 ENCOUNTER — Other Ambulatory Visit: Payer: Self-pay

## 2020-06-06 ENCOUNTER — Ambulatory Visit: Payer: Medicare Other

## 2020-06-06 DIAGNOSIS — M25611 Stiffness of right shoulder, not elsewhere classified: Secondary | ICD-10-CM | POA: Diagnosis not present

## 2020-06-06 DIAGNOSIS — M25612 Stiffness of left shoulder, not elsewhere classified: Secondary | ICD-10-CM | POA: Diagnosis not present

## 2020-06-06 DIAGNOSIS — M25512 Pain in left shoulder: Secondary | ICD-10-CM | POA: Diagnosis not present

## 2020-06-06 DIAGNOSIS — M25511 Pain in right shoulder: Secondary | ICD-10-CM | POA: Diagnosis not present

## 2020-06-06 NOTE — Therapy (Signed)
[facility] [address] [place], [address] Phone: [phone]   Fax:  [phone]  Physical Therapy Treatment  Patient Details  Name: ARDYTHE KLUTE MRN: [id number] Date of Birth: Dec 20, 1950 Referring Provider (PT): [name], MD    Encounter Date: 06/06/2020   PT End of Session - 06/06/20 1524    Visit Number 6    Number of Visits 13    Date for PT Re-Evaluation 06/27/20    Authorization Type 6/10    PT Start Time 1520    PT Stop Time 1600    PT Time Calculation (min) 40 min    Activity Tolerance Patient tolerated treatment well;No increased pain    Behavior During Therapy WFL for tasks assessed/performed           Past Medical History:  Diagnosis Date  . ADD (attention deficit disorder)   . Allergy   . Depression   . GERD (gastroesophageal reflux disease)   . Hyperparathyroidism ([place])   . Lyme disease 6-7 YEARS   history    Past Surgical History:  Procedure Laterality Date  . BREAST BIOPSY Left 20 yrs ago  . EYE SURGERY Bilateral 2007 OR 2008   LENS REPLACEMENT   . THYROIDECTOMY Left 11/19/2017   Procedure: LEFT INFERIOR THYROIDECTOMY;  Surgeon: Armandina Gemma, MD;  Location: [facility];  Service: General;  Laterality: Left;  . TUBAL LIGATION  1978   bilateral  . WISDOM TOOTH EXTRACTION  1980    There were no vitals filed for this visit.   Subjective Assessment - 06/06/20 1522    Subjective Patient reports working out in the yard yesterday and does not have any pain today.    Pertinent History High Cholesterol    Limitations Lifting    Patient Stated Goals Patients goal is to be able to be active with less shoulder pain.    Currently in Pain? No/denies            INTERVENTIONS     Therapeutic Exercise    -Bilat Shoulder Wall Slides x20 -RTB TP, House, Skyscraper 2x10  -Shoulder Flexion 3lbs 2x15  -Shoulder Abduction 3lbs 2x15  -Straight Arm Pulldown GTB 2x15 -Shoulder  Press 2x10 8lbs -RTB GHJ ER 2x15  -RTB Straight Arm Pull away 2x10 -OMEGA Seated Row 2x15 20lbs   Performed exercises to improve shoulder strength/stability and ROM    PT Education - 06/06/20 1523    Education Details form/technique for exercises    Person(s) Educated Patient    Methods Explanation;Demonstration    Comprehension Verbalized understanding;Returned demonstration            PT Short Term Goals - 05/16/20 1634      PT SHORT TERM GOAL #1   Title Patient's worst pain will decrease to 5/10NPS after working in the yard for 1 hour.    Baseline 8/10 NPS    Time 3    Period Weeks    Status New    Target Date 06/06/20             PT Long Term Goals - 05/16/20 1636      PT LONG TERM GOAL #1   Title Patients worst pain will decrease to 1/10NPS after working in the yard for 2 or more hours.    Baseline 8/10NPS    Time 6    Period Weeks    Status New    Target Date 06/27/20      PT  LONG TERM GOAL #2   Title Patients will increase FOTO score to 67 to show an improvement in functional activites.    Baseline 54    Time 6    Period Weeks    Status New    Target Date 06/27/20      PT LONG TERM GOAL #3   Title Patient will be able to complete household tasks without an increase in pain to show improvement in tasks independence.    Time 6    Period Weeks    Status New    Target Date 06/27/20                 Plan - 06/06/20 1524    Clinical Impression Statement PatientContinued to address shoulder strength and stability and progressed exercises that patient tolerated well.  Patient continues to have shoulder pain during AROM flexion that began to decrease with more repetitions. Patient has been able to increase activities around the house and not have pain per patient report.  Patient will continue to benefit from skilled therapy to return to prior level of function.    Personal Factors and Comorbidities Comorbidity 1;Age;Fitness    Comorbidities High  Cholestorl    Examination-Activity Limitations Reach Overhead;Lift    Examination-Participation Restrictions Yard Work;Cleaning    Stability/Clinical Decision Making Stable/Uncomplicated    Clinical Decision Making Low    Rehab Potential Good    PT Frequency 2x / week    PT Duration 6 weeks    PT Treatment/Interventions ADLs/Self Care Home Management;Electrical Stimulation;Moist Heat;Ultrasound;Traction;Gait training;Functional mobility training;Therapeutic activities;Therapeutic exercise;Balance training;Neuromuscular re-education;Patient/family education;Manual techniques;Passive range of motion;Dry needling;Energy conservation;Spinal Manipulations;Joint Manipulations;Stair training    PT Next Visit Plan strengthening of shoulder complex and OH movements    PT Home Exercise Plan AAROM supine hands clasped shoulder flexion, Scapular retraction, and wall slide s    Consulted and Agree with Plan of Care Patient           Patient will benefit from skilled therapeutic intervention in order to improve the following deficits and impairments:  Decreased balance, Decreased activity tolerance, Decreased endurance, Decreased range of motion, Decreased strength, Impaired UE functional use, Pain, Impaired flexibility, Decreased mobility  Visit Diagnosis: Acute pain of right shoulder  Acute pain of left shoulder  Stiffness of right shoulder, not elsewhere classified  Stiffness of left shoulder, not elsewhere classified     Problem List Patient Active Problem List   Diagnosis Date Noted  . Bilateral shoulder pain 04/03/2020  . Abnormal Pap smear of cervix 07/24/2019  . Cough 09/05/2018  . Low serum vitamin B12 09/02/2018  . Hyperparathyroidism, primary ([place]) 11/18/2017  . Memory change 10/29/2017  . Dysphagia 01/19/2017  . Health care maintenance 03/14/2015  . Constipation 09/17/2014  . Right leg numbness 08/02/2014  . Toenail fungus 01/30/2014  . Hypercholesterolemia 01/30/2014  .  Rash 01/30/2014  . Breast tenderness 07/25/2013  . Sleep apnea 12/15/2012  . Mild depression ([place]) 12/15/2012  . 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 Hematuria 12/15/2012   4:00 PM, 06/06/20 Margarito Liner, SPT Student Physical Therapist [place]  [phone]  Margarito Liner 06/06/2020, 3:56 PM  [facility] [address], [place], [address] Phone: [phone]   Fax:  [phone]  Name: SPRUHA WEIGHT MRN: [id number] Date of Birth: 12-Oct-1951

## 2020-06-11 ENCOUNTER — Other Ambulatory Visit: Payer: Self-pay

## 2020-06-11 ENCOUNTER — Ambulatory Visit: Payer: Medicare Other | Attending: Internal Medicine

## 2020-06-11 DIAGNOSIS — M25512 Pain in left shoulder: Secondary | ICD-10-CM | POA: Diagnosis not present

## 2020-06-11 DIAGNOSIS — M25612 Stiffness of left shoulder, not elsewhere classified: Secondary | ICD-10-CM | POA: Insufficient documentation

## 2020-06-11 DIAGNOSIS — M25511 Pain in right shoulder: Secondary | ICD-10-CM | POA: Diagnosis not present

## 2020-06-11 DIAGNOSIS — M25611 Stiffness of right shoulder, not elsewhere classified: Secondary | ICD-10-CM | POA: Insufficient documentation

## 2020-06-11 NOTE — Therapy (Signed)
[facility] [address] [place], [address] Phone: [phone]   Fax:  [phone]  Physical Therapy Treatment  Patient Details  Name: Ann Osborne MRN: [id number] Date of Birth: 04/12/1951 Referring Provider (PT): [name], MD    Encounter Date: 06/11/2020   PT End of Session - 06/11/20 1619    Visit Number 7    Number of Visits 13    Date for PT Re-Evaluation 06/27/20    Authorization Type 7/10    PT Start Time 5625    PT Stop Time 1700    PT Time Calculation (min) 45 min    Activity Tolerance Patient tolerated treatment well;No increased pain    Behavior During Therapy WFL for tasks assessed/performed           Past Medical History:  Diagnosis Date  . ADD (attention deficit disorder)   . Allergy   . Depression   . GERD (gastroesophageal reflux disease)   . Hyperparathyroidism ([place])   . Lyme disease 6-7 YEARS   history    Past Surgical History:  Procedure Laterality Date  . BREAST BIOPSY Left 20 yrs ago  . EYE SURGERY Bilateral 2007 OR 2008   LENS REPLACEMENT   . THYROIDECTOMY Left 11/19/2017   Procedure: LEFT INFERIOR THYROIDECTOMY;  Surgeon: Armandina Gemma, MD;  Location: [facility];  Service: General;  Laterality: Left;  . TUBAL LIGATION  1978   bilateral  . WISDOM TOOTH EXTRACTION  1980    There were no vitals filed for this visit.   Subjective Assessment - 06/11/20 1618    Subjective Patient reports shoulders are fine and hasnt been able to perform HEP due to being out of town and busy.    Pertinent History High Cholesterol    Limitations Lifting    Patient Stated Goals Patients goal is to be able to be active with less shoulder pain.    Currently in Pain? No/denies            INTERVENTIONS   Manual Therapy A-P Shoulder Mobilization  Inferior Shoulder Mobilization   Functional Behind Back Reach  Left: T10 Right: Mid Sacrum     Therapeutic Exercise    -Bilat  Shoulder Wall Slides x20 -RTB TP, House, Skyscraper 2x15  -Shoulder Flexion 3lbs 2x15  -Shoulder Abduction 3lbs 2x15    Performed exercises to improve shoulder strength/stability and ROM      PT Education - 06/11/20 1619    Education Details form/technique for exercises    Person(s) Educated Patient    Methods Explanation;Demonstration    Comprehension Verbalized understanding;Returned demonstration            PT Short Term Goals - 05/16/20 1634      PT SHORT TERM GOAL #1   Title Patient's worst pain will decrease to 5/10NPS after working in the yard for 1 hour.    Baseline 8/10 NPS    Time 3    Period Weeks    Status New    Target Date 06/06/20             PT Long Term Goals - 05/16/20 1636      PT LONG TERM GOAL #1   Title Patients worst pain will decrease to 1/10NPS after working in the yard for 2 or more hours.    Baseline 8/10NPS    Time 6    Period Weeks    Status New    Target Date 06/27/20  PT LONG TERM GOAL #2   Title Patients will increase FOTO score to 67 to show an improvement in functional activites.    Baseline 54    Time 6    Period Weeks    Status New    Target Date 06/27/20      PT LONG TERM GOAL #3   Title Patient will be able to complete household tasks without an increase in pain to show improvement in tasks independence.    Time 6    Period Weeks    Status New    Target Date 06/27/20                 Plan - 06/11/20 1620    Clinical Impression Statement Focused on patient's shoulder strength and stability during todays session.  Patients end range AROM continues to cause pain but has been improving each session. Shoulder mobilizations increased ROM with less pain. Patient has deficits with functional reach behind back lacking IR of bilat shoulders. Patient is making improvements towards goals and will benefit from further skilled therapy to return to prior level of function.    Personal Factors and Comorbidities  Comorbidity 1;Age;Fitness    Comorbidities High Cholestorl    Examination-Activity Limitations Reach Overhead;Lift    Examination-Participation Restrictions Yard Work;Cleaning    Stability/Clinical Decision Making Stable/Uncomplicated    Clinical Decision Making Low    Rehab Potential Good    PT Frequency 2x / week    PT Duration 6 weeks    PT Treatment/Interventions ADLs/Self Care Home Management;Electrical Stimulation;Moist Heat;Ultrasound;Traction;Gait training;Functional mobility training;Therapeutic activities;Therapeutic exercise;Balance training;Neuromuscular re-education;Patient/family education;Manual techniques;Passive range of motion;Dry needling;Energy conservation;Spinal Manipulations;Joint Manipulations;Stair training    PT Next Visit Plan strengthening of shoulder complex and OH movements    PT Home Exercise Plan AAROM supine hands clasped shoulder flexion, Scapular retraction, and wall slide s    Consulted and Agree with Plan of Care Patient           Patient will benefit from skilled therapeutic intervention in order to improve the following deficits and impairments:  Decreased balance, Decreased activity tolerance, Decreased endurance, Decreased range of motion, Decreased strength, Impaired UE functional use, Pain, Impaired flexibility, Decreased mobility  Visit Diagnosis: Acute pain of right shoulder  Acute pain of left shoulder  Stiffness of right shoulder, not elsewhere classified  Stiffness of left shoulder, not elsewhere classified     Problem List Patient Active Problem List   Diagnosis Date Noted  . Bilateral shoulder pain 04/03/2020  . Abnormal Pap smear of cervix 07/24/2019  . Cough 09/05/2018  . Low serum vitamin B12 09/02/2018  . Hyperparathyroidism, primary ([place]) 11/18/2017  . Memory change 10/29/2017  . Dysphagia 01/19/2017  . Health care maintenance 03/14/2015  . Constipation 09/17/2014  . Right leg numbness 08/02/2014  . Toenail fungus  01/30/2014  . Hypercholesterolemia 01/30/2014  . Rash 01/30/2014  . Breast tenderness 07/25/2013  . Sleep apnea 12/15/2012  . Mild depression ([place]) 12/15/2012  . 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 Hematuria 12/15/2012   5:00 PM, 06/11/20 Margarito Liner, SPT Student Physical Therapist [place]  [phone]  Margarito Liner 06/11/2020, 4:59 PM  [facility] [address], [place], [address] Phone: [phone]   Fax:  [phone]  Name: AUNDREYA SOUFFRANT MRN: [id number] Date of Birth: [birth year]

## 2020-06-13 ENCOUNTER — Ambulatory Visit: Payer: Medicare Other

## 2020-06-13 ENCOUNTER — Other Ambulatory Visit: Payer: Self-pay

## 2020-06-13 DIAGNOSIS — M25511 Pain in right shoulder: Secondary | ICD-10-CM | POA: Diagnosis not present

## 2020-06-13 DIAGNOSIS — L309 Dermatitis, unspecified: Secondary | ICD-10-CM | POA: Diagnosis not present

## 2020-06-13 DIAGNOSIS — M25512 Pain in left shoulder: Secondary | ICD-10-CM

## 2020-06-13 DIAGNOSIS — M25612 Stiffness of left shoulder, not elsewhere classified: Secondary | ICD-10-CM | POA: Diagnosis not present

## 2020-06-13 DIAGNOSIS — M25611 Stiffness of right shoulder, not elsewhere classified: Secondary | ICD-10-CM | POA: Diagnosis not present

## 2020-06-13 NOTE — Therapy (Signed)
[facility] [address] [place], [address] Phone: [phone]   Fax:  [phone]  Physical Therapy Treatment  Patient Details  Name: Ann Osborne MRN: [id number] Date of Birth: [birth year] Referring Provider (PT): [name], MD    Encounter Date: 06/13/2020   PT End of Session - 06/13/20 1550    Visit Number 8    Number of Visits 13    Date for PT Re-Evaluation 06/27/20    Authorization Type 8/10    PT Start Time 2355    PT Stop Time 1630    PT Time Calculation (min) 45 min    Activity Tolerance Patient tolerated treatment well;No increased pain    Behavior During Therapy WFL for tasks assessed/performed           Past Medical History:  Diagnosis Date   ADD (attention deficit disorder)    Allergy    Depression    GERD (gastroesophageal reflux disease)    Hyperparathyroidism ([place])    Lyme disease 6-7 YEARS   history    Past Surgical History:  Procedure Laterality Date   BREAST BIOPSY Left 20 yrs ago   EYE SURGERY Bilateral 2007 OR 2008   LENS REPLACEMENT    THYROIDECTOMY Left 11/19/2017   Procedure: LEFT INFERIOR THYROIDECTOMY;  Surgeon: Armandina Gemma, MD;  Location: [facility];  Service: General;  Laterality: Left;   TUBAL LIGATION  1978   bilateral   [place]    There were no vitals filed for this visit.   Subjective Assessment - 06/13/20 1548    Subjective Patient reports shoulder was sore after last session.    Pertinent History High Cholesterol    Limitations Lifting    Patient Stated Goals Patients goal is to be able to be active with less shoulder pain.    Currently in Pain? No/denies           INTERVENTIONS    Manual Therapy A-P Shoulder Mobilization  Inferior Shoulder Mobilization Long Axis       Therapeutic Exercise  -Towel Pull AAROM for IR bilat. x8 bilat. -Bilat Shoulder Wall Slides x20 -RTB TP, House, Skyscraper 2x15   -Shoulder Flexion in Scaption 3lbs 2x15  -Shoulder Abduction 3lbs 2x15  -OMEGA Machine Rows Seated 2x12 15lbs    Performed exercises to improve shoulder strength/stability and ROM      PT Education - 06/13/20 1549    Education Details form/technique for exercises    Person(s) Educated Patient    Methods Explanation;Demonstration    Comprehension Verbalized understanding;Returned demonstration            PT Short Term Goals - 05/16/20 1634      PT SHORT TERM GOAL #1   Title Patient's worst pain will decrease to 5/10NPS after working in the yard for 1 hour.    Baseline 8/10 NPS    Time 3    Period Weeks    Status New    Target Date 06/06/20             PT Long Term Goals - 05/16/20 1636      PT LONG TERM GOAL #1   Title Patients worst pain will decrease to 1/10NPS after working in the yard for 2 or more hours.    Baseline 8/10NPS    Time 6    Period Weeks    Status New    Target Date 06/27/20      PT  LONG TERM GOAL #2   Title Patients will increase FOTO score to 67 to show an improvement in functional activites.    Baseline 54    Time 6    Period Weeks    Status New    Target Date 06/27/20      PT LONG TERM GOAL #3   Title Patient will be able to complete household tasks without an increase in pain to show improvement in tasks independence.    Time 6    Period Weeks    Status New    Target Date 06/27/20                 Plan - 06/13/20 1551    Clinical Impression Statement Addressed patients decreased bilateral shoulder IR ROM during todays session along with strengthening exercises.  Patient responded well to towel pull AAROM for shoulder IR and was able to increase ROM after performing exercise.  Patient was instructed on adding this exercise to HEP.  Patient will benefit from skilled therapy to return to prior level of function.    Personal Factors and Comorbidities Comorbidity 1;Age;Fitness    Comorbidities High Cholestorl     Examination-Activity Limitations Reach Overhead;Lift    Examination-Participation Restrictions Yard Work;Cleaning    Stability/Clinical Decision Making Stable/Uncomplicated    Clinical Decision Making Low    Rehab Potential Good    PT Frequency 2x / week    PT Duration 6 weeks    PT Treatment/Interventions ADLs/Self Care Home Management;Electrical Stimulation;Moist Heat;Ultrasound;Traction;Gait training;Functional mobility training;Therapeutic activities;Therapeutic exercise;Balance training;Neuromuscular re-education;Patient/family education;Manual techniques;Passive range of motion;Dry needling;Energy conservation;Spinal Manipulations;Joint Manipulations;Stair training    PT Next Visit Plan strengthening of shoulder complex and OH movements    PT Home Exercise Plan AAROM supine hands clasped shoulder flexion, Scapular retraction, and wall slide s    Consulted and Agree with Plan of Care Patient           Patient will benefit from skilled therapeutic intervention in order to improve the following deficits and impairments:  Decreased balance, Decreased activity tolerance, Decreased endurance, Decreased range of motion, Decreased strength, Impaired UE functional use, Pain, Impaired flexibility, Decreased mobility  Visit Diagnosis: Acute pain of right shoulder  Acute pain of left shoulder  Stiffness of right shoulder, not elsewhere classified  Stiffness of left shoulder, not elsewhere classified     Problem List Patient Active Problem List   Diagnosis Date Noted   Bilateral shoulder pain 04/03/2020   Abnormal Pap smear of cervix 07/24/2019   Cough 09/05/2018   Low serum vitamin B12 09/02/2018   Hyperparathyroidism, primary ([place]) 11/18/2017   Memory change 10/29/2017   Dysphagia 01/19/2017   Health care maintenance 03/14/2015   Constipation 09/17/2014   Right leg numbness 08/02/2014   Toenail fungus 01/30/2014   Hypercholesterolemia 01/30/2014   Rash 01/30/2014    Breast tenderness 07/25/2013   Sleep apnea 12/15/2012   Mild depression ([place]) 12/15/2012   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, [place], [address] Phone: [phone]   Fax:  [phone]  Name: ABBAGAIL SCAFF MRN: [id number] Date of Birth: 24-Sep-1951

## 2020-06-18 ENCOUNTER — Ambulatory Visit: Payer: Medicare Other

## 2020-06-18 ENCOUNTER — Other Ambulatory Visit: Payer: Self-pay

## 2020-06-18 DIAGNOSIS — M25511 Pain in right shoulder: Secondary | ICD-10-CM

## 2020-06-18 DIAGNOSIS — M25612 Stiffness of left shoulder, not elsewhere classified: Secondary | ICD-10-CM

## 2020-06-18 DIAGNOSIS — M25611 Stiffness of right shoulder, not elsewhere classified: Secondary | ICD-10-CM | POA: Diagnosis not present

## 2020-06-18 DIAGNOSIS — M25512 Pain in left shoulder: Secondary | ICD-10-CM

## 2020-06-18 NOTE — Therapy (Signed)
[facility] [address] [place], [address] Phone: [phone]   Fax:  [phone]  Physical Therapy Treatment  Patient Details  Name: Ann Osborne MRN: [id number] Date of Birth: 1951/08/11 Referring Provider (PT): [name], MD    Encounter Date: 06/18/2020   PT End of Session - 06/18/20 1302    Visit Number 9    Number of Visits 13    Date for PT Re-Evaluation 06/27/20    Authorization Type 9/10    PT Start Time 1300    PT Stop Time 1345    PT Time Calculation (min) 45 min    Activity Tolerance Patient tolerated treatment well;No increased pain    Behavior During Therapy WFL for tasks assessed/performed           Past Medical History:  Diagnosis Date  . ADD (attention deficit disorder)   . Allergy   . Depression   . GERD (gastroesophageal reflux disease)   . Hyperparathyroidism ([place])   . Lyme disease 6-7 YEARS   history    Past Surgical History:  Procedure Laterality Date  . BREAST BIOPSY Left 20 yrs ago  . EYE SURGERY Bilateral 2007 OR 2008   LENS REPLACEMENT   . THYROIDECTOMY Left 11/19/2017   Procedure: LEFT INFERIOR THYROIDECTOMY;  Surgeon: Armandina Gemma, MD;  Location: [facility];  Service: General;  Laterality: Left;  . TUBAL LIGATION  1978   bilateral  . WISDOM TOOTH EXTRACTION  1980    There were no vitals filed for this visit.   Subjective Assessment - 06/18/20 1301    Subjective Patient reports performing HEP and internal rotation has seen improvement.    Pertinent History High Cholesterol    Limitations Lifting    Patient Stated Goals Patients goal is to be able to be active with less shoulder pain.    Currently in Pain? No/denies           INTERVENTIONS    Manual Therapy A-P Shoulder Mobilization L UE  Inferior Shoulder Mobilization Long Axis L UE  PNF Shoulder IR  L UE       Therapeutic Exercise  -Towel Pull AAROM for IR bilat. X10 R UE  -Bilat Shoulder  Wall Slides x20 -RTB Isometric Flexion 2x15  -Shoulder Flexion in Scaption 3lbs 2x15  -Shoulder Abduction 3lbs 2x15  -OMEGA Machine Rows Seated 2x12 15lbs    Performed exercises to improve shoulder strength/stability and ROM     PT Education - 06/18/20 1302    Education Details form/technique for exercises    Person(s) Educated Patient    Methods Explanation;Demonstration    Comprehension Verbalized understanding;Returned demonstration            PT Short Term Goals - 05/16/20 1634      PT SHORT TERM GOAL #1   Title Patient's worst pain will decrease to 5/10NPS after working in the yard for 1 hour.    Baseline 8/10 NPS    Time 3    Period Weeks    Status New    Target Date 06/06/20             PT Long Term Goals - 05/16/20 1636      PT LONG TERM GOAL #1   Title Patients worst pain will decrease to 1/10NPS after working in the yard for 2 or more hours.    Baseline 8/10NPS    Time 6    Period Weeks    Status  New    Target Date 06/27/20      PT LONG TERM GOAL #2   Title Patients will increase FOTO score to 67 to show an improvement in functional activites.    Baseline 54    Time 6    Period Weeks    Status New    Target Date 06/27/20      PT LONG TERM GOAL #3   Title Patient will be able to complete household tasks without an increase in pain to show improvement in tasks independence.    Time 6    Period Weeks    Status New    Target Date 06/27/20                 Plan - 06/18/20 1303    Clinical Impression Statement Patients Shoulder IR is improving bilaterally but R shoulder IR is more limited and painful during AAROM.  Performed grade 3 mobilizations along with isometrics to R shoulder which helped decrease pain and improve IR during towel pull exercise. Patient continues to have soreness/pain during midrange flexion, but it's relieved when exercise is in scaption plane. Patient will continue to benefit from skilled therapy to return to prior level  of function.    Personal Factors and Comorbidities Comorbidity 1;Age;Fitness    Comorbidities High Cholestorl    Examination-Activity Limitations Reach Overhead;Lift    Examination-Participation Restrictions Yard Work;Cleaning    Stability/Clinical Decision Making Stable/Uncomplicated    Clinical Decision Making Low    Rehab Potential Good    PT Frequency 2x / week    PT Duration 6 weeks    PT Treatment/Interventions ADLs/Self Care Home Management;Electrical Stimulation;Moist Heat;Ultrasound;Traction;Gait training;Functional mobility training;Therapeutic activities;Therapeutic exercise;Balance training;Neuromuscular re-education;Patient/family education;Manual techniques;Passive range of motion;Dry needling;Energy conservation;Spinal Manipulations;Joint Manipulations;Stair training    PT Next Visit Plan strengthening of shoulder complex and OH movements    PT Home Exercise Plan AAROM supine hands clasped shoulder flexion, Scapular retraction, and wall slides, Towel AAROM IR    Consulted and Agree with Plan of Care Patient           Patient will benefit from skilled therapeutic intervention in order to improve the following deficits and impairments:  Decreased balance, Decreased activity tolerance, Decreased endurance, Decreased range of motion, Decreased strength, Impaired UE functional use, Pain, Impaired flexibility, Decreased mobility  Visit Diagnosis: Acute pain of right shoulder  Acute pain of left shoulder  Stiffness of right shoulder, not elsewhere classified  Stiffness of left shoulder, not elsewhere classified     Problem List Patient Active Problem List   Diagnosis Date Noted  . Bilateral shoulder pain 04/03/2020  . Abnormal Pap smear of cervix 07/24/2019  . Cough 09/05/2018  . Low serum vitamin B12 09/02/2018  . Hyperparathyroidism, primary ([place]) 11/18/2017  . Memory change 10/29/2017  . Dysphagia 01/19/2017  . Health care maintenance 03/14/2015  . Constipation  09/17/2014  . Right leg numbness 08/02/2014  . Toenail fungus 01/30/2014  . Hypercholesterolemia 01/30/2014  . Rash 01/30/2014  . Breast tenderness 07/25/2013  . Sleep apnea 12/15/2012  . Mild depression ([place]) 12/15/2012  . 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 Hematuria 12/15/2012   1:45 PM, 06/18/20 Margarito Liner, SPT Student Physical Therapist [place]  [phone]  Margarito Liner 06/18/2020, 1:37 PM  [facility] [address] [place], [address] Phone: [phone]   Fax:  [phone]  Name: Ann Osborne MRN: [id number] Date of Birth: 1951-09-15

## 2020-06-19 DIAGNOSIS — B977 Papillomavirus as the cause of diseases classified elsewhere: Secondary | ICD-10-CM | POA: Diagnosis not present

## 2020-06-19 DIAGNOSIS — R87618 Other abnormal cytological findings on specimens from cervix uteri: Secondary | ICD-10-CM | POA: Diagnosis not present

## 2020-06-20 ENCOUNTER — Ambulatory Visit: Payer: Medicare Other

## 2020-06-20 ENCOUNTER — Other Ambulatory Visit: Payer: Self-pay

## 2020-06-20 DIAGNOSIS — M25612 Stiffness of left shoulder, not elsewhere classified: Secondary | ICD-10-CM | POA: Diagnosis not present

## 2020-06-20 DIAGNOSIS — M25611 Stiffness of right shoulder, not elsewhere classified: Secondary | ICD-10-CM | POA: Diagnosis not present

## 2020-06-20 DIAGNOSIS — M25511 Pain in right shoulder: Secondary | ICD-10-CM

## 2020-06-20 DIAGNOSIS — M25512 Pain in left shoulder: Secondary | ICD-10-CM | POA: Diagnosis not present

## 2020-06-20 NOTE — Therapy (Signed)
[facility] [address] [place], [address] Phone: [phone]   Fax:  [phone]  Physical Therapy Treatment/Progress Note  Patient Details  Name: Ann Osborne MRN: [id number] Date of Birth: 1951-06-25 Referring Provider (PT): [name], MD    Encounter Date: 06/20/2020   PT End of Session - 06/20/20 1611    Visit Number 10    Number of Visits 13    Date for PT Re-Evaluation 06/27/20    Authorization Type 10/10    PT Start Time 1600    PT Stop Time 1645    PT Time Calculation (min) 45 min    Activity Tolerance Patient tolerated treatment well;No increased pain    Behavior During Therapy WFL for tasks assessed/performed           Past Medical History:  Diagnosis Date   ADD (attention deficit disorder)    Allergy    Depression    GERD (gastroesophageal reflux disease)    Hyperparathyroidism ([place])    Lyme disease 6-7 YEARS   history    Past Surgical History:  Procedure Laterality Date   BREAST BIOPSY Left 20 yrs ago   EYE SURGERY Bilateral 2007 OR 2008   LENS REPLACEMENT    THYROIDECTOMY Left 11/19/2017   Procedure: LEFT INFERIOR THYROIDECTOMY;  Surgeon: Armandina Gemma, MD;  Location: [facility];  Service: General;  Laterality: Left;   TUBAL LIGATION  1978   bilateral   [place]    There were no vitals filed for this visit.   Subjective Assessment - 06/20/20 1609    Subjective Patient reports that her R shoulder continues to be limited when reaching forward, overhead, and behind her back.    Pertinent History High Cholesterol    Limitations Lifting    Patient Stated Goals Patients goal is to be able to be active with less shoulder pain.    Currently in Pain? No/denies          INTERVENTIONS    Manual Therapy A-P Shoulder Mobilization L UE  Inferior Shoulder Mobilization Long Axis L UE      Therapeutic Exercise  -Towel Pull AAROM for IR bilat.  X10 R UE  -Bilat Shoulder Press 3lbs each arm 2x12 -Shoulder Flexion in Scaption 3lbs 2x15  -Shoulder Abduction 3lbs 2x15  -OH Wall Washing in flexion using Ball 2x45sec bilat. -OMEGA Machine Rows Seated 2x12 15lbs    Performed exercises to improve shoulder strength/stability and ROM   PT Education - 06/20/20 1610    Education Details form/technique for exercises    Person(s) Educated Patient    Methods Explanation;Demonstration    Comprehension Verbalized understanding;Returned demonstration            PT Short Term Goals - 06/20/20 1617      PT SHORT TERM GOAL #1   Title Patient's worst pain will decrease to 5/10NPS after working in the yard for 1 hour.    Baseline 8/10 NPS; 06/20/20: 4/10NPS    Time 3    Period Weeks    Status Achieved    Target Date 06/06/20             PT Long Term Goals - 06/20/20 1618      PT LONG TERM GOAL #1   Title Patients worst pain will decrease to 1/10NPS after working in the yard for 2 or more hours.    Baseline 8/10NPS; 06/20/20: 4/10NPS    Time 6  Period Weeks    Status On-going    Target Date 06/27/20      PT LONG TERM GOAL #2   Title Patients will increase FOTO score to 67 to show an improvement in functional activites.    Baseline 54: 06/20/20: 57    Time 6    Period Weeks    Status New    Target Date 06/27/20      PT LONG TERM GOAL #3   Title Patient will be able to complete household tasks without an increase in pain to show improvement in tasks independence.    Baseline 06/20/20: able to perform household tasks without stopping or having an increase in pain.    Time 6    Period Weeks    Status Achieved      PT LONG TERM GOAL #4   Title Patient will be able to fasten bra behind to show an increase in IR shoulder ROM.    Baseline Has to fasten bra in front    Time 3    Period Weeks    Status New    Target Date 07/11/20                 Plan - 06/20/20 1651    Clinical Impression Statement Patient is making  progress demonstrated by achieving her short-term pain goal of decreasing her worst shoulder pain to 4/10NPS and being able to complete housework without an increase in shoulder symptoms. Patient increased her FOTO score slightly showing a minimal increase in ability to perform functional activities but continues to have trouble with heavy activities. Continued to address R shoulder during todays session to help increase pain free ROM. Patient responded well to long-axis inferior mobilization which was able to increase shoulder IR during session.  Patient will continue to benefit from further skilled therapy to return to prior level of function.    Personal Factors and Comorbidities Comorbidity 1;Age;Fitness    Comorbidities High Cholestorl    Examination-Activity Limitations Reach Overhead;Lift    Examination-Participation Restrictions Yard Work;Cleaning    Stability/Clinical Decision Making Stable/Uncomplicated    Clinical Decision Making Low    Rehab Potential Good    PT Frequency 2x / week    PT Duration 6 weeks    PT Treatment/Interventions ADLs/Self Care Home Management;Electrical Stimulation;Moist Heat;Ultrasound;Traction;Gait training;Functional mobility training;Therapeutic activities;Therapeutic exercise;Balance training;Neuromuscular re-education;Patient/family education;Manual techniques;Passive range of motion;Dry needling;Energy conservation;Spinal Manipulations;Joint Manipulations;Stair training    PT Next Visit Plan strengthening of shoulder complex and OH movements    PT Home Exercise Plan AAROM supine hands clasped shoulder flexion, Scapular retraction, and wall slides, Towel AAROM IR    Consulted and Agree with Plan of Care Patient           Patient will benefit from skilled therapeutic intervention in order to improve the following deficits and impairments:  Decreased balance, Decreased activity tolerance, Decreased endurance, Decreased range of motion, Decreased strength,  Impaired UE functional use, Pain, Impaired flexibility, Decreased mobility  Visit Diagnosis: Acute pain of right shoulder  Acute pain of left shoulder  Stiffness of right shoulder, not elsewhere classified  Stiffness of left shoulder, not elsewhere classified     Problem List Patient Active Problem List   Diagnosis Date Noted   Bilateral shoulder pain 04/03/2020   Abnormal Pap smear of cervix 07/24/2019   Cough 09/05/2018   Low serum vitamin B12 09/02/2018   Hyperparathyroidism, primary ([place]) 11/18/2017   Memory change 10/29/2017   Dysphagia 01/19/2017   Health care maintenance  03/14/2015   Constipation 09/17/2014   Right leg numbness 08/02/2014   Toenail fungus 01/30/2014   Hypercholesterolemia 01/30/2014   Rash 01/30/2014   Breast tenderness 07/25/2013   Sleep apnea 12/15/2012   Mild depression ([place]) 12/15/2012   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 [place], [address] Phone: [phone]   Fax:  [phone]  Name: Ann Osborne MRN: [id number] Date of Birth: 05/05/1951

## 2020-06-26 ENCOUNTER — Ambulatory Visit: Payer: Medicare Other

## 2020-06-26 ENCOUNTER — Other Ambulatory Visit: Payer: Self-pay

## 2020-06-26 DIAGNOSIS — M25512 Pain in left shoulder: Secondary | ICD-10-CM | POA: Diagnosis not present

## 2020-06-26 DIAGNOSIS — M25611 Stiffness of right shoulder, not elsewhere classified: Secondary | ICD-10-CM | POA: Diagnosis not present

## 2020-06-26 DIAGNOSIS — M25511 Pain in right shoulder: Secondary | ICD-10-CM | POA: Diagnosis not present

## 2020-06-26 DIAGNOSIS — M25612 Stiffness of left shoulder, not elsewhere classified: Secondary | ICD-10-CM | POA: Diagnosis not present

## 2020-06-26 NOTE — Therapy (Signed)
[facility] [address] [place], [address] Phone: [phone]   Fax:  [phone]  Physical Therapy Treatment  Patient Details  Name: Ann Osborne MRN: [id number] Date of Birth: [birth year] Referring Provider (PT): [name], MD    Encounter Date: 06/26/2020   PT End of Session - 06/26/20 1450    Visit Number 11    Number of Visits 13    Date for PT Re-Evaluation 06/27/20    Authorization Type 1/ 10    PT Start Time 1430    PT Stop Time 1515    PT Time Calculation (min) 45 min    Activity Tolerance Patient tolerated treatment well;No increased pain    Behavior During Therapy WFL for tasks assessed/performed           Past Medical History:  Diagnosis Date  . ADD (attention deficit disorder)   . Allergy   . Depression   . GERD (gastroesophageal reflux disease)   . Hyperparathyroidism ([place])   . Lyme disease 6-7 YEARS   history    Past Surgical History:  Procedure Laterality Date  . BREAST BIOPSY Left 20 yrs ago  . EYE SURGERY Bilateral 2007 OR 2008   LENS REPLACEMENT   . THYROIDECTOMY Left 11/19/2017   Procedure: LEFT INFERIOR THYROIDECTOMY;  Surgeon: Armandina Gemma, MD;  Location: [facility];  Service: General;  Laterality: Left;  . TUBAL LIGATION  1978   bilateral  . WISDOM TOOTH EXTRACTION  1980    There were no vitals filed for this visit.   Subjective Assessment - 06/26/20 1438    Subjective Patient reports that her shoulder has been feeling "pretty good". Patient states she is having difficulty with placing her R UE behind back .    Pertinent History High Cholesterol    Limitations Lifting    Patient Stated Goals Patients goal is to be able to be active with less shoulder pain.    Currently in Pain? No/denies               TREATMENT Manual Therapy A-P Shoulder Mobilization L UE with patient positioned in supine to decrease pain and spasms grade III - 3 x 30sec STM  performed to ant aspect of the shoulder along the supraspinatus to decreased increased spasms and pain      Therapeutic Exercise  Towel Pull AAROM for IR bilat.-- x20 R UE  Pull behinds with RTB - 2 x 20 Resisted shoulder IR at 90 degrees with RTB - 2 x 20  Self ball mobilization along the ant aspect of the shoulder - 3 min   Performed exercises to improve shoulder strength/stability and ROM    PT Education - 06/26/20 1450    Education Details form/technique with exercise    Person(s) Educated Patient    Methods Explanation;Demonstration    Comprehension Verbalized understanding;Returned demonstration            PT Short Term Goals - 06/20/20 1617      PT SHORT TERM GOAL #1   Title Patient's worst pain will decrease to 5/10NPS after working in the yard for 1 hour.    Baseline 8/10 NPS; 06/20/20: 4/10NPS    Time 3    Period Weeks    Status Achieved    Target Date 06/06/20             PT Long Term Goals - 06/20/20 1618      PT LONG TERM GOAL #1  Title Patients worst pain will decrease to 1/10NPS after working in the yard for 2 or more hours.    Baseline 8/10NPS; 06/20/20: 4/10NPS    Time 6    Period Weeks    Status On-going    Target Date 06/27/20      PT LONG TERM GOAL #2   Title Patients will increase FOTO score to 67 to show an improvement in functional activites.    Baseline 54: 06/20/20: 57    Time 6    Period Weeks    Status New    Target Date 06/27/20      PT LONG TERM GOAL #3   Title Patient will be able to complete household tasks without an increase in pain to show improvement in tasks independence.    Baseline 06/20/20: able to perform household tasks without stopping or having an increase in pain.    Time 6    Period Weeks    Status Achieved      PT LONG TERM GOAL #4   Title Patient will be able to fasten bra behind to show an increase in IR shoulder ROM.    Baseline Has to fasten bra in front    Time 3    Period Weeks    Status New    Target  Date 07/11/20                 Plan - 06/26/20 1543    Clinical Impression Statement Focused on improving functional shoulder IR during today's session. Improvement in motion to T7 following STM and resisted IR in resistance at end of the available AROM. Will continue to focus on improving these limitations throuhgout futrue sessions. Patient will benefit ftom further skilled therapy to return to prior level of function.    Personal Factors and Comorbidities Comorbidity 1;Age;Fitness    Comorbidities High Cholestorl    Examination-Activity Limitations Reach Overhead;Lift    Examination-Participation Restrictions Yard Work;Cleaning    Stability/Clinical Decision Making Stable/Uncomplicated    Rehab Potential Good    PT Frequency 2x / week    PT Duration 6 weeks    PT Treatment/Interventions ADLs/Self Care Home Management;Electrical Stimulation;Moist Heat;Ultrasound;Traction;Gait training;Functional mobility training;Therapeutic activities;Therapeutic exercise;Balance training;Neuromuscular re-education;Patient/family education;Manual techniques;Passive range of motion;Dry needling;Energy conservation;Spinal Manipulations;Joint Manipulations;Stair training    PT Next Visit Plan strengthening of shoulder complex and OH movements    PT Home Exercise Plan AAROM supine hands clasped shoulder flexion, Scapular retraction, and wall slides, Towel AAROM IR    Consulted and Agree with Plan of Care Patient           Patient will benefit from skilled therapeutic intervention in order to improve the following deficits and impairments:  Decreased balance, Decreased activity tolerance, Decreased endurance, Decreased range of motion, Decreased strength, Impaired UE functional use, Pain, Impaired flexibility, Decreased mobility  Visit Diagnosis: Acute pain of right shoulder  Acute pain of left shoulder     Problem List Patient Active Problem List   Diagnosis Date Noted  . Bilateral shoulder  pain 04/03/2020  . Abnormal Pap smear of cervix 07/24/2019  . Cough 09/05/2018  . Low serum vitamin B12 09/02/2018  . Hyperparathyroidism, primary ([place]) 11/18/2017  . Memory change 10/29/2017  . Dysphagia 01/19/2017  . Health care maintenance 03/14/2015  . Constipation 09/17/2014  . Right leg numbness 08/02/2014  . Toenail fungus 01/30/2014  . Hypercholesterolemia 01/30/2014  . Rash 01/30/2014  . Breast tenderness 07/25/2013  . Sleep apnea 12/15/2012  . Mild depression ([place]) 12/15/2012  .  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 Hematuria 12/15/2012    Blythe Stanford, PT DPT 06/26/2020, 5:18 PM  [facility] [address] [place], [address] Phone: [phone]   Fax:  [phone]  Name: Ann Osborne MRN: [id number] Date of Birth: 24-Feb-1951

## 2020-06-28 ENCOUNTER — Ambulatory Visit: Payer: Medicare Other

## 2020-06-28 ENCOUNTER — Other Ambulatory Visit: Payer: Self-pay

## 2020-06-28 DIAGNOSIS — M25512 Pain in left shoulder: Secondary | ICD-10-CM

## 2020-06-28 DIAGNOSIS — M25611 Stiffness of right shoulder, not elsewhere classified: Secondary | ICD-10-CM

## 2020-06-28 DIAGNOSIS — M25612 Stiffness of left shoulder, not elsewhere classified: Secondary | ICD-10-CM

## 2020-06-28 DIAGNOSIS — M25511 Pain in right shoulder: Secondary | ICD-10-CM

## 2020-06-28 NOTE — Therapy (Signed)
[facility] [address], [place], [address] Phone: [phone]   Fax:  [phone]  Physical Therapy Treatment  Patient Details  Name: Ann Osborne MRN: [id number] Date of Birth: Dec 20, 1950 Referring Provider (PT): [name], MD    Encounter Date: 06/28/2020   PT End of Session - 06/28/20 1454    Visit Number 12    Number of Visits 21    Date for PT Re-Evaluation 06/27/20    Authorization Type 1/ 10    PT Start Time 1430    PT Stop Time 1515    PT Time Calculation (min) 45 min    Activity Tolerance Patient tolerated treatment well;No increased pain    Behavior During Therapy WFL for tasks assessed/performed           Past Medical History:  Diagnosis Date  . ADD (attention deficit disorder)   . Allergy   . Depression   . GERD (gastroesophageal reflux disease)   . Hyperparathyroidism ([place])   . Lyme disease 6-7 YEARS   history    Past Surgical History:  Procedure Laterality Date  . BREAST BIOPSY Left 20 yrs ago  . EYE SURGERY Bilateral 2007 OR 2008   LENS REPLACEMENT   . THYROIDECTOMY Left 11/19/2017   Procedure: LEFT INFERIOR THYROIDECTOMY;  Surgeon: Armandina Gemma, MD;  Location: [facility];  Service: General;  Laterality: Left;  . TUBAL LIGATION  1978   bilateral  . WISDOM TOOTH EXTRACTION  1980    There were no vitals filed for this visit.   Subjective Assessment - 06/28/20 1437    Subjective Patient reports her shoulder has been improving overall. Patient states she continues have increased pain with reaching behind her head.    Pertinent History High Cholesterol    Limitations Lifting    Patient Stated Goals Patients goal is to be able to be active with less shoulder pain.    Currently in Pain? No/denies               TREATMENT Manual Therapy STM performed to lateral aspect of the shoulder along the proximal attachment to decreased increased spasms and pain       Therapeutic Exercise  Towel Pull AAROM for IR bilat.-- 2x20 R UE  Hands behind head ER - 2 x 15 Shoulder 90-90 ER with back against wall - 2 x 10  Supine 90-90 ER in prone - x 20 Shoulder flexion in standing against wall - x10 3#; x 10 2#, x 10 no weight Shoulder lateral rows in standing - x 20  Shoulder " No money " - x 10 RTB    Performed exercises to improve shoulder strength/stability and ROM    PT Education - 06/28/20 1444    Education Details form/technique with exercise    Person(s) Educated Patient    Methods Explanation;Demonstration    Comprehension Verbalized understanding;Returned demonstration            PT Short Term Goals - 06/20/20 1617      PT SHORT TERM GOAL #1   Title Patient's worst pain will decrease to 5/10NPS after working in the yard for 1 hour.    Baseline 8/10 NPS; 06/20/20: 4/10NPS    Time 3    Period Weeks    Status Achieved    Target Date 06/06/20             PT Long Term Goals - 06/20/20 1618  PT LONG TERM GOAL #1   Title Patients worst pain will decrease to 1/10NPS after working in the yard for 2 or more hours.    Baseline 8/10NPS; 06/20/20: 4/10NPS    Time 6    Period Weeks    Status On-going    Target Date 06/27/20      PT LONG TERM GOAL #2   Title Patients will increase FOTO score to 67 to show an improvement in functional activites.    Baseline 54: 06/20/20: 57    Time 6    Period Weeks    Status New    Target Date 06/27/20      PT LONG TERM GOAL #3   Title Patient will be able to complete household tasks without an increase in pain to show improvement in tasks independence.    Baseline 06/20/20: able to perform household tasks without stopping or having an increase in pain.    Time 6    Period Weeks    Status Achieved      PT LONG TERM GOAL #4   Title Patient will be able to fasten bra behind to show an increase in IR shoulder ROM.    Baseline Has to fasten bra in front    Time 3    Period Weeks    Status New     Target Date 07/11/20                 Plan - 06/28/20 1512    Clinical Impression Statement Great carryover with Shoulder IR pulls behind back, with ability to raise arm to the same level (T7) at the end of the previous session. Focused on improving reaching behind head for functional scratching; able to perform with greater ease and AROM after STM manual therapy to middle delt, indicating dyfunction. Patient given HEP to reinforce functional gains from today's session. Patient will benefit from further skilled therapy focused on improving limitations to return to prior level of function.    Personal Factors and Comorbidities Comorbidity 1;Age;Fitness    Comorbidities High Cholestorl    Examination-Activity Limitations Reach Overhead;Lift    Examination-Participation Restrictions Yard Work;Cleaning    Stability/Clinical Decision Making Stable/Uncomplicated    Rehab Potential Good    PT Frequency 2x / week    PT Duration 6 weeks    PT Treatment/Interventions ADLs/Self Care Home Management;Electrical Stimulation;Moist Heat;Ultrasound;Traction;Gait training;Functional mobility training;Therapeutic activities;Therapeutic exercise;Balance training;Neuromuscular re-education;Patient/family education;Manual techniques;Passive range of motion;Dry needling;Energy conservation;Spinal Manipulations;Joint Manipulations;Stair training    PT Next Visit Plan strengthening of shoulder complex and OH movements    PT Home Exercise Plan AAROM supine hands clasped shoulder flexion, Scapular retraction, and wall slides, Towel AAROM IR    Consulted and Agree with Plan of Care Patient           Patient will benefit from skilled therapeutic intervention in order to improve the following deficits and impairments:  Decreased balance, Decreased activity tolerance, Decreased endurance, Decreased range of motion, Decreased strength, Impaired UE functional use, Pain, Impaired flexibility, Decreased mobility  Visit  Diagnosis: Acute pain of right shoulder  Acute pain of left shoulder  Stiffness of right shoulder, not elsewhere classified  Stiffness of left shoulder, not elsewhere classified     Problem List Patient Active Problem List   Diagnosis Date Noted  . Bilateral shoulder pain 04/03/2020  . Abnormal Pap smear of cervix 07/24/2019  . Cough 09/05/2018  . Low serum vitamin B12 09/02/2018  . Hyperparathyroidism, primary ([place]) 11/18/2017  . Memory change  10/29/2017  . Dysphagia 01/19/2017  . Health care maintenance 03/14/2015  . Constipation 09/17/2014  . Right leg numbness 08/02/2014  . Toenail fungus 01/30/2014  . Hypercholesterolemia 01/30/2014  . Rash 01/30/2014  . Breast tenderness 07/25/2013  . Sleep apnea 12/15/2012  . Mild depression ([place]) 12/15/2012  . 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 Hematuria 12/15/2012    Blythe Stanford, PT DPT 06/28/2020, 3:15 PM  [facility] [address], [place], [address] Phone: [phone]   Fax:  [phone]  Name: Ann Osborne MRN: [id number] Date of Birth: [birth year]

## 2020-07-10 ENCOUNTER — Other Ambulatory Visit: Payer: Self-pay

## 2020-07-10 ENCOUNTER — Ambulatory Visit: Payer: Medicare Other

## 2020-07-10 DIAGNOSIS — M25611 Stiffness of right shoulder, not elsewhere classified: Secondary | ICD-10-CM | POA: Diagnosis not present

## 2020-07-10 DIAGNOSIS — M25512 Pain in left shoulder: Secondary | ICD-10-CM

## 2020-07-10 DIAGNOSIS — M25612 Stiffness of left shoulder, not elsewhere classified: Secondary | ICD-10-CM

## 2020-07-10 DIAGNOSIS — M25511 Pain in right shoulder: Secondary | ICD-10-CM | POA: Diagnosis not present

## 2020-07-10 NOTE — Therapy (Signed)
[facility] [address], [place], [address] Phone: [phone]   Fax:  [phone]  Physical Therapy Treatment  Patient Details  Name: Ann Osborne MRN: [id number] Date of Birth: [birth year] Referring Provider (PT): [name], MD    Encounter Date: 07/10/2020   PT End of Session - 07/10/20 1436    Visit Number 13    Number of Visits 21    Date for PT Re-Evaluation 06/27/20    Authorization Type 2/ 10    PT Start Time 1430    PT Stop Time 1515    PT Time Calculation (min) 45 min    Activity Tolerance Patient tolerated treatment well;No increased pain    Behavior During Therapy WFL for tasks assessed/performed           Past Medical History:  Diagnosis Date   ADD (attention deficit disorder)    Allergy    Depression    GERD (gastroesophageal reflux disease)    Hyperparathyroidism ([place])    Lyme disease 6-7 YEARS   history    Past Surgical History:  Procedure Laterality Date   BREAST BIOPSY Left 20 yrs ago   EYE SURGERY Bilateral 2007 OR 2008   LENS REPLACEMENT    THYROIDECTOMY Left 11/19/2017   Procedure: LEFT INFERIOR THYROIDECTOMY;  Surgeon: Armandina Gemma, MD;  Location: [facility];  Service: General;  Laterality: Left;   TUBAL LIGATION  1978   bilateral   [place]    There were no vitals filed for this visit.   Subjective Assessment - 07/10/20 1434    Subjective Patient reports that her shoulder is doing great but continues to be limited in IR.    Pertinent History High Cholesterol    Limitations Lifting    Patient Stated Goals Patients goal is to be able to be active with less shoulder pain.    Currently in Pain? No/denies          TREATMENT  Manual Therapy STM performed to lateral aspect of the shoulder along the proximal attachment to decreased increased spasms and pain  STM to R Pectoral major near insertion  A-P mobilization to R Shoulder       Therapeutic Exercise  Towel Pull AAROM for IR bilat.-- 2x20 R UE  Hands behind head ER - 2 x 15 Shoulder 90-90 ER with back against wall - 2 x 10  TRX High Rows 2x10  TRX Shoulder Flexion x10  Shoulder No money - x 10 RTB    Performed exercises to improve shoulder strength/stability and ROM    PT Education - 07/10/20 1436    Education Details form/technique with exercise    Person(s) Educated Patient    Methods Explanation;Demonstration    Comprehension Verbalized understanding;Returned demonstration            PT Short Term Goals - 06/20/20 1617      PT SHORT TERM GOAL #1   Title Patient's worst pain will decrease to 5/10NPS after working in the yard for 1 hour.    Baseline 8/10 NPS; 06/20/20: 4/10NPS    Time 3    Period Weeks    Status Achieved    Target Date 06/06/20             PT Long Term Goals - 06/20/20 1618      PT LONG TERM GOAL #1   Title Patients worst pain will decrease to 1/10NPS after working in the yard  for 2 or more hours.    Baseline 8/10NPS; 06/20/20: 4/10NPS    Time 6    Period Weeks    Status On-going    Target Date 06/27/20      PT LONG TERM GOAL #2   Title Patients will increase FOTO score to 67 to show an improvement in functional activites.    Baseline 54: 06/20/20: 57    Time 6    Period Weeks    Status New    Target Date 06/27/20      PT LONG TERM GOAL #3   Title Patient will be able to complete household tasks without an increase in pain to show improvement in tasks independence.    Baseline 06/20/20: able to perform household tasks without stopping or having an increase in pain.    Time 6    Period Weeks    Status Achieved      PT LONG TERM GOAL #4   Title Patient will be able to fasten bra behind to show an increase in IR shoulder ROM.    Baseline Has to fasten bra in front    Time 3    Period Weeks    Status New    Target Date 07/11/20                 Plan - 07/10/20 1506    Clinical Impression  Statement Patient IR continues to improve, and patient has been able to see great improve in functional tasks throughout the day.  Patients strength and pain-free ROM are increasing demonstrated by patients ability to perform exercises will more resistance through a greater range with minimal pain. Patient will benefit from further skilled therapy focused on improving limitations to return to prior level of function.    Personal Factors and Comorbidities Comorbidity 1;Age;Fitness    Comorbidities High Cholestorl    Examination-Activity Limitations Reach Overhead;Lift    Examination-Participation Restrictions Yard Work;Cleaning    Stability/Clinical Decision Making Stable/Uncomplicated    Clinical Decision Making Low    Rehab Potential Good    PT Frequency 2x / week    PT Duration 6 weeks    PT Treatment/Interventions ADLs/Self Care Home Management;Electrical Stimulation;Moist Heat;Ultrasound;Traction;Gait training;Functional mobility training;Therapeutic activities;Therapeutic exercise;Balance training;Neuromuscular re-education;Patient/family education;Manual techniques;Passive range of motion;Dry needling;Energy conservation;Spinal Manipulations;Joint Manipulations;Stair training    PT Next Visit Plan strengthening of shoulder complex and OH movements    PT Home Exercise Plan AAROM supine hands clasped shoulder flexion, Scapular retraction, and wall slides, Towel AAROM IR    Consulted and Agree with Plan of Care Patient           Patient will benefit from skilled therapeutic intervention in order to improve the following deficits and impairments:  Decreased balance, Decreased activity tolerance, Decreased endurance, Decreased range of motion, Decreased strength, Impaired UE functional use, Pain, Impaired flexibility, Decreased mobility  Visit Diagnosis: Acute pain of right shoulder  Acute pain of left shoulder  Stiffness of right shoulder, not elsewhere classified  Stiffness of left  shoulder, not elsewhere classified     Problem List Patient Active Problem List   Diagnosis Date Noted   Bilateral shoulder pain 04/03/2020   Abnormal Pap smear of cervix 07/24/2019   Cough 09/05/2018   Low serum vitamin B12 09/02/2018   Hyperparathyroidism, primary ([place]) 11/18/2017   Memory change 10/29/2017   Dysphagia 01/19/2017   Health care maintenance 03/14/2015   Constipation 09/17/2014   Right leg numbness 08/02/2014   Toenail fungus 01/30/2014   Hypercholesterolemia  01/30/2014   Rash 01/30/2014   Breast tenderness 07/25/2013   Sleep apnea 12/15/2012   Mild depression ([place]) 12/15/2012   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 [place], [address] Phone: [phone]   Fax:  [phone]  Name: MARAH PARK MRN: [id number] Date of Birth: 06-16-1951

## 2020-07-12 ENCOUNTER — Ambulatory Visit: Payer: Medicare Other | Attending: Internal Medicine

## 2020-07-12 ENCOUNTER — Other Ambulatory Visit: Payer: Self-pay

## 2020-07-12 DIAGNOSIS — M25511 Pain in right shoulder: Secondary | ICD-10-CM | POA: Insufficient documentation

## 2020-07-12 DIAGNOSIS — M25612 Stiffness of left shoulder, not elsewhere classified: Secondary | ICD-10-CM

## 2020-07-12 DIAGNOSIS — M25611 Stiffness of right shoulder, not elsewhere classified: Secondary | ICD-10-CM

## 2020-07-12 DIAGNOSIS — M25512 Pain in left shoulder: Secondary | ICD-10-CM

## 2020-07-12 NOTE — Therapy (Signed)
[facility] [address], [place], [address] Phone: [phone]   Fax:  [phone]  Physical Therapy Treatment  Patient Details  Name: Ann Osborne MRN: [id number] Date of Birth: 12/22/1950 Referring Provider (PT): [name], MD    Encounter Date: 07/12/2020   PT End of Session - 07/12/20 1435    Visit Number 14    Number of Visits 21    Date for PT Re-Evaluation 07/18/20    Authorization Type 3/ 10    PT Start Time 1430    PT Stop Time 1510    PT Time Calculation (min) 40 min    Activity Tolerance Patient tolerated treatment well;No increased pain    Behavior During Therapy WFL for tasks assessed/performed           Past Medical History:  Diagnosis Date  . ADD (attention deficit disorder)   . Allergy   . Depression   . GERD (gastroesophageal reflux disease)   . Hyperparathyroidism ([place])   . Lyme disease 6-7 YEARS   history    Past Surgical History:  Procedure Laterality Date  . BREAST BIOPSY Left 20 yrs ago  . EYE SURGERY Bilateral 2007 OR 2008   LENS REPLACEMENT   . THYROIDECTOMY Left 11/19/2017   Procedure: LEFT INFERIOR THYROIDECTOMY;  Surgeon: Armandina Gemma, MD;  Location: [facility];  Service: General;  Laterality: Left;  . TUBAL LIGATION  1978   bilateral  . WISDOM TOOTH EXTRACTION  1980    There were no vitals filed for this visit.   Subjective Assessment - 07/12/20 1431    Subjective Patient reports that her shoulders are doing fine and has not been painful.    Pertinent History High Cholesterol    Limitations Lifting    Patient Stated Goals Patients goal is to be able to be active with less shoulder pain.    Currently in Pain? No/denies           TREATMENT   Manual Therapy STM performed to lateral aspect of the shoulder along the proximal attachment to decreased increased spasms and pain  STM to R Pectoral major near insertion  A-P mobilization to R Shoulder       Therapeutic Exercise  Shoulder Press 2x15 3# Shoulder Abduction 2x15 3# Shoulder "No money "- 2 x 15 RTB  Doorway Pec Stretch 2x30sec  TRX High Rows 2x10  Shoulder Flexion with RTB 2x15 Pulls downs with RTB x15 BTB Rows x15      Performed exercises to improve shoulder strength/stability and ROM       PT Education - 07/12/20 1434    Education Details form/technique with exercise    Person(s) Educated Patient    Methods Explanation;Demonstration    Comprehension Verbalized understanding;Returned demonstration            PT Short Term Goals - 06/20/20 1617      PT SHORT TERM GOAL #1   Title Patient's worst pain will decrease to 5/10NPS after working in the yard for 1 hour.    Baseline 8/10 NPS; 06/20/20: 4/10NPS    Time 3    Period Weeks    Status Achieved    Target Date 06/06/20             PT Long Term Goals - 06/20/20 1618      PT LONG TERM GOAL #1   Title Patients worst pain will decrease to 1/10NPS after working in the yard for 2  or more hours.    Baseline 8/10NPS; 06/20/20: 4/10NPS    Time 6    Period Weeks    Status On-going    Target Date 06/27/20      PT LONG TERM GOAL #2   Title Patients will increase FOTO score to 67 to show an improvement in functional activites.    Baseline 54: 06/20/20: 57    Time 6    Period Weeks    Status New    Target Date 06/27/20      PT LONG TERM GOAL #3   Title Patient will be able to complete household tasks without an increase in pain to show improvement in tasks independence.    Baseline 06/20/20: able to perform household tasks without stopping or having an increase in pain.    Time 6    Period Weeks    Status Achieved      PT LONG TERM GOAL #4   Title Patient will be able to fasten bra behind to show an increase in IR shoulder ROM.    Baseline Has to fasten bra in front    Time 3    Period Weeks    Status New    Target Date 07/11/20                 Plan - 07/12/20 1459    Clinical Impression  Statement Focused on technique and form of exercises to ensure patient performs exercises correctly for progressive HEP.  Patient required minimal verbal and tactile cues to do so. Patient instructed to continue exercises for the next 4 weeks until next appointment to check in to see how HEP is going and if any symptoms have occurred or worsened.  Patient in agreement with current POC.    Personal Factors and Comorbidities Comorbidity 1;Age;Fitness    Comorbidities High Cholestorl    Examination-Activity Limitations Reach Overhead;Lift    Examination-Participation Restrictions Yard Work;Cleaning    Stability/Clinical Decision Making Stable/Uncomplicated    Clinical Decision Making Low    Rehab Potential Good    PT Frequency 2x / week    PT Duration 6 weeks    PT Treatment/Interventions ADLs/Self Care Home Management;Electrical Stimulation;Moist Heat;Ultrasound;Traction;Gait training;Functional mobility training;Therapeutic activities;Therapeutic exercise;Balance training;Neuromuscular re-education;Patient/family education;Manual techniques;Passive range of motion;Dry needling;Energy conservation;Spinal Manipulations;Joint Manipulations;Stair training    PT Next Visit Plan Check to see how HEP is going and D/C if able.    PT Home Exercise Plan AAROM supine hands clasped shoulder flexion, Scapular retraction, and wall slides, Towel AAROM IR    Consulted and Agree with Plan of Care Patient           Patient will benefit from skilled therapeutic intervention in order to improve the following deficits and impairments:  Decreased balance, Decreased activity tolerance, Decreased endurance, Decreased range of motion, Decreased strength, Impaired UE functional use, Pain, Impaired flexibility, Decreased mobility  Visit Diagnosis: Acute pain of right shoulder  Acute pain of left shoulder  Stiffness of right shoulder, not elsewhere classified  Stiffness of left shoulder, not elsewhere  classified     Problem List Patient Active Problem List   Diagnosis Date Noted  . Bilateral shoulder pain 04/03/2020  . Abnormal Pap smear of cervix 07/24/2019  . Cough 09/05/2018  . Low serum vitamin B12 09/02/2018  . Hyperparathyroidism, primary ([place]) 11/18/2017  . Memory change 10/29/2017  . Dysphagia 01/19/2017  . Health care maintenance 03/14/2015  . Constipation 09/17/2014  . Right leg numbness 08/02/2014  . Toenail fungus 01/30/2014  .  Hypercholesterolemia 01/30/2014  . Rash 01/30/2014  . Breast tenderness 07/25/2013  . Sleep apnea 12/15/2012  . Mild depression ([place]) 12/15/2012  . 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 Hematuria 12/15/2012   3:09 PM, 07/12/20 Margarito Liner, SPT Student Physical Therapist [place]  [phone]  Margarito Liner 07/12/2020, 3:08 PM  [facility] [address] [place], [address] Phone: [phone]   Fax:  [phone]  Name: Ann Osborne MRN: [id number] Date of Birth: November 12, 1950

## 2020-07-27 ENCOUNTER — Telehealth: Payer: Self-pay | Admitting: Internal Medicine

## 2020-07-27 DIAGNOSIS — K5900 Constipation, unspecified: Secondary | ICD-10-CM

## 2020-07-27 DIAGNOSIS — E7800 Pure hypercholesterolemia, unspecified: Secondary | ICD-10-CM

## 2020-07-27 NOTE — Telephone Encounter (Signed)
Pt is experiencing INcreased cholestorol problems, Constipation, Fatigue, Mental function and confusion when driving, Puffiness in her face,  Called and spoke to [place]. She states that she is having issues with her increasing cholesterol, Constipation, Fatigue, Mental Function issues and confusion when doing daily tasks like driving, and Puffiness in her face. Iceis states that she saw that this all relates to Hypothyroidism and is worried about her thyroid.

## 2020-07-27 NOTE — Telephone Encounter (Signed)
I am ok to check her thyroid, but what symptoms is she having?  Any acute symptoms?

## 2020-07-27 NOTE — Telephone Encounter (Signed)
Patient would like to have her thyroid check. She says she is having symptoms.

## 2020-07-28 NOTE — Telephone Encounter (Signed)
Reviewed message. Sent pt a my chart message.  I do not mind ordering lab, but concern regarding confusion, etc.  Sounds like she needs to be evaluated.  Ok to schedule lab appt.  Would like for it to be a fasting lab to recheck cholesterol, etc.

## 2020-07-30 NOTE — Telephone Encounter (Signed)
Patient scheduled for fasting labs 9/21 at 8:30 am. Patient informed and verbalized understanding.

## 2020-07-31 ENCOUNTER — Other Ambulatory Visit: Payer: Medicare Other

## 2020-08-01 ENCOUNTER — Other Ambulatory Visit: Payer: Self-pay

## 2020-08-01 ENCOUNTER — Other Ambulatory Visit (INDEPENDENT_AMBULATORY_CARE_PROVIDER_SITE_OTHER): Payer: Medicare Other

## 2020-08-01 DIAGNOSIS — K5900 Constipation, unspecified: Secondary | ICD-10-CM | POA: Diagnosis not present

## 2020-08-01 DIAGNOSIS — E7800 Pure hypercholesterolemia, unspecified: Secondary | ICD-10-CM | POA: Diagnosis not present

## 2020-08-01 LAB — COMPREHENSIVE METABOLIC PANEL
ALT: 21 U/L (ref 0–35)
AST: 22 U/L (ref 0–37)
Albumin: 4.3 g/dL (ref 3.5–5.2)
Alkaline Phosphatase: 66 U/L (ref 39–117)
BUN: 13 mg/dL (ref 6–23)
CO2: 30 mEq/L (ref 19–32)
Calcium: 9 mg/dL (ref 8.4–10.5)
Chloride: 104 mEq/L (ref 96–112)
Creatinine, Ser: 0.88 mg/dL (ref 0.40–1.20)
GFR: 63.24 mL/min (ref >60.00)
Glucose, Bld: 101 mg/dL — ABNORMAL HIGH (ref 70–99)
Potassium: 4.3 mEq/L (ref 3.5–5.1)
Sodium: 139 mEq/L (ref 135–145)
Total Bilirubin: 0.5 mg/dL (ref 0.2–1.2)
Total Protein: 7.5 g/dL (ref 6.0–8.3)

## 2020-08-01 LAB — LIPID PANEL
Cholesterol: 183 mg/dL (ref 0–200)
HDL: 36.9 mg/dL — ABNORMAL LOW (ref >39.00)
LDL Cholesterol: 119 mg/dL — ABNORMAL HIGH (ref 0–99)
NonHDL: 146.47
Total CHOL/HDL Ratio: 5
Triglycerides: 136 mg/dL (ref 0.0–149.0)
VLDL: 27.2 mg/dL (ref 0.0–40.0)

## 2020-08-01 LAB — TSH: TSH: 3.19 u[IU]/mL (ref 0.35–4.50)

## 2020-08-02 DIAGNOSIS — H353131 Nonexudative age-related macular degeneration, bilateral, early dry stage: Secondary | ICD-10-CM | POA: Diagnosis not present

## 2020-08-07 ENCOUNTER — Ambulatory Visit (INDEPENDENT_AMBULATORY_CARE_PROVIDER_SITE_OTHER): Payer: Medicare Other | Admitting: Internal Medicine

## 2020-08-07 ENCOUNTER — Other Ambulatory Visit: Payer: Self-pay

## 2020-08-07 ENCOUNTER — Encounter: Payer: Self-pay | Admitting: Internal Medicine

## 2020-08-07 DIAGNOSIS — Z23 Encounter for immunization: Secondary | ICD-10-CM

## 2020-08-07 DIAGNOSIS — E210 Primary hyperparathyroidism: Secondary | ICD-10-CM | POA: Diagnosis not present

## 2020-08-07 DIAGNOSIS — F320 Major depressive disorder, single episode, mild: Secondary | ICD-10-CM

## 2020-08-07 DIAGNOSIS — G4730 Sleep apnea, unspecified: Secondary | ICD-10-CM

## 2020-08-07 DIAGNOSIS — E2839 Other primary ovarian failure: Secondary | ICD-10-CM | POA: Diagnosis not present

## 2020-08-07 DIAGNOSIS — K5900 Constipation, unspecified: Secondary | ICD-10-CM | POA: Diagnosis not present

## 2020-08-07 DIAGNOSIS — S92901D Unspecified fracture of right foot, subsequent encounter for fracture with routine healing: Secondary | ICD-10-CM | POA: Diagnosis not present

## 2020-08-07 DIAGNOSIS — F32A Depression, unspecified: Secondary | ICD-10-CM

## 2020-08-07 DIAGNOSIS — R87619 Unspecified abnormal cytological findings in specimens from cervix uteri: Secondary | ICD-10-CM

## 2020-08-07 DIAGNOSIS — E7800 Pure hypercholesterolemia, unspecified: Secondary | ICD-10-CM

## 2020-08-07 NOTE — Assessment & Plan Note (Addendum)
The 10-year ASCVD risk score Mikey Bussing DC Brooke Bonito., et al., 2013) is: 9.4%   Values used to calculate the score:     Age: 69 years     Sex: Female     Is Non-Hispanic African American: No     Diabetic: No     Tobacco smoker: No     Systolic Blood Pressure: 437 mmHg     Is BP treated: No     HDL Cholesterol: 36.9 mg/dL     Total Cholesterol: 183 mg/dL   Discussed calculated cholesterol risk.  Has adjusted her diet.  Lost weight.  Wants to hold on medication. Follow lipid panel.

## 2020-08-07 NOTE — Progress Notes (Signed)
[name], female   DOB: 19-Jul-1951, 69 y.o.   MRN: [id number]   Subjective:    [name], female    DOB: [birth year], 69 y.o.   MRN: [id number]  HPI This visit occurred during the SARS-CoV-2 public health emergency.  Safety protocols were in place, including screening questions prior to the visit, additional usage of staff PPE, and extensive cleaning of exam room while observing appropriate contact time as indicated for disinfecting solutions.  Patient here for a scheduled follow up.  She reports she is doing better.  Feels better.  Increased stress with family situation, husband, etc.  She has essentially removed herself from that situation.  Things are better.  Trying to stay active.  Has adjusted her diet.  Lost weight.  Has cut out sugars.  Feels better. No chest pain or sob reported. No abdominal pain.  Some constipation.  Also problems with her right foot.  Three fractures.  Seeing Dr Vickki Muff.  S/p cortisone injection.  Still pain and burning sensation in her ankle.  Going to PT.  Discussed labs.     Past Medical History:  Diagnosis Date  . ADD (attention deficit disorder)   . Allergy   . Depression   . GERD (gastroesophageal reflux disease)   . Hyperparathyroidism ([place])   . Lyme disease 6-7 YEARS   history   Past Surgical History:  Procedure Laterality Date  . BREAST BIOPSY Left 20 yrs ago  . EYE SURGERY Bilateral 2007 OR 2008   LENS REPLACEMENT   . THYROIDECTOMY Left 11/19/2017   Procedure: LEFT INFERIOR THYROIDECTOMY;  Surgeon: Armandina Gemma, MD;  Location: [facility];  Service: General;  Laterality: Left;  . TUBAL LIGATION  1978   bilateral  . WISDOM TOOTH EXTRACTION  1980   Family History  Problem Relation Age of Onset  . Uterine cancer Mother   . Heart disease Maternal Grandmother   . Alcohol abuse Maternal Grandfather   . Parkinson's disease Paternal Grandmother   . Depression Paternal Grandfather   . Breast cancer Neg Hx    Social  History   Socioeconomic History  . Marital status: Married    Spouse name: Not on file  . Number of children: 2  . Years of education: Not on file  . Highest education level: Not on file  Occupational History  . Not on file  Tobacco Use  . Smoking status: Never Smoker  . Smokeless tobacco: Never Used  Vaping Use  . Vaping Use: Never used  Substance and Sexual Activity  . Alcohol use: Yes    Alcohol/week: 0.0 standard drinks    Comment: occasional  . Drug use: No  . Sexual activity: Not Currently  Other Topics Concern  . Not on file  Social History Narrative  . Not on file   Social Determinants of Health   Financial Resource Strain:   . Difficulty of Paying Living Expenses: Not on file  Food Insecurity:   . Worried About Charity fundraiser in the Last Year: Not on file  . Ran Out of Food in the Last Year: Not on file  Transportation Needs:   . Lack of Transportation (Medical): Not on file  . Lack of Transportation (Non-Medical): Not on file  Physical Activity:   . Days of Exercise per Week: Not on file  . Minutes of Exercise per Session: Not on file  Stress:   . Feeling of Stress : Not on file  Social Connections:   .  Frequency of Communication with Friends and Family: Not on file  . Frequency of Social Gatherings with Friends and Family: Not on file  . Attends Religious Services: Not on file  . Active Member of Clubs or Organizations: Not on file  . Attends Archivist Meetings: Not on file  . Marital Status: Not on file    Outpatient Encounter Medications as of 08/07/2020  Medication Sig  . Alpha-Lipoic Acid 50 MG TABS Take by mouth daily.  . Multiple Vitamins-Minerals (ICAPS AREDS 2 PO) Take by mouth 2 (two) times daily.  . Multiple Vitamins-Minerals (MEGA MULTIVITAMIN FOR WOMEN PO) Take by mouth daily.  Marland Kitchen omeprazole (PRILOSEC) 20 MG capsule Take 1 capsule (20 mg total) by mouth 2 (two) times daily before a meal.  . TURMERIC PO Take 1,000 mg by mouth  daily.  Marland Kitchen venlafaxine XR (EFFEXOR XR) 75 MG 24 hr capsule Take 1 capsule (75 mg total) by mouth daily.   No facility-administered encounter medications on file as of 08/07/2020.    Review of Systems  Constitutional: Negative for appetite change and unexpected weight change.  HENT: Negative for congestion and sinus pressure.   Respiratory: Negative for cough, chest tightness and shortness of breath.   Cardiovascular: Negative for chest pain, palpitations and leg swelling.  Gastrointestinal: Positive for constipation. Negative for abdominal pain, diarrhea, nausea and vomiting.  Genitourinary: Negative for difficulty urinating and dysuria.  Musculoskeletal: Negative for joint swelling and myalgias.       Right foot pain as outlined.   Skin: Negative for color change and rash.  Neurological: Negative for dizziness, light-headedness and headaches.  Psychiatric/Behavioral: Negative for agitation and dysphoric mood.       Objective:    Physical Exam Vitals reviewed.  Constitutional:      General: She is not in acute distress.    Appearance: Normal appearance.  HENT:     Head: Normocephalic and atraumatic.     Right Ear: External ear normal.     Left Ear: External ear normal.  Eyes:     General: No scleral icterus.       Right eye: No discharge.        Left eye: No discharge.     Conjunctiva/sclera: Conjunctivae normal.  Neck:     Thyroid: No thyromegaly.  Cardiovascular:     Rate and Rhythm: Normal rate and regular rhythm.  Pulmonary:     Effort: No respiratory distress.     Breath sounds: Normal breath sounds. No wheezing.  Abdominal:     General: Bowel sounds are normal.     Palpations: Abdomen is soft.     Tenderness: There is no abdominal tenderness.  Musculoskeletal:        General: No swelling or tenderness.     Cervical back: Neck supple. No tenderness.  Lymphadenopathy:     Cervical: No cervical adenopathy.  Skin:    Findings: No erythema or rash.  Neurological:      Mental Status: She is alert.  Psychiatric:        Mood and Affect: Mood normal.        Behavior: Behavior normal.     BP 118/70   Pulse 74   Temp 98.6 F (37 C) (Oral)   Resp 16   Ht 5\' 7"  (1.702 m)   Wt 175 lb 12.8 oz (79.7 kg)   LMP 07/16/1995 (LMP Unknown)   SpO2 98%   BMI 27.53 kg/m  Wt Readings from Last 3 Encounters:  08/07/20 175 lb 12.8 oz (79.7 kg)  04/03/20 179 lb 6.4 oz (81.4 kg)  04/03/20 180 lb (81.6 kg)     Lab Results  Component Value Date   WBC 4.5 04/03/2020   HGB 13.1 04/03/2020   HCT 38.1 04/03/2020   PLT 258.0 04/03/2020   GLUCOSE 101 (H) 08/01/2020   CHOL 183 08/01/2020   TRIG 136.0 08/01/2020   HDL 36.90 (L) 08/01/2020   LDLDIRECT 151.0 04/03/2020   LDLCALC 119 (H) 08/01/2020   ALT 21 08/01/2020   AST 22 08/01/2020   NA 139 08/01/2020   K 4.3 08/01/2020   CL 104 08/01/2020   CREATININE 0.88 08/01/2020   BUN 13 08/01/2020   CO2 30 08/01/2020   TSH 3.19 08/01/2020    MM DIAG BREAST TOMO BILATERAL  Result Date: 05/17/2020 CLINICAL DATA:  Short-term follow-up for a probably benign mass in the left breast. This was initially detected on a screening mammogram on 04/06/2019, assessed with diagnostic mammography and ultrasound on 04/21/2019. This appeared as a complicated cyst on ultrasound measuring 7 x 4 x 4 mm. Follow-up ultrasound on 10/27/2019 demonstrated a decrease in size of this cyst to 4 x 4 x 2 mm. EXAM: DIGITAL DIAGNOSTIC BILATERAL MAMMOGRAM WITH TOMO AND CAD COMPARISON:  Prior exams ACR Breast Density Category b: There are scattered areas of fibroglandular density. FINDINGS: The mass seen on the screening study dated 04/06/2019 has resolved. There are no new masses, no areas of architectural distortion, no significant asymmetries and no suspicious calcifications. Mammographic images were processed with CAD. IMPRESSION: 1. No evidence of breast malignancy. The previously seen left breast complicated cyst/cluster of cysts has resolved.  RECOMMENDATION: Screening mammogram in one year.(Code:SM-B-01Y) I have discussed the findings and recommendations with the patient. If applicable, a reminder letter will be sent to the patient regarding the next appointment. BI-RADS CATEGORY  1: Negative. Electronically Signed   By: Lajean Manes M.D.   On: 05/17/2020 14:16       Assessment & Plan:   Problem List Items Addressed This Visit    Sleep apnea    Continue cpap.       Mild depression ([place])    On effexor.  Doing better.  Has good support.  Follow.       Hyperparathyroidism, primary [facility])    S/p left parathyroidectomy - Dr Harlow Asa.  Follow calcium.       Relevant Orders   Phosphorus   Hypercholesterolemia    The 10-year ASCVD risk score Mikey Bussing DC Jr., et al., 2013) is: 9.4%   Values used to calculate the score:     Age: 13 years     Sex: Female     Is Non-Hispanic African American: No     Diabetic: No     Tobacco smoker: No     Systolic Blood Pressure: 462 mmHg     Is BP treated: No     HDL Cholesterol: 36.9 mg/dL     Total Cholesterol: 183 mg/dL   Discussed calculated cholesterol risk.  Has adjusted her diet.  Lost weight.  Wants to hold on medication. Follow lipid panel.       Relevant Orders   Comprehensive metabolic panel   Lipid panel   Foot fracture, right    Being followed by podiatry.  Schedule bone density.       Constipation    Feel this is aggravated by changing her diet. Discussed treatment - miralax.  Follow.       Abnormal  Pap smear of cervix    Positive HPV.  Seeing Dr Leafy Ro.        Other Visit Diagnoses    Need for immunization against influenza       Relevant Orders   Flu Vaccine QUAD High Dose(Fluad) (Completed)   Estrogen deficiency       Relevant Orders   DG Bone Density       [name], MD

## 2020-08-14 ENCOUNTER — Ambulatory Visit: Payer: Medicare Other | Attending: Internal Medicine

## 2020-08-14 ENCOUNTER — Other Ambulatory Visit: Payer: Self-pay

## 2020-08-14 DIAGNOSIS — M25511 Pain in right shoulder: Secondary | ICD-10-CM | POA: Diagnosis not present

## 2020-08-14 DIAGNOSIS — M25512 Pain in left shoulder: Secondary | ICD-10-CM | POA: Insufficient documentation

## 2020-08-14 DIAGNOSIS — M25612 Stiffness of left shoulder, not elsewhere classified: Secondary | ICD-10-CM | POA: Diagnosis not present

## 2020-08-14 DIAGNOSIS — M25611 Stiffness of right shoulder, not elsewhere classified: Secondary | ICD-10-CM | POA: Diagnosis not present

## 2020-08-14 NOTE — Therapy (Signed)
[facility] [address] [place], [address] Phone: [phone]   Fax:  [phone]  Physical Therapy Treatment/Discharge  Patient Details  Name: Ann Osborne MRN: [id number] Date of Birth: 05/06/1951 Referring Provider (PT): [name], MD    Encounter Date: 08/14/2020   PT End of Session - 08/14/20 1515    Visit Number 15    Number of Visits 21    Date for PT Re-Evaluation 07/18/20    Authorization Type 4/ 10    PT Start Time 4193    PT Stop Time 7902    PT Time Calculation (min) 15 min    Activity Tolerance Patient tolerated treatment well;No increased pain    Behavior During Therapy WFL for tasks assessed/performed           Past Medical History:  Diagnosis Date   ADD (attention deficit disorder)    Allergy    Depression    GERD (gastroesophageal reflux disease)    Hyperparathyroidism ([place])    Lyme disease 6-7 YEARS   history    Past Surgical History:  Procedure Laterality Date   BREAST BIOPSY Left 20 yrs ago   EYE SURGERY Bilateral 2007 OR 2008   LENS REPLACEMENT    THYROIDECTOMY Left 11/19/2017   Procedure: LEFT INFERIOR THYROIDECTOMY;  Surgeon: Armandina Gemma, MD;  Location: [facility];  Service: General;  Laterality: Left;   TUBAL LIGATION  1978   bilateral   [place]    There were no vitals filed for this visit.   Subjective Assessment - 08/14/20 1514    Subjective Patient reports that she has been doing great and is ready to D/C    Pertinent History High Cholesterol    Limitations Lifting    Patient Stated Goals Patients goal is to be able to be active with less shoulder pain.    Currently in Pain? No/denies            Reassessed All Goals during Session along with HEP. x15 minutes             PT Education - 08/14/20 1514    Education Details Form/technique with exercise    Person(s) Educated Patient    Methods  Explanation;Demonstration    Comprehension Verbalized understanding;Returned demonstration            PT Short Term Goals - 06/20/20 1617      PT SHORT TERM GOAL #1   Title Patient's worst pain will decrease to 5/10NPS after working in the yard for 1 hour.    Baseline 8/10 NPS; 06/20/20: 4/10NPS    Time 3    Period Weeks    Status Achieved    Target Date 06/06/20             PT Long Term Goals - 08/14/20 1516      PT LONG TERM GOAL #1   Title Patients worst pain will decrease to 1/10NPS after working in the yard for 2 or more hours.    Baseline 8/10NPS; 06/20/20: 4/10NPS: 08/14/20: 1/10NPS    Time 6    Period Weeks    Status Achieved      PT LONG TERM GOAL #2   Title Patients will increase FOTO score to 67 to show an improvement in functional activites.    Baseline 54: 06/20/20: 57, 08/14/20:92    Time 6    Period Weeks    Status Achieved  PT LONG TERM GOAL #3   Title Patient will be able to complete household tasks without an increase in pain to show improvement in tasks independence.    Baseline 06/20/20: able to perform household tasks without stopping or having an increase in pain.    Time 6    Period Weeks    Status Achieved      PT LONG TERM GOAL #4   Title Patient will be able to fasten bra behind to show an increase in IR shoulder ROM.    Baseline Has to fasten bra in front: 08/14/20: able to fasten and take off bra without being in front    Time 3    Period Weeks    Status Achieved                 Plan - 08/14/20 1524    Clinical Impression Statement Goals were reassessed during todays session at which patient has made great progress, demonstrated by her worst pain decreasing to 1/10NPS, being able to don and doff bra behind her back, and increasing her FOTO score to a 92 showing an improvement in her abilities to perform functional activities without limitations due to pain. Patient was D/C with progressive HEP to maintain progress achieved during  POC.    Personal Factors and Comorbidities Comorbidity 1;Age;Fitness    Comorbidities High Cholestorl    Examination-Activity Limitations Reach Overhead;Lift    Examination-Participation Restrictions Yard Work;Cleaning    Stability/Clinical Decision Making Stable/Uncomplicated    Clinical Decision Making Low    Rehab Potential Good    PT Frequency 2x / week    PT Duration 6 weeks    PT Treatment/Interventions ADLs/Self Care Home Management;Electrical Stimulation;Moist Heat;Ultrasound;Traction;Gait training;Functional mobility training;Therapeutic activities;Therapeutic exercise;Balance training;Neuromuscular re-education;Patient/family education;Manual techniques;Passive range of motion;Dry needling;Energy conservation;Spinal Manipulations;Joint Manipulations;Stair training    PT Next Visit Plan Check to see how HEP is going and D/C if able.    PT Home Exercise Plan AAROM supine hands clasped shoulder flexion, Scapular retraction, and wall slides, Towel AAROM IR    Consulted and Agree with Plan of Care Patient           Patient will benefit from skilled therapeutic intervention in order to improve the following deficits and impairments:  Decreased balance, Decreased activity tolerance, Decreased endurance, Decreased range of motion, Decreased strength, Impaired UE functional use, Pain, Impaired flexibility, Decreased mobility  Visit Diagnosis: Acute pain of right shoulder  Acute pain of left shoulder  Stiffness of right shoulder, not elsewhere classified  Stiffness of left shoulder, not elsewhere classified     Problem List Patient Active Problem List   Diagnosis Date Noted   Bilateral shoulder pain 04/03/2020   Abnormal Pap smear of cervix 07/24/2019   Cough 09/05/2018   Low serum vitamin B12 09/02/2018   Hyperparathyroidism, primary ([place]) 11/18/2017   Memory change 10/29/2017   Dysphagia 01/19/2017   Health care maintenance 03/14/2015   Constipation 09/17/2014    Right leg numbness 08/02/2014   Toenail fungus 01/30/2014   Hypercholesterolemia 01/30/2014   Rash 01/30/2014   Breast tenderness 07/25/2013   Sleep apnea 12/15/2012   Mild depression ([place]) 12/15/2012   Cerebellar lesion 12/15/2012   Hematuria 12/15/2012   3:34 PM, 08/14/20 Margarito Liner, SPT Student Physical Therapist [place]  Margarito Liner 08/14/2020, 3:32 PM  [facility] [address], [place], [address] Phone: [phone]   Fax:  [phone]  Name: Ann Mckellar  Osborne MRN: [id number] Date of Birth: 11-Jan-1951

## 2020-08-19 ENCOUNTER — Encounter: Payer: Self-pay | Admitting: Internal Medicine

## 2020-08-19 DIAGNOSIS — S92901A Unspecified fracture of right foot, initial encounter for closed fracture: Secondary | ICD-10-CM | POA: Insufficient documentation

## 2020-08-19 NOTE — Assessment & Plan Note (Signed)
Continue cpap.  

## 2020-08-19 NOTE — Assessment & Plan Note (Signed)
Feel this is aggravated by changing her diet. Discussed treatment - miralax.  Follow.

## 2020-08-19 NOTE — Assessment & Plan Note (Signed)
S/p left parathyroidectomy - Dr Gerkin.  Follow calcium.  

## 2020-08-19 NOTE — Assessment & Plan Note (Signed)
On effexor.  Doing better.  Has good support.  Follow.

## 2020-08-19 NOTE — Assessment & Plan Note (Signed)
Positive HPV.  Seeing Dr Leafy Ro.

## 2020-08-19 NOTE — Assessment & Plan Note (Signed)
Being followed by podiatry.  Schedule bone density.

## 2020-09-17 ENCOUNTER — Encounter: Payer: Self-pay | Admitting: Internal Medicine

## 2020-09-17 ENCOUNTER — Other Ambulatory Visit: Payer: Self-pay

## 2020-09-17 MED ORDER — OMEPRAZOLE 20 MG PO CPDR: 20.0000 mg | DELAYED_RELEASE_CAPSULE | Freq: Two times a day (BID) | ORAL | 1 refills | Status: DC

## 2020-09-17 MED ORDER — VENLAFAXINE HCL ER 75 MG PO CP24: 75.0000 mg | ORAL_CAPSULE | Freq: Every day | ORAL | 1 refills | Status: DC

## 2020-10-10 ENCOUNTER — Encounter: Payer: Self-pay | Admitting: Internal Medicine

## 2020-11-13 ENCOUNTER — Other Ambulatory Visit: Payer: Self-pay

## 2020-11-13 ENCOUNTER — Ambulatory Visit
Admission: RE | Admit: 2020-11-13 | Discharge: 2020-11-13 | Disposition: A | Discharge status: Home / Self Care | Payer: Medicare Other | Source: Ambulatory Visit | Attending: Internal Medicine | Admitting: Internal Medicine

## 2020-11-13 DIAGNOSIS — Z8739 Personal history of other diseases of the musculoskeletal system and connective tissue: Secondary | ICD-10-CM | POA: Diagnosis not present

## 2020-11-13 DIAGNOSIS — E2839 Other primary ovarian failure: Secondary | ICD-10-CM | POA: Insufficient documentation

## 2020-11-13 DIAGNOSIS — Z780 Asymptomatic menopausal state: Secondary | ICD-10-CM | POA: Diagnosis not present

## 2020-11-13 DIAGNOSIS — Z8781 Personal history of (healed) traumatic fracture: Secondary | ICD-10-CM | POA: Diagnosis not present

## 2020-12-10 ENCOUNTER — Other Ambulatory Visit: Payer: Medicare Other

## 2020-12-12 ENCOUNTER — Encounter: Payer: Medicare Other | Admitting: Internal Medicine

## 2021-02-19 ENCOUNTER — Other Ambulatory Visit (INDEPENDENT_AMBULATORY_CARE_PROVIDER_SITE_OTHER): Payer: Medicare Other

## 2021-02-19 ENCOUNTER — Other Ambulatory Visit: Payer: Self-pay

## 2021-02-19 DIAGNOSIS — E210 Primary hyperparathyroidism: Secondary | ICD-10-CM

## 2021-02-19 DIAGNOSIS — E7800 Pure hypercholesterolemia, unspecified: Secondary | ICD-10-CM | POA: Diagnosis not present

## 2021-02-19 LAB — COMPREHENSIVE METABOLIC PANEL
ALT: 18 U/L (ref 0–35)
AST: 19 U/L (ref 0–37)
Albumin: 4.2 g/dL (ref 3.5–5.2)
Alkaline Phosphatase: 70 U/L (ref 39–117)
BUN: 20 mg/dL (ref 6–23)
CO2: 29 mEq/L (ref 19–32)
Calcium: 9.7 mg/dL (ref 8.4–10.5)
Chloride: 104 mEq/L (ref 96–112)
Creatinine, Ser: 0.88 mg/dL (ref 0.40–1.20)
GFR: 65.79 mL/min (ref >60.00)
Glucose, Bld: 91 mg/dL (ref 70–99)
Potassium: 4.4 mEq/L (ref 3.5–5.1)
Sodium: 141 mEq/L (ref 135–145)
Total Bilirubin: 0.5 mg/dL (ref 0.2–1.2)
Total Protein: 7.7 g/dL (ref 6.0–8.3)

## 2021-02-19 LAB — LIPID PANEL
Cholesterol: 153 mg/dL (ref 0–200)
HDL: 38 mg/dL — ABNORMAL LOW (ref >39.00)
LDL Cholesterol: 89 mg/dL (ref 0–99)
NonHDL: 114.83
Total CHOL/HDL Ratio: 4
Triglycerides: 127 mg/dL (ref 0.0–149.0)
VLDL: 25.4 mg/dL (ref 0.0–40.0)

## 2021-02-19 LAB — PHOSPHORUS: Phosphorus: 3.9 mg/dL (ref 2.3–4.6)

## 2021-02-21 ENCOUNTER — Other Ambulatory Visit: Payer: Self-pay

## 2021-02-21 ENCOUNTER — Ambulatory Visit (INDEPENDENT_AMBULATORY_CARE_PROVIDER_SITE_OTHER): Payer: Medicare Other | Admitting: Internal Medicine

## 2021-02-21 VITALS — BP 112/64 | HR 71 | Temp 97.0°F | Resp 16 | Ht 67.0 in | Wt 177.0 lb

## 2021-02-21 DIAGNOSIS — R87619 Unspecified abnormal cytological findings in specimens from cervix uteri: Secondary | ICD-10-CM | POA: Diagnosis not present

## 2021-02-21 DIAGNOSIS — M25511 Pain in right shoulder: Secondary | ICD-10-CM | POA: Diagnosis not present

## 2021-02-21 DIAGNOSIS — M25512 Pain in left shoulder: Secondary | ICD-10-CM

## 2021-02-21 DIAGNOSIS — F320 Major depressive disorder, single episode, mild: Secondary | ICD-10-CM

## 2021-02-21 DIAGNOSIS — E7800 Pure hypercholesterolemia, unspecified: Secondary | ICD-10-CM

## 2021-02-21 DIAGNOSIS — G4730 Sleep apnea, unspecified: Secondary | ICD-10-CM | POA: Diagnosis not present

## 2021-02-21 DIAGNOSIS — Z0000 Encounter for general adult medical examination without abnormal findings: Secondary | ICD-10-CM

## 2021-02-21 DIAGNOSIS — Z8639 Personal history of other endocrine, nutritional and metabolic disease: Secondary | ICD-10-CM | POA: Diagnosis not present

## 2021-02-21 DIAGNOSIS — F32A Depression, unspecified: Secondary | ICD-10-CM

## 2021-02-21 NOTE — Progress Notes (Signed)
[name], female   DOB: Sep 17, 1951, 70 y.o.   MRN: [id number]   Subjective:    [name], female    DOB: [birth year], 70 y.o.   MRN: [id number]  HPI This visit occurred during the SARS-CoV-2 public health emergency.  Safety protocols were in place, including screening questions prior to the visit, additional usage of staff PPE, and extensive cleaning of exam room while observing appropriate contact time as indicated for disinfecting solutions.  Patient with past history of mild depression and hypercholesterolemia.  She comes in today to follow up on these issues as well as for a complete physical exam.  She reports she has been doing relatively well.  Does report increased fatigue.  Also reports increased shoulder pain - bilateral - extending into upper arms.  Persistent.  Increased pain.  Hurts to try and lift arms.  Limited mobility due to discomfort.  Also some aching in her knees, wrists and feet.  No significant hip, back or neck issues.  No chest pain or sob.  No acid reflux reported.  No abdominal pain.  Bowels moving.  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    Past Medical History:  Diagnosis Date  . ADD (attention deficit disorder)   . Allergy   . Basal cell carcinoma 08/05/2007   left upper back medial mid scapula   . Depression   . Dysplastic nevus 03/27/2016   left infrascapular, moderate atypia   . GERD (gastroesophageal reflux disease)   . Hyperparathyroidism ([place])   . Lyme disease 6-7 YEARS   history   Past Surgical History:  Procedure Laterality Date  . BREAST BIOPSY Left 20 yrs ago  . EYE SURGERY Bilateral 2007 OR 2008   LENS REPLACEMENT   . THYROIDECTOMY Left 11/19/2017   Procedure: LEFT INFERIOR THYROIDECTOMY;  Surgeon: Armandina Gemma, MD;  Location: [facility];  Service: General;  Laterality: Left;  . TUBAL LIGATION  1978   bilateral  . WISDOM TOOTH EXTRACTION  1980   Family History  Problem Relation Age of Onset  .  Uterine cancer Mother   . Heart disease Maternal Grandmother   . Alcohol abuse Maternal Grandfather   . Parkinson's disease Paternal Grandmother   . Depression Paternal Grandfather   . Breast cancer Neg Hx    Social History   Socioeconomic History  . Marital status: Married    Spouse name: Not on file  . Number of children: 2  . Years of education: Not on file  . Highest education level: Not on file  Occupational History  . Not on file  Tobacco Use  . Smoking status: Never Smoker  . Smokeless tobacco: Never Used  Vaping Use  . Vaping Use: Never used  Substance and Sexual Activity  . Alcohol use: Yes    Alcohol/week: 0.0 standard drinks    Comment: occasional  . Drug use: No  . Sexual activity: Not Currently  Other Topics Concern  . Not on file  Social History Narrative  . Not on file   Social Determinants of Health   Financial Resource Strain: Not on file  Food Insecurity: Not on file  Transportation Needs: Not on file  Physical Activity: Not on file  Stress: Not on file  Social Connections: Not on file    Outpatient Encounter Medications as of 02/21/2021  Medication Sig  . Alpha-Lipoic Acid 50 MG TABS Take by mouth daily.  Marland Kitchen BIOTIN PO Take 1 capsule by mouth daily.  Marland Kitchen  Multiple Vitamins-Minerals (ICAPS AREDS 2 PO) Take by mouth 2 (two) times daily.  . Multiple Vitamins-Minerals (MEGA MULTIVITAMIN FOR WOMEN PO) Take by mouth daily.  Marland Kitchen omeprazole (PRILOSEC) 20 MG capsule Take 1 capsule (20 mg total) by mouth 2 (two) times daily before a meal.  . TURMERIC PO Take 1,000 mg by mouth daily.  Marland Kitchen venlafaxine XR (EFFEXOR XR) 75 MG 24 hr capsule Take 1 capsule (75 mg total) by mouth daily.   No facility-administered encounter medications on file as of 02/21/2021.    Review of Systems  Constitutional: Negative for appetite change and unexpected weight change.  HENT: Negative for congestion, sinus pressure and sore throat.   Eyes: Negative for pain and visual disturbance.   Respiratory: Negative for cough, chest tightness and shortness of breath.   Cardiovascular: Negative for chest pain, palpitations and leg swelling.  Gastrointestinal: Negative for abdominal pain, diarrhea, nausea and vomiting.  Genitourinary: Negative for difficulty urinating and dysuria.  Musculoskeletal: Negative for myalgias.       Shoulder and upper arm discomfort as outlined.  Joint pain as outlined.    Skin: Negative for color change and rash.  Neurological: Negative for dizziness, light-headedness and headaches.  Hematological: Negative for adenopathy. Does not bruise/bleed easily.  Psychiatric/Behavioral: Negative for agitation and dysphoric mood.       Objective:    Physical Exam Vitals reviewed.  Constitutional:      General: She is not in acute distress.    Appearance: Normal appearance. She is well-developed.  HENT:     Head: Normocephalic and atraumatic.     Right Ear: External ear normal.     Left Ear: External ear normal.  Eyes:     General: No scleral icterus.       Right eye: No discharge.        Left eye: No discharge.     Conjunctiva/sclera: Conjunctivae normal.  Neck:     Thyroid: No thyromegaly.  Cardiovascular:     Rate and Rhythm: Normal rate and regular rhythm.  Pulmonary:     Effort: No tachypnea, accessory muscle usage or respiratory distress.     Breath sounds: Normal breath sounds. No decreased breath sounds or wheezing.  Chest:  Breasts:     Right: No inverted nipple, mass, nipple discharge or tenderness (no axillary adenopathy).     Left: No inverted nipple, mass, nipple discharge or tenderness (no axilarry adenopathy).    Abdominal:     General: Bowel sounds are normal.     Palpations: Abdomen is soft.     Tenderness: There is no abdominal tenderness.  Musculoskeletal:        General: No swelling or tenderness.     Cervical back: Neck supple. No tenderness.     Comments: Increased pain with attempts at full extension of her arms (over  her head).  No increased neck stiffness.   Lymphadenopathy:     Cervical: No cervical adenopathy.  Skin:    Findings: No erythema or rash.  Neurological:     Mental Status: She is alert and oriented to person, place, and time.  Psychiatric:        Mood and Affect: Mood normal.        Behavior: Behavior normal.     BP 112/64   Pulse 71   Temp (!) 97 F (36.1 C) (Temporal)   Resp 16   Ht 5' 7" (1.702 m)   Wt 177 lb (80.3 kg)   LMP 07/16/1995 (LMP Unknown)  SpO2 97%   BMI 27.72 kg/m  Wt Readings from Last 3 Encounters:  02/21/21 177 lb (80.3 kg)  08/07/20 175 lb 12.8 oz (79.7 kg)  04/03/20 179 lb 6.4 oz (81.4 kg)     Lab Results  Component Value Date   WBC 4.5 04/03/2020   HGB 13.1 04/03/2020   HCT 38.1 04/03/2020   PLT 258.0 04/03/2020   GLUCOSE 91 02/19/2021   CHOL 153 02/19/2021   TRIG 127.0 02/19/2021   HDL 38.00 (L) 02/19/2021   LDLDIRECT 151.0 04/03/2020   LDLCALC 89 02/19/2021   ALT 18 02/19/2021   AST 19 02/19/2021   NA 141 02/19/2021   K 4.4 02/19/2021   CL 104 02/19/2021   CREATININE 0.88 02/19/2021   BUN 20 02/19/2021   CO2 29 02/19/2021   TSH 3.19 08/01/2020    DG Bone Density  Result Date: 11/13/2020 EXAM: DUAL X-RAY ABSORPTIOMETRY (DXA) FOR BONE MINERAL DENSITY IMPRESSION: Your patient Riyan Gavina completed a BMD test on 11/13/2020 using the [place] (software version: 14.10) manufactured by [employer]. The following summarizes the results of our evaluation. Technologist: SCE PATIENT BIOGRAPHICAL: Name: Ellyse, Rotolo Patient ID: [id number] Birth Date: 1951/11/04 Height: 66.5 in. Gender: Female Exam Date: 11/13/2020 Weight: 174.4 lbs. Indications: Advanced Age, Caucasian, History of Fracture (Adult), Osteoarthritis, Postmenopausal Fractures: Right foot Treatments: Multi-Vitamin, Omeprazole DENSITOMETRY RESULTS: Site      Region     Measured Date Measured Age WHO Classification Young Adult T-score BMD         %Change vs.  Previous Significant Change (*) AP Spine L1-L4 (L3) 11[age]13/[age] 71.8 Normal 2.0 1.425 g/cm2 0.6% - AP Spine L1-L4 (L3) 12/30/2016 67.9 Normal 1.9 1.417 g/cm2 - - DualFemur Neck Left 11/13/2020 71.8 Normal -0.5 0.968 g/cm2 2.2% - DualFemur Neck Left 12/30/2016 67.9 Normal -0.7 0.947 g/cm2 - - DualFemur Total Mean 11/13/2020 71.8 Normal 0.1 1.021 g/cm2 1.8% - DualFemur Total Mean 12/30/2016 67.9 Normal 0.0 1.003 g/cm2 - - ASSESSMENT: The BMD measured at Femur Neck Left is 0.968 g/cm2 with a T-score of -0.5. This patient is considered normal according to [facility]) criteria. The scan quality is good. L3 was excluded due to degenerative changes. Compared with prior study, there has been no significant change in the spine. Compared with prior study, there has been no significant change in the total hip. World Pharmacologist [facility]) criteria for post-menopausal, Caucasian Women: Normal:                   T-score at or above -1 SD Osteopenia/low bone mass: T-score between -1 and -2.5 SD Osteoporosis:             T-score at or below -2.5 SD RECOMMENDATIONS: 1. All patients should optimize calcium and vitamin D intake. 2. Consider FDA-approved medical therapies in postmenopausal women and men aged 51 years and older, based on the following: a. A hip or vertebral(clinical or morphometric) fracture b. T-score < -2.5 at the femoral neck or spine after appropriate evaluation to exclude secondary causes c. Low bone mass (T-score between -1.0 and -2.5 at the femoral neck or spine) and a 10-year probability of a hip fracture > 3% or a 10-year probability of a major osteoporosis-related fracture > 20% based on the [place]-adapted WHO algorithm 3. Clinician judgment and/or patient preferences may indicate treatment for people with 10-year fracture probabilities above or below these levels FOLLOW-UP: People with diagnosed cases of osteoporosis or at high risk for  fracture should have regular bone mineral density  tests. For patients eligible for Medicare, routine testing is allowed once every 2 years. The testing frequency can be increased to one year for patients who have rapidly progressing disease, those who are receiving or discontinuing medical therapy to restore bone mass, or have additional risk factors. I have reviewed this report, and agree with the above findings. [facility] Electronically Signed   By: Lowella Grip III M.D.   On: 11/13/2020 10:46       Assessment & Plan:   Problem List Items Addressed This Visit    Abnormal Pap smear of cervix    Saw Dr Leafy Ro 06/2020.  Recommended f/u in one year.       Bilateral shoulder pain - Primary    Persistent bilateral shoulder pain as outlined.  Takes tylenol prn.  Discussed further w/up and treatment.  Check esr and crp - r/o PMR.  Stretches. If ok, consider trail of antiinflammatory.  She feels she needs something to help control pain.       Relevant Orders   Sedimentation rate (Completed)   C-reactive protein (Completed)   Health care maintenance    Physical today 02/21/21.  Colonoscopy 01/2015 - tortuous colon, diverticulosis in the sigmoid colon and the descending colon.  Recommended f/u colonoscopy in 10 years.  Mammogram 05/17/20 - Birads I.  Bone density 11/13/20 - normal.       History of hyperparathyroidism    S/p left parathyroidectomy - Dr Harlow Asa.  Follow calcium.       Hypercholesterolemia    The 10-year ASCVD risk score Mikey Bussing DC Brooke Bonito., et al., 2013) is: 9%   Values used to calculate the score:     Age: 58 years     Sex: Female     Is Non-Hispanic African American: No     Diabetic: No     Tobacco smoker: No     Systolic Blood Pressure: 382 mmHg     Is BP treated: No     HDL Cholesterol: 38 mg/dL     Total Cholesterol: 153 mg/dL   Cholesterol improved.  Calculated cholesterol risk 9%.  She has wanted to continue to work on diet and exercise.  Follow lipid panel.       Mild depression ([place])    Continue  effexor.  Stable.       Sleep apnea    Continue cpap.           [name], MD

## 2021-02-22 ENCOUNTER — Encounter: Payer: Self-pay | Admitting: Internal Medicine

## 2021-02-22 LAB — SEDIMENTATION RATE: Sed Rate: 36 mm/h — ABNORMAL HIGH (ref 0–30)

## 2021-02-22 LAB — C-REACTIVE PROTEIN: CRP: 3.4 mg/L (ref <8.0)

## 2021-02-22 NOTE — Assessment & Plan Note (Signed)
Saw Dr Leafy Ro 06/2020.  Recommended f/u in one year.

## 2021-02-22 NOTE — Assessment & Plan Note (Signed)
Continue cpap.  

## 2021-02-22 NOTE — Assessment & Plan Note (Signed)
Physical today 02/21/21.  Colonoscopy 01/2015 - tortuous colon, diverticulosis in the sigmoid colon and the descending colon.  Recommended f/u colonoscopy in 10 years.  Mammogram 05/17/20 - Birads I.  Bone density 11/13/20 - normal.

## 2021-02-22 NOTE — Assessment & Plan Note (Signed)
Continue effexor.  Stable.

## 2021-02-22 NOTE — Assessment & Plan Note (Signed)
The 10-year ASCVD risk score Mikey Bussing DC Brooke Bonito., et al., 2013) is: 9%   Values used to calculate the score:     Age: 70 years     Sex: Female     Is Non-Hispanic African American: No     Diabetic: No     Tobacco smoker: No     Systolic Blood Pressure: 654 mmHg     Is BP treated: No     HDL Cholesterol: 38 mg/dL     Total Cholesterol: 153 mg/dL   Cholesterol improved.  Calculated cholesterol risk 9%.  She has wanted to continue to work on diet and exercise.  Follow lipid panel.

## 2021-02-22 NOTE — Assessment & Plan Note (Signed)
Persistent bilateral shoulder pain as outlined.  Takes tylenol prn.  Discussed further w/up and treatment.  Check esr and crp - r/o PMR.  Stretches. If ok, consider trail of antiinflammatory.  She feels she needs something to help control pain.

## 2021-02-22 NOTE — Assessment & Plan Note (Signed)
S/p left parathyroidectomy - Dr Harlow Asa.  Follow calcium.

## 2021-02-26 ENCOUNTER — Encounter: Payer: Self-pay | Admitting: Internal Medicine

## 2021-02-27 MED ORDER — MELOXICAM 7.5 MG PO TABS: 7.5000 mg | ORAL_TABLET | Freq: Every day | ORAL | 0 refills | Status: DC | PRN

## 2021-02-27 NOTE — Telephone Encounter (Signed)
rx sent in for meloxicam.  See my chart message.

## 2021-04-04 ENCOUNTER — Ambulatory Visit: Payer: Medicare Other

## 2021-04-05 ENCOUNTER — Ambulatory Visit (INDEPENDENT_AMBULATORY_CARE_PROVIDER_SITE_OTHER): Payer: Medicare Other

## 2021-04-05 VITALS — Ht 67.0 in | Wt 177.0 lb

## 2021-04-05 DIAGNOSIS — Z1231 Encounter for screening mammogram for malignant neoplasm of breast: Secondary | ICD-10-CM

## 2021-04-05 DIAGNOSIS — Z0000 Encounter for general adult medical examination without abnormal findings: Secondary | ICD-10-CM | POA: Diagnosis not present

## 2021-04-05 NOTE — Patient Instructions (Addendum)
Ms. Ann Osborne , Thank you for taking time to come for your Medicare Wellness Visit. I appreciate your ongoing commitment to your health goals. Please review the following plan we discussed and let me know if I can assist you in the future.   These are the goals we discussed: Goals      Patient Stated   .  I would like to lose about 10lbs (pt-stated)      Weight desired 165lb-170lb       This is a list of the screening recommended for you and due dates:  Health Maintenance  Topic Date Due  . COVID-19 Vaccine (3 - Booster for Moderna series) 06/10/2021*  . Zoster (Shingles) Vaccine (1 of 2) 06/10/2021*  . Tetanus Vaccine  04/05/2022*  . Mammogram  05/17/2021  . Flu Shot  06/10/2021  . Colon Cancer Screening  01/14/2025  . DEXA scan (bone density measurement)  Completed  . Hepatitis C Screening: USPSTF Recommendation to screen - Ages 34-79 yo.  Completed  . Pneumonia vaccines  Completed  . HPV Vaccine  Aged Out  *Topic was postponed. The date shown is not the original due date.    Immunizations Immunization History  Administered Date(s) Administered  . Fluad Quad(high Dose 65+) 08/06/2019, 08/07/2020  . Influenza, High Dose Seasonal PF 09/02/2018  . Influenza, Quadrivalent, Recombinant, Inj, Pf 08/19/2017  . Influenza,inj,Quad PF,6+ Mos 08/25/2013, 08/02/2014  . Influenza-Unspecified 08/20/2015, 08/19/2016  . Moderna Sars-Covid-2 Vaccination 02/03/2020, 03/07/2020  . Pneumococcal Conjugate-13 08/19/2013  . Pneumococcal Polysaccharide-23 08/19/2012, 10/29/2017   Mammogram A mammogram is a low energy X-ray of the breasts that is done to check for abnormal changes. This procedure can screen for and detect any changes that may indicate breast cancer. Mammograms are regularly done on women. A man may have a mammogram if he has a lump or swelling in his breast. A mammogram can also identify other changes and variations in the breast, such as:  Inflammation of the breast tissue  (mastitis).  An infected area that contains a collection of pus (abscess).  A fluid-filled sac (cyst).  Fibrocystic changes. This is when breast tissue becomes denser, which can make the tissue feel rope-like or uneven under the skin.  Tumors that are not cancerous (benign). Tell a health care provider:  About any allergies you have.  If you have breast implants.  If you have had previous breast disease, biopsy, or surgery.  If you are breastfeeding.  If you are younger than age 32.  If you have a family history of breast cancer.  Whether you are pregnant or may be pregnant. What are the risks? Generally, this is a safe procedure. However, problems may occur, including:  Exposure to radiation. Radiation levels are very low with this test.  The results being misinterpreted.  The need for further tests.  The inability of the mammogram to detect certain cancers. What happens before the procedure?  Schedule your test about 1-2 weeks after your menstrual period if you are still menstruating. This is usually when your breasts are the least tender.  If you have had a mammogram done at a different facility in the past, get the mammogram X-rays or have them sent to your current exam facility. The new and old images will be compared.  Wash your breasts and underarms on the day of the test.  Do not wear deodorants, perfumes, lotions, or powders anywhere on your body on the day of the test.  Remove any jewelry from your  neck.  Wear clothes that you can change into and out of easily. What happens during the procedure?  You will undress from the waist up and put on a gown that opens in the front.  You will stand in front of the X-ray machine.  Each breast will be placed between two plastic or glass plates. The plates will compress your breast for a few seconds. Try to stay as relaxed as possible during the procedure. This does not cause any harm to your breasts and any  discomfort you feel will be very brief.  X-rays will be taken from different angles of each breast. The procedure may vary among health care providers and hospitals.   What happens after the procedure?  The mammogram will be examined by a specialist (radiologist).  You may need to repeat certain parts of the test, depending on the quality of the images. This is commonly done if the radiologist needs a better view of the breast tissue.  You may resume your normal activities.  It is up to you to get the results of your procedure. Ask your health care provider, or the department that is doing the procedure, when your results will be ready. Summary  A mammogram is a low energy X-ray of the breasts that is done to check for abnormal changes. A man may have a mammogram if he has a lump or swelling in his breast.  If you have had a mammogram done at a different facility in the past, get the mammogram X-rays or have them sent to your current exam facility in order to compare them.  Schedule your test about 1-2 weeks after your menstrual period if you are still menstruating.  For this test, each breast will be placed between two plastic or glass plates. The plates will compress your breast for a few seconds.  Ask when your test results will be ready. Make sure you get your test results. This information is not intended to replace advice given to you by your health care provider. Make sure you discuss any questions you have with your health care provider. Document Revised: 06/17/2018 Document Reviewed: 06/17/2018 Elsevier Patient Education  [place].

## 2021-04-05 NOTE — Progress Notes (Signed)
Subjective:   Ann Osborne is a 70 y.o. female who presents for Medicare Annual (Subsequent) preventive examination.  Review of Systems    No ROS.  Medicare Wellness Virtual Visit.    Cardiac Risk Factors include: advanced age (>21men, >78 women)     Objective:    Today's Vitals   04/05/21 0954  Weight: 177 lb (80.3 kg)  Height: 5\' 7"  (1.702 m)   Body mass index is 27.72 kg/m.  Advanced Directives 04/05/2021 04/03/2020 04/01/2019 11/19/2017 11/09/2017 10/29/2017 10/28/2016  Does Patient Have a Medical Advance Directive? Yes Yes Yes Yes Yes Yes Yes  Type of Paramedic of [place];Living will [place];Living will [place];Living will Living will Living will Living will;Healthcare Power of [place]  Does patient want to make changes to medical advance directive? No - Patient declined No - Patient declined No - Patient declined - No - Patient declined No - Patient declined No - Patient declined  Copy of [place] in Chart? Yes - validated most recent copy scanned in chart (See row information) Yes - validated most recent copy scanned in chart (See row information) Yes - validated most recent copy scanned in chart (See row information) No - copy requested No - copy requested No - copy requested No - copy requested    Current Medications (verified) Outpatient Encounter Medications as of 04/05/2021  Medication Sig  . Alpha-Lipoic Acid 50 MG TABS Take by mouth daily.  Marland Kitchen BIOTIN PO Take 1 capsule by mouth daily.  . meloxicam (MOBIC) 7.5 MG tablet Take 1 tablet (7.5 mg total) by mouth daily as needed for pain.  . Multiple Vitamins-Minerals (ICAPS AREDS 2 PO) Take by mouth 2 (two) times daily.  . Multiple Vitamins-Minerals (MEGA MULTIVITAMIN FOR WOMEN PO) Take by mouth daily.  Marland Kitchen omeprazole (PRILOSEC) 20 MG capsule Take 1 capsule (20 mg total) by mouth 2 (two) times daily before a  meal.  . TURMERIC PO Take 1,000 mg by mouth daily.  Marland Kitchen venlafaxine XR (EFFEXOR XR) 75 MG 24 hr capsule Take 1 capsule (75 mg total) by mouth daily.   No facility-administered encounter medications on file as of 04/05/2021.    Allergies (verified) Compazine [prochlorperazine edisylate] and Sulfa antibiotics   History: Past Medical History:  Diagnosis Date  . ADD (attention deficit disorder)   . Allergy   . Basal cell carcinoma 08/05/2007   left upper back medial mid scapula   . Depression   . Dysplastic nevus 03/27/2016   left infrascapular, moderate atypia   . GERD (gastroesophageal reflux disease)   . Hyperparathyroidism ([place])   . Lyme disease 6-7 YEARS   history   Past Surgical History:  Procedure Laterality Date  . BREAST BIOPSY Left 20 yrs ago  . EYE SURGERY Bilateral 2007 OR 2008   LENS REPLACEMENT   . THYROIDECTOMY Left 11/19/2017   Procedure: LEFT INFERIOR THYROIDECTOMY;  Surgeon: Armandina Gemma, MD;  Location: [facility];  Service: General;  Laterality: Left;  . TUBAL LIGATION  1978   bilateral  . WISDOM TOOTH EXTRACTION  1980   Family History  Problem Relation Age of Onset  . Uterine cancer Mother   . Heart disease Maternal Grandmother   . Alcohol abuse Maternal Grandfather   . Parkinson's disease Paternal Grandmother   . Depression Paternal Grandfather   . Breast cancer Neg Hx    Social History   Socioeconomic History  . Marital status:  Married    Spouse name: Not on file  . Number of children: 2  . Years of education: Not on file  . Highest education level: Not on file  Occupational History  . Not on file  Tobacco Use  . Smoking status: Never Smoker  . Smokeless tobacco: Never Used  Vaping Use  . Vaping Use: Never used  Substance and Sexual Activity  . Alcohol use: Yes    Alcohol/week: 0.0 standard drinks    Comment: occasional  . Drug use: No  . Sexual activity: Not Currently  Other Topics Concern  . Not on file  Social History Narrative  . Not  on file   Social Determinants of Health   Financial Resource Strain: Low Risk   . Difficulty of Paying Living Expenses: Not hard at all  Food Insecurity: No Food Insecurity  . Worried About Charity fundraiser in the Last Year: Never true  . Ran Out of Food in the Last Year: Never true  Transportation Needs: No Transportation Needs  . Lack of Transportation (Medical): No  . Lack of Transportation (Non-Medical): No  Physical Activity: Not on file  Stress: No Stress Concern Present  . Feeling of Stress : Not at all  Social Connections: Unknown  . Frequency of Communication with Friends and Family: More than three times a week  . Frequency of Social Gatherings with Friends and Family: Not on file  . Attends Religious Services: More than 4 times per year  . Active Member of Clubs or Organizations: Not on file  . Attends Archivist Meetings: Not on file  . Marital Status: Married    Tobacco Counseling Counseling given: Not Answered   Clinical Intake:           Diabetes: No  How often do you need to have someone help you when you read instructions, pamphlets, or other written materials from your doctor or pharmacy?: 1 - Never    Interpreter Needed?: No      Activities of Daily Living In your present state of health, do you have any difficulty performing the following activities: 04/05/2021  Hearing? Y  Comment Hearing aids  Vision? N  Difficulty concentrating or making decisions? N  Walking or climbing stairs? Y  Comment Paces self.  Dressing or bathing? N  Doing errands, shopping? N  Preparing Food and eating ? N  Using the Toilet? N  In the past six months, have you accidently leaked urine? N  Do you have problems with loss of bowel control? N  Managing your Medications? N  Managing your Finances? N  Housekeeping or managing your Housekeeping? N  Some recent data might be hidden    Patient Care Team: [name], MD as PCP - General  (Internal Medicine)  Indicate any recent Medical Services you may have received from other than [facility] providers in the past year (date may be approximate).     Assessment:   This is a routine wellness examination for Ann Osborne.  I connected with Bralee today by telephone and verified that I am speaking with the correct person using two identifiers. Location patient: home Location provider: work Persons participating in the virtual visit: patient, Marine scientist.    I discussed the limitations, risks, security and privacy concerns of performing an evaluation and management service by telephone and the availability of in person appointments. The patient expressed understanding and verbally consented to this telephonic visit.    Interactive audio and video telecommunications were attempted  between this provider and patient, however failed, due to patient having technical difficulties OR patient did not have access to video capability.  We continued and completed visit with audio only.  Some vital signs may be absent or patient reported.   Hearing/Vision screen  Hearing Screening   125Hz  250Hz  500Hz  1000Hz  2000Hz  3000Hz  4000Hz  6000Hz  8000Hz   Right ear:           Left ear:           Comments: Patient is able to hear conversational tones without difficulty.  No issues reported.  Vision Screening Comments: Followed by Suzie Portela ([place]) Bilateral cataracts extracted  Wears glasses for distance   Dietary issues and exercise activities discussed: Current Exercise Habits: Home exercise routine, Type of exercise: stretching, Intensity: Mild  Low cholesterol diet; reduce sugar Good water intake  Goals Addressed   None    Depression Screen PHQ 2/9 Scores 04/05/2021 02/21/2021 04/03/2020 04/03/2020 04/01/2019 04/01/2019 10/29/2017  PHQ - 2 Score 0 3 0 0 0 1 0  PHQ- 9 Score 0 7 0 - 0 - 0    Fall Risk Fall Risk  04/05/2021 04/03/2020 04/03/2020 04/01/2019 04/01/2019  Falls in the past year? 0 1 0 0 0   Comment - - - - -  Number falls in past yr: 0 0 - 0 -  Injury with Fall? 0 1 - 0 -  Comment - broken right foot - - -  Follow up Falls evaluation completed Falls evaluation completed Falls evaluation completed Falls evaluation completed -    FALL RISK PREVENTION PERTAINING TO THE HOME: Handrails in use when climbing stairs?Yes Home free of loose throw rugs in walkways, pet beds, electrical cords, etc? Yes  Adequate lighting in your home to reduce risk of falls? Yes   ASSISTIVE DEVICES UTILIZED TO PREVENT FALLS: Life alert? No  Use of a cane, walker or w/c? No  Grab bars in the bathroom? No  Shower chair or bench in shower? No  Elevated toilet seat or a handicapped toilet? No   TIMED UP AND GO: Was the test performed? No .   Cognitive Function: Patient is alert and oriented x3.  Enjoys stimulating playing games online, reading and other brain health activities.  MMSE/6CIT deferred. Normal by direct communication/observation.   MMSE - Mini Mental State Exam 10/29/2017  Orientation to time 5  Orientation to Place 5  Registration 3  Attention/ Calculation 5  Recall 3  Language- name 2 objects 2  Language- repeat 1  Language- follow 3 step command 3  Language- read & follow direction 1  Write a sentence 1  Copy design 1  Total score 30     6CIT Screen 04/01/2019 10/28/2016  What Year? 0 points 0 points  What month? 0 points 0 points  What time? 0 points 0 points  Count back from 20 0 points 0 points  Months in reverse 0 points 0 points  Repeat phrase 0 points 0 points  Total Score 0 0    Immunizations Immunization History  Administered Date(s) Administered  . Fluad Quad(high Dose 65+) 08/06/2019, 08/07/2020  . Influenza, High Dose Seasonal PF 09/02/2018  . Influenza, Quadrivalent, Recombinant, Inj, Pf 08/19/2017  . Influenza,inj,Quad PF,6+ Mos 08/25/2013, 08/02/2014  . Influenza-Unspecified 08/20/2015, 08/19/2016  . Moderna Sars-Covid-2 Vaccination 02/03/2020,  03/07/2020  . Pneumococcal Conjugate-13 08/19/2013  . Pneumococcal Polysaccharide-23 08/19/2012, 10/29/2017    TDAP status: Due, Education has been provided regarding the importance of this vaccine. Advised may receive  this vaccine at local pharmacy or Health Dept. Aware to provide a copy of the vaccination record if obtained from local pharmacy or Health Dept. Verbalized acceptance and understanding. Deferred.  Health Maintenance Health Maintenance  Topic Date Due  . COVID-19 Vaccine (3 - Booster for Moderna series) 06/10/2021 (Originally 08/07/2020)  . Zoster Vaccines- Shingrix (1 of 2) 06/10/2021 (Originally 12/31/1998)  . TETANUS/TDAP  04/05/2022 (Originally 01/01/1970)  . MAMMOGRAM  05/17/2021  . INFLUENZA VACCINE  06/10/2021  . COLONOSCOPY (Pts 45-51yrs Insurance coverage will need to be confirmed)  01/14/2025  . DEXA SCAN  Completed  . Hepatitis C Screening  Completed  . PNA vac Low Risk Adult  Completed  . HPV VACCINES  Aged Out   Colorectal cancer screening: Type of screening: Colonoscopy. Completed 01/15/15. Repeat every 10 years  Mammogram status: Completed 05/17/20. Repeat every year. Ordered. Verbalizes understanding to scheduled after last competed calendar date.   Lung Cancer Screening: (Low Dose CT Chest recommended if Age 38-80 years, 30 pack-year currently smoking OR have quit w/in 15years.) does not qualify.   Covid booster- completed. Agrees to update immunization record.   Shingle vaccine- completed. Agrees to update immunization record.   Vision Screening: Recommended annual ophthalmology exams for early detection of glaucoma and other disorders of the eye. Is the patient up to date with their annual eye exam?  Yes   Dental Screening: Recommended annual dental exams for proper oral hygiene. Visits as recommended.   Community Resource Referral / Chronic Care Management: CRR required this visit?  No   CCM required this visit?  No      Plan:   Keep all routine  maintenance appointments.   I have personally reviewed and noted the following in the patient's chart:   . Medical and social history . Use of alcohol, tobacco or illicit drugs  . Current medications and supplements including opioid prescriptions. Patient is not currently taking opioid. . Functional ability and status . Nutritional status . Physical activity . Advanced directives . List of other physicians . Hospitalizations, surgeries, and ER visits in previous 12 months . Vitals . Screenings to include cognitive, depression, and falls . Referrals and appointments  In addition, I have reviewed and discussed with patient certain preventive protocols, quality metrics, and best practice recommendations. A written personalized care plan for preventive services as well as general preventive health recommendations were provided to patient via mychart.     Varney Biles, LPN   6/96/2952

## 2021-04-17 ENCOUNTER — Other Ambulatory Visit: Payer: Self-pay

## 2021-04-17 ENCOUNTER — Encounter: Payer: Self-pay | Admitting: Internal Medicine

## 2021-04-17 MED ORDER — VENLAFAXINE HCL ER 75 MG PO CP24: 75.0000 mg | ORAL_CAPSULE | Freq: Every day | ORAL | 1 refills | Status: DC

## 2021-04-18 ENCOUNTER — Ambulatory Visit (INDEPENDENT_AMBULATORY_CARE_PROVIDER_SITE_OTHER): Payer: Medicare Other | Admitting: Dermatology

## 2021-04-18 ENCOUNTER — Other Ambulatory Visit: Payer: Self-pay

## 2021-04-18 DIAGNOSIS — L299 Pruritus, unspecified: Secondary | ICD-10-CM | POA: Diagnosis not present

## 2021-04-18 DIAGNOSIS — I8310 Varicose veins of unspecified lower extremity with inflammation: Secondary | ICD-10-CM

## 2021-04-18 DIAGNOSIS — L821 Other seborrheic keratosis: Secondary | ICD-10-CM | POA: Diagnosis not present

## 2021-04-18 DIAGNOSIS — D1800 Hemangioma unspecified site: Secondary | ICD-10-CM

## 2021-04-18 DIAGNOSIS — L578 Other skin changes due to chronic exposure to nonionizing radiation: Secondary | ICD-10-CM

## 2021-04-18 DIAGNOSIS — D229 Melanocytic nevi, unspecified: Secondary | ICD-10-CM

## 2021-04-18 DIAGNOSIS — Z1283 Encounter for screening for malignant neoplasm of skin: Secondary | ICD-10-CM

## 2021-04-18 DIAGNOSIS — L814 Other melanin hyperpigmentation: Secondary | ICD-10-CM | POA: Diagnosis not present

## 2021-04-18 DIAGNOSIS — Z86018 Personal history of other benign neoplasm: Secondary | ICD-10-CM

## 2021-04-18 DIAGNOSIS — Z85828 Personal history of other malignant neoplasm of skin: Secondary | ICD-10-CM

## 2021-04-18 DIAGNOSIS — I781 Nevus, non-neoplastic: Secondary | ICD-10-CM | POA: Diagnosis not present

## 2021-04-18 MED ORDER — OPZELURA 1.5 % EX CREA: 1.0000 "application " | TOPICAL_CREAM | Freq: Two times a day (BID) | CUTANEOUS | 3 refills | Status: AC

## 2021-04-18 NOTE — Patient Instructions (Signed)

## 2021-04-18 NOTE — Progress Notes (Signed)
Follow-Up Visit   Subjective  Ann Osborne is a 70 y.o. female who presents for the following: Annual Exam (Hx BCC, dysplastic nevus ) and Pruritis (Of the B/L arms - patient has excoriations from scratching where they have been itching so bad. Originally diagnosed with eczema, but patient thinks that symptoms are more consistent with brachioradial pruritis since there is no rash just itching. She uses Clobetasol/CeraVe mix and would like refills). The patient presents for Total-Body Skin Exam (TBSE) for skin cancer screening and mole check.  The following portions of the chart were reviewed this encounter and updated as appropriate:   Tobacco  Allergies  Meds  Problems  Med Hx  Surg Hx  Fam Hx      Review of Systems:  No other skin or systemic complaints except as noted in HPI or Assessment and Plan.  Objective  Well appearing patient in no apparent distress; mood and affect are within normal limits.  A full examination was performed including scalp, head, eyes, ears, nose, lips, neck, chest, axillae, abdomen, back, buttocks, bilateral upper extremities, bilateral lower extremities, hands, feet, fingers, toes, fingernails, and toenails. All findings within normal limits unless otherwise noted below.  R arm Excoriations on the arms.     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  Not to goal. Start Opzelura to aa's QD-BID. Sample given today.   D/C Clobetasol/CeraVe mix due to risk of skin atrophy.   Ruxolitinib Phosphate (OPZELURA) 1.5 % CREA - R arm Apply 1 application topically 2 (two) times daily. Apply to itchy areas on arms QD-BID PRN flares.  Skin cancer screening  Lentigines - Scattered tan macules - Due to sun exposure - Benign-appering, observe - Recommend daily broad spectrum sunscreen SPF 30+ to sun-exposed areas, reapply every 2 hours as needed. - Call for any changes  Seborrheic Keratoses - Stuck-on, waxy,  tan-brown papules and/or plaques  - Benign-appearing - Discussed benign etiology and prognosis. - Observe - Call for any changes  Melanocytic Nevi - Tan-brown and/or pink-flesh-colored symmetric macules and papules - Benign appearing on exam today - Observation - Call clinic for new or changing moles - Recommend daily use of broad spectrum spf 30+ sunscreen to sun-exposed areas.   Hemangiomas - Red papules - Discussed benign nature - Observe - Call for any changes  Actinic Damage - Chronic condition, secondary to cumulative UV/sun exposure - diffuse scaly erythematous macules with underlying dyspigmentation - Recommend daily broad spectrum sunscreen SPF 30+ to sun-exposed areas, reapply every 2 hours as needed.  - Staying in the shade or wearing long sleeves, sun glasses (UVA+UVB protection) and wide brim hats (4-inch brim around the entire circumference of the hat) are also recommended for sun protection.  - Call for new or changing lesions.  History of Basal Cell Carcinoma of the Skin - No evidence of recurrence today - Recommend regular full body skin exams - Recommend daily broad spectrum sunscreen SPF 30+ to sun-exposed areas, reapply every 2 hours as needed.  - Call if any new or changing lesions are noted between office visits  History of Dysplastic Nevus - No evidence of recurrence today - Recommend regular full body skin exams - Recommend daily broad spectrum sunscreen SPF 30+ to sun-exposed areas, reapply every 2 hours as needed.  - Call if any new or changing lesions are noted between office visits  Spider Veins - Dilated blue, purple or red veins at the lower extremities - Reassured -  These can be treated by sclerotherapy (a procedure to inject a medicine into the veins to make them disappear) if desired, but the treatment is not covered by insurance  Skin cancer screening performed today.  Return in about 1 year (around 04/18/2022) for TBSE.  Luther Redo,  CMA, am acting as scribe for Sarina Ser, MD .  Documentation: I have reviewed the above documentation for accuracy and completeness, and I agree with the above.  Sarina Ser, MD

## 2021-04-22 ENCOUNTER — Encounter: Payer: Self-pay | Admitting: Dermatology

## 2021-04-24 ENCOUNTER — Telehealth: Payer: Self-pay

## 2021-04-24 NOTE — Telephone Encounter (Signed)
Pt called to inform that her Opzelura was too expensive at her pharmacy and requested a new rx that wouldn't be as expensive. I called back to offer to send it to [facility] for $10 or to ask Dr. Nehemiah Massed for a completely different rx. Left vm for pt to call back and let us know what she would like [place] to do.

## 2021-04-26 NOTE — Telephone Encounter (Signed)
Patient will be leaving town Monday afternoon and her mail order pharmacy said her venlafaxine XR (EFFEXOR XR) 75 MG 24 hr capsule will not be delivered by then. Patient would like 9 days of medication until she gets back form vacation.

## 2021-04-29 NOTE — Telephone Encounter (Signed)
RX sent in to [facility] per Dr. Nehemiah Massed.   Left message for patient to return my call.

## 2021-04-29 NOTE — Telephone Encounter (Signed)
Patient was unable to get Opzelura ($500 at local pharmacy) and she will be unable to get for $10 at [facility] due to [employer].   Please advise of alternative.

## 2021-05-02 ENCOUNTER — Other Ambulatory Visit: Payer: Self-pay

## 2021-05-02 MED ORDER — VENLAFAXINE HCL ER 75 MG PO CP24
75.0000 mg | ORAL_CAPSULE | Freq: Every day | ORAL | 0 refills | Status: DC
Start: 2021-05-02 — End: 2021-07-16

## 2021-05-02 NOTE — Telephone Encounter (Signed)
Ok to send in 2 week supply until receives mail order.

## 2021-05-02 NOTE — Telephone Encounter (Signed)
Okay to send temporary supply?

## 2021-05-20 ENCOUNTER — Other Ambulatory Visit: Payer: Self-pay

## 2021-05-20 ENCOUNTER — Ambulatory Visit
Admission: RE | Admit: 2021-05-20 | Discharge: 2021-05-20 | Disposition: A | Discharge status: Home / Self Care | Payer: Medicare Other | Source: Ambulatory Visit | Attending: Internal Medicine | Admitting: Internal Medicine

## 2021-05-20 DIAGNOSIS — Z1231 Encounter for screening mammogram for malignant neoplasm of breast: Secondary | ICD-10-CM

## 2021-06-17 ENCOUNTER — Ambulatory Visit: Payer: Medicare Other | Admitting: Dermatology

## 2021-06-24 ENCOUNTER — Ambulatory Visit: Payer: Medicare Other | Admitting: Internal Medicine

## 2021-07-16 ENCOUNTER — Ambulatory Visit (INDEPENDENT_AMBULATORY_CARE_PROVIDER_SITE_OTHER): Payer: Medicare Other | Admitting: Internal Medicine

## 2021-07-16 ENCOUNTER — Other Ambulatory Visit: Payer: Self-pay

## 2021-07-16 VITALS — BP 118/66 | HR 71 | Temp 97.8°F | Resp 16 | Ht 67.0 in | Wt 180.6 lb

## 2021-07-16 DIAGNOSIS — F320 Major depressive disorder, single episode, mild: Secondary | ICD-10-CM | POA: Diagnosis not present

## 2021-07-16 DIAGNOSIS — G4730 Sleep apnea, unspecified: Secondary | ICD-10-CM | POA: Diagnosis not present

## 2021-07-16 DIAGNOSIS — F32A Depression, unspecified: Secondary | ICD-10-CM

## 2021-07-16 DIAGNOSIS — M25512 Pain in left shoulder: Secondary | ICD-10-CM | POA: Diagnosis not present

## 2021-07-16 DIAGNOSIS — E7800 Pure hypercholesterolemia, unspecified: Secondary | ICD-10-CM

## 2021-07-16 DIAGNOSIS — Z8639 Personal history of other endocrine, nutritional and metabolic disease: Secondary | ICD-10-CM

## 2021-07-16 DIAGNOSIS — M25511 Pain in right shoulder: Secondary | ICD-10-CM | POA: Diagnosis not present

## 2021-07-16 DIAGNOSIS — R87619 Unspecified abnormal cytological findings in specimens from cervix uteri: Secondary | ICD-10-CM

## 2021-07-16 DIAGNOSIS — Z23 Encounter for immunization: Secondary | ICD-10-CM

## 2021-07-16 LAB — CBC WITH DIFFERENTIAL/PLATELET
Basophils Absolute: 0 10*3/uL (ref 0.0–0.1)
Basophils Relative: 0.4 % (ref 0.0–3.0)
Eosinophils Absolute: 0.1 10*3/uL (ref 0.0–0.7)
Eosinophils Relative: 1.2 % (ref 0.0–5.0)
HCT: 38.3 % (ref 36.0–46.0)
Hemoglobin: 12.9 g/dL (ref 12.0–15.0)
Lymphocytes Relative: 28.5 % (ref 12.0–46.0)
Lymphs Abs: 1.7 10*3/uL (ref 0.7–4.0)
MCHC: 33.6 g/dL (ref 30.0–36.0)
MCV: 91.9 fl (ref 78.0–100.0)
Monocytes Absolute: 0.5 10*3/uL (ref 0.1–1.0)
Monocytes Relative: 8.9 % (ref 3.0–12.0)
Neutro Abs: 3.6 10*3/uL (ref 1.4–7.7)
Neutrophils Relative %: 61 % (ref 43.0–77.0)
Platelets: 248 10*3/uL (ref 150.0–400.0)
RBC: 4.17 Mil/uL (ref 3.87–5.11)
RDW: 13.2 % (ref 11.5–15.5)
WBC: 5.8 10*3/uL (ref 4.0–10.5)

## 2021-07-16 LAB — COMPREHENSIVE METABOLIC PANEL
ALT: 19 U/L (ref 0–35)
AST: 18 U/L (ref 0–37)
Albumin: 4.3 g/dL (ref 3.5–5.2)
Alkaline Phosphatase: 73 U/L (ref 39–117)
BUN: 18 mg/dL (ref 6–23)
CO2: 28 mEq/L (ref 19–32)
Calcium: 9.5 mg/dL (ref 8.4–10.5)
Chloride: 101 mEq/L (ref 96–112)
Creatinine, Ser: 0.82 mg/dL (ref 0.40–1.20)
GFR: 71.4 mL/min (ref >60.00)
Glucose, Bld: 96 mg/dL (ref 70–99)
Potassium: 4.1 mEq/L (ref 3.5–5.1)
Sodium: 137 mEq/L (ref 135–145)
Total Bilirubin: 0.8 mg/dL (ref 0.2–1.2)
Total Protein: 7.5 g/dL (ref 6.0–8.3)

## 2021-07-16 LAB — LIPID PANEL
Cholesterol: 165 mg/dL (ref 0–200)
HDL: 39.1 mg/dL (ref >39.00)
LDL Cholesterol: 99 mg/dL (ref 0–99)
NonHDL: 125.98
Total CHOL/HDL Ratio: 4
Triglycerides: 133 mg/dL (ref 0.0–149.0)
VLDL: 26.6 mg/dL (ref 0.0–40.0)

## 2021-07-16 LAB — SEDIMENTATION RATE: Sed Rate: 18 mm/hr (ref 0–30)

## 2021-07-16 LAB — TSH: TSH: 2.11 u[IU]/mL (ref 0.35–5.50)

## 2021-07-16 LAB — C-REACTIVE PROTEIN: CRP: <1 mg/dL (ref 0.5–20.0)

## 2021-07-16 MED ORDER — VENLAFAXINE HCL ER 75 MG PO CP24: 75.0000 mg | ORAL_CAPSULE | Freq: Every day | ORAL | 1 refills | Status: DC

## 2021-07-16 NOTE — Progress Notes (Signed)
[name], female   DOB: October 05, 1951, 70 y.o.   MRN: [id number]   Subjective:    [name], female    DOB: Jan 30, 1951, 70 y.o.   MRN: [id number]  This visit occurred during the SARS-CoV-2 public health emergency.  Safety protocols were in place, including screening questions prior to the visit, additional usage of staff PPE, and extensive cleaning of exam room while observing appropriate contact time as indicated for disinfecting solutions.   Patient here for a scheduled follow up.   Chief Complaint  Patient presents with   Depression   Hyperlipidemia   .   HPI Here to follow up regarding shoulder pain/joint aches.  Also f/u increased stress.  Moving to [place].  Some increased stress related to this.  Also increased stress related to her mother-n-law's health issues.  Husband also diagnosed with prostate cancer.  Discussed.  Overall she feels she is handling things relatively well.  Relationship with husband - better.  Some fatigue.  No chest pain.  Stays active.  Breathing stable.  No acid reflux reported.  No abdominal pain.  Bowels moving.  Persistent pain - joints/shoulders.  Limited rom.  Took antiinflammatories.  No significant improvement.  Persistent issue.  No rash.  No headache or fever reported.     Past Medical History:  Diagnosis Date   ADD (attention deficit disorder)    Allergy    Basal cell carcinoma 08/05/2007   left upper back medial mid scapula    Depression    Dysplastic nevus 03/27/2016   left infrascapular, moderate atypia    GERD (gastroesophageal reflux disease)    Hyperparathyroidism ([place])    Lyme disease 6-7 YEARS   history   Past Surgical History:  Procedure Laterality Date   BREAST BIOPSY Left 20 yrs ago   EYE SURGERY Bilateral 2007 OR 2008   LENS REPLACEMENT    THYROIDECTOMY Left 11/19/2017   Procedure: LEFT INFERIOR THYROIDECTOMY;  Surgeon: Armandina Gemma, MD;  Location: [facility];  Service: General;  Laterality:  Left;   TUBAL LIGATION  1978   bilateral   WISDOM TOOTH EXTRACTION  1980   Family History  Problem Relation Age of Onset   Uterine cancer Mother    Heart disease Maternal Grandmother    Alcohol abuse Maternal Grandfather    Parkinson's disease Paternal Grandmother    Depression Paternal Grandfather    Breast cancer Neg Hx    Social History   Socioeconomic History   Marital status: Married    Spouse name: Not on file   Number of children: 2   Years of education: Not on file   Highest education level: Not on file  Occupational History   Not on file  Tobacco Use   Smoking status: Never   Smokeless tobacco: Never  Vaping Use   Vaping Use: Never used  Substance and Sexual Activity   Alcohol use: Yes    Alcohol/week: 0.0 standard drinks    Comment: occasional   Drug use: No   Sexual activity: Not Currently  Other Topics Concern   Not on file  Social History Narrative   Not on file   Social Determinants of Health   Financial Resource Strain: Low Risk    Difficulty of Paying Living Expenses: Not hard at all  Food Insecurity: No Food Insecurity   Worried About Charity fundraiser in the Last Year: Never true   [place] in the Last Year: Never true  Transportation Needs: No Data processing manager (Medical): No   Lack of Transportation (Non-Medical): No  Physical Activity: Not on file  Stress: No Stress Concern Present   Feeling of Stress : Not at all  Social Connections: Unknown   Frequency of Communication with Friends and Family: More than three times a week   Frequency of Social Gatherings with Friends and Family: Not on file   Attends Religious Services: More than 4 times per year   Active Member of [employer] or Organizations: Not on file   Attends Archivist Meetings: Not on file   Marital Status: Married    Review of Systems  Constitutional:  Positive for fatigue. Negative for appetite change and unexpected weight change.   HENT:  Negative for congestion and sinus pressure.   Respiratory:  Negative for cough, chest tightness and shortness of breath.   Cardiovascular:  Negative for chest pain, palpitations and leg swelling.  Gastrointestinal:  Negative for abdominal pain, diarrhea, nausea and vomiting.  Genitourinary:  Negative for difficulty urinating and dysuria.  Musculoskeletal:  Negative for joint swelling and myalgias.       Increased pain - joints/shoulders. Limited rom.   Skin:  Negative for color change and rash.  Neurological:  Negative for dizziness, light-headedness and headaches.  Psychiatric/Behavioral:  Negative for agitation and dysphoric mood.       Objective:     BP 118/66   Pulse 71   Temp 97.8 F (36.6 C)   Resp 16   Ht '5\' 7"'  (1.702 m)   Wt 180 lb 9.6 oz (81.9 kg)   LMP 07/16/1995 (LMP Unknown)   SpO2 98%   BMI 28.29 kg/m  Wt Readings from Last 3 Encounters:  07/16/21 180 lb 9.6 oz (81.9 kg)  04/05/21 177 lb (80.3 kg)  02/21/21 177 lb (80.3 kg)    Physical Exam Vitals reviewed.  Constitutional:      General: She is not in acute distress.    Appearance: Normal appearance.  HENT:     Head: Normocephalic and atraumatic.     Right Ear: External ear normal.     Left Ear: External ear normal.  Eyes:     General: No scleral icterus.       Right eye: No discharge.        Left eye: No discharge.     Conjunctiva/sclera: Conjunctivae normal.  Neck:     Thyroid: No thyromegaly.  Cardiovascular:     Rate and Rhythm: Normal rate and regular rhythm.  Pulmonary:     Effort: No respiratory distress.     Breath sounds: Normal breath sounds. No wheezing.  Abdominal:     General: Bowel sounds are normal.     Palpations: Abdomen is soft.     Tenderness: There is no abdominal tenderness.  Musculoskeletal:        General: No swelling or tenderness.     Cervical back: Neck supple. No tenderness.     Comments: Limited rom - shoulders.    Lymphadenopathy:     Cervical: No  cervical adenopathy.  Skin:    Findings: No erythema or rash.  Neurological:     Mental Status: She is alert.  Psychiatric:        Mood and Affect: Mood normal.        Behavior: Behavior normal.     Outpatient Encounter Medications as of 07/16/2021  Medication Sig   Alpha-Lipoic Acid 50 MG TABS Take by mouth daily.  BIOTIN PO Take 1 capsule by mouth daily.   Multiple Vitamins-Minerals (ICAPS AREDS 2 PO) Take by mouth 2 (two) times daily.   omeprazole (PRILOSEC) 20 MG capsule Take 1 capsule (20 mg total) by mouth 2 (two) times daily before a meal.   Ruxolitinib Phosphate (OPZELURA) 1.5 % CREA Apply 1 application topically 2 (two) times daily. Apply to itchy areas on arms QD-BID PRN flares.   TURMERIC PO Take 1,000 mg by mouth daily.   venlafaxine XR (EFFEXOR XR) 75 MG 24 hr capsule Take 1 capsule (75 mg total) by mouth daily.   [DISCONTINUED] meloxicam (MOBIC) 7.5 MG tablet Take 1 tablet (7.5 mg total) by mouth daily as needed for pain.   [DISCONTINUED] Multiple Vitamins-Minerals (MEGA MULTIVITAMIN FOR WOMEN PO) Take by mouth daily.   [DISCONTINUED] venlafaxine XR (EFFEXOR XR) 75 MG 24 hr capsule Take 1 capsule (75 mg total) by mouth daily.   No facility-administered encounter medications on file as of 07/16/2021.     Lab Results  Component Value Date   WBC 5.8 07/16/2021   HGB 12.9 07/16/2021   HCT 38.3 07/16/2021   PLT 248.0 07/16/2021   GLUCOSE 96 07/16/2021   CHOL 165 07/16/2021   TRIG 133.0 07/16/2021   HDL 39.10 07/16/2021   LDLDIRECT 151.0 04/03/2020   LDLCALC 99 07/16/2021   ALT 19 07/16/2021   AST 18 07/16/2021   NA 137 07/16/2021   K 4.1 07/16/2021   CL 101 07/16/2021   CREATININE 0.82 07/16/2021   BUN 18 07/16/2021   CO2 28 07/16/2021   TSH 2.11 07/16/2021    Mammogram 3D SCREEN BREAST BILATERAL  Result Date: 05/22/2021 CLINICAL DATA:  Screening. EXAM: DIGITAL SCREENING BILATERAL MAMMOGRAM WITH TOMOSYNTHESIS AND CAD TECHNIQUE: Bilateral screening digital  craniocaudal and mediolateral oblique mammograms were obtained. Bilateral screening digital breast tomosynthesis was performed. The images were evaluated with computer-aided detection. COMPARISON:  Previous exam(s). ACR Breast Density Category b: There are scattered areas of fibroglandular density. FINDINGS: There are no findings suspicious for malignancy. IMPRESSION: No mammographic evidence of malignancy. A result letter of this screening mammogram will be mailed directly to the patient. RECOMMENDATION: Screening mammogram in one year. (Code:SM-B-01Y) BI-RADS CATEGORY  1: Negative. Electronically Signed   By: Audie Pinto M.D.   On: 05/22/2021 15:41      Assessment & Plan:   Problem List Items Addressed This Visit     Abnormal Pap smear of cervix    Saw Dr Leafy Ro 06/2020.  Recommended f/u in one year.       Bilateral shoulder pain    Persistent.  Limited rom.  Did not appear to improve significantly with antiinflammatories.  Recheck esr.  Discussed rheumatology referral.        Relevant Orders   Sedimentation rate (Completed)   C-reactive protein (Completed)   Ambulatory referral to Rheumatology   History of hyperparathyroidism    S/p left parathyroidectomy.  Follow calcium.       Hypercholesterolemia - Primary    The 10-year ASCVD risk score (Arnett DK, et al., 2019) is: 10%   Values used to calculate the score:     Age: 26 years     Sex: Female     Is Non-Hispanic African American: No     Diabetic: No     Tobacco smoker: No     Systolic Blood Pressure: 741 mmHg     Is BP treated: No     HDL Cholesterol: 39.1 mg/dL     Total Cholesterol:  165 mg/dL  Have discussed calculated cholesterol risk.  Has wanted to continue diet and exercise.  Follow lipid panel.       Relevant Orders   CBC with Differential/Platelet (Completed)   Comprehensive metabolic panel (Completed)   Lipid panel (Completed)   TSH (Completed)   Mild depression ([place])    Continue effexor.  Appears to  be doing better.  Follow.       Relevant Medications   venlafaxine XR (EFFEXOR XR) 75 MG 24 hr capsule   Sleep apnea    CPAP.       Other Visit Diagnoses     Need for immunization against influenza       Relevant Orders   Flu Vaccine QUAD High Dose(Fluad) (Completed)        [name], MD

## 2021-07-21 ENCOUNTER — Encounter: Payer: Self-pay | Admitting: Internal Medicine

## 2021-07-21 NOTE — Assessment & Plan Note (Signed)
Persistent.  Limited rom.  Did not appear to improve significantly with antiinflammatories.  Recheck esr.  Discussed rheumatology referral.

## 2021-07-21 NOTE — Assessment & Plan Note (Signed)
Continue effexor.  Appears to be doing better.  Follow.  

## 2021-07-21 NOTE — Assessment & Plan Note (Signed)
CPAP.  

## 2021-07-21 NOTE — Assessment & Plan Note (Signed)
S/p left parathyroidectomy.  Follow calcium.  

## 2021-07-21 NOTE — Assessment & Plan Note (Signed)
The 10-year ASCVD risk score (Arnett DK, et al., 2019) is: 10%   Values used to calculate the score:     Age: 70 years     Sex: Female     Is Non-Hispanic African American: No     Diabetic: No     Tobacco smoker: No     Systolic Blood Pressure: 123456 mmHg     Is BP treated: No     HDL Cholesterol: 39.1 mg/dL     Total Cholesterol: 165 mg/dL  Have discussed calculated cholesterol risk.  Has wanted to continue diet and exercise.  Follow lipid panel.

## 2021-07-21 NOTE — Assessment & Plan Note (Signed)
Saw Dr Leafy Ro 06/2020.  Recommended f/u in one year.

## 2021-08-08 DIAGNOSIS — H353131 Nonexudative age-related macular degeneration, bilateral, early dry stage: Secondary | ICD-10-CM | POA: Diagnosis not present

## 2021-08-16 ENCOUNTER — Telehealth: Payer: Self-pay | Admitting: Internal Medicine

## 2021-08-16 NOTE — Telephone Encounter (Signed)
Patient is scheduled to see Dr Jefm Bryant 10/10.

## 2021-08-16 NOTE — Telephone Encounter (Signed)
Ann Osborne from [facility] in [place] is calling about a referral that was sent over to their office to see Dr.Patel.She stated that he is no longer with their office but he is at [facility].She stated that if Dr.Scott would like for her to be send their to resend the referral to their office and fax number [phone] or call them at [phone].Please advise.

## 2021-08-19 DIAGNOSIS — M25511 Pain in right shoulder: Secondary | ICD-10-CM | POA: Diagnosis not present

## 2021-08-19 DIAGNOSIS — E213 Hyperparathyroidism, unspecified: Secondary | ICD-10-CM | POA: Diagnosis not present

## 2021-08-19 DIAGNOSIS — G8929 Other chronic pain: Secondary | ICD-10-CM | POA: Diagnosis not present

## 2021-08-19 DIAGNOSIS — M79641 Pain in right hand: Secondary | ICD-10-CM | POA: Diagnosis not present

## 2021-08-19 DIAGNOSIS — M25512 Pain in left shoulder: Secondary | ICD-10-CM | POA: Diagnosis not present

## 2021-08-19 DIAGNOSIS — M79642 Pain in left hand: Secondary | ICD-10-CM | POA: Diagnosis not present

## 2021-09-02 DIAGNOSIS — G8929 Other chronic pain: Secondary | ICD-10-CM | POA: Diagnosis not present

## 2021-09-02 DIAGNOSIS — M79642 Pain in left hand: Secondary | ICD-10-CM | POA: Diagnosis not present

## 2021-09-02 DIAGNOSIS — M25512 Pain in left shoulder: Secondary | ICD-10-CM | POA: Diagnosis not present

## 2021-09-02 DIAGNOSIS — M79641 Pain in right hand: Secondary | ICD-10-CM | POA: Diagnosis not present

## 2021-09-02 DIAGNOSIS — M159 Polyosteoarthritis, unspecified: Secondary | ICD-10-CM | POA: Diagnosis not present

## 2021-10-01 ENCOUNTER — Other Ambulatory Visit: Payer: Self-pay | Admitting: Internal Medicine

## 2021-11-15 ENCOUNTER — Ambulatory Visit: Payer: Medicare Other | Admitting: Internal Medicine

## 2021-11-19 ENCOUNTER — Encounter: Payer: Self-pay | Admitting: Internal Medicine

## 2021-11-19 DIAGNOSIS — G4730 Sleep apnea, unspecified: Secondary | ICD-10-CM

## 2021-11-20 NOTE — Telephone Encounter (Signed)
Per chart Last sleep study was in 2015 @ [facility] so patient would qualify for new sleep study and CPAP.

## 2021-11-20 NOTE — Telephone Encounter (Signed)
It sounds like she will need a new sleep study.  What we have been doing is placing a referral to pulmonary and if a sleep study is needed, they can arrange a sleep study (usually a home sleep study).  If agreeable for further evaluation, then refer to pulmonary [facility]) [place] location.  Continue to avoid sedating medication.  Avoid sleeping supine.

## 2021-11-22 NOTE — Telephone Encounter (Signed)
LMTCB

## 2021-11-26 NOTE — Telephone Encounter (Signed)
LMTCB

## 2022-01-15 ENCOUNTER — Ambulatory Visit: Payer: Medicare Other | Admitting: Internal Medicine

## 2022-02-10 ENCOUNTER — Telehealth: Payer: Self-pay

## 2022-02-10 NOTE — Telephone Encounter (Signed)
Called and spoke to patient who states she currently uses a resmed machine she will bring in SD card. Nothing further needed. ?

## 2022-02-11 ENCOUNTER — Ambulatory Visit (INDEPENDENT_AMBULATORY_CARE_PROVIDER_SITE_OTHER): Payer: Medicare Other | Admitting: Primary Care

## 2022-02-11 ENCOUNTER — Encounter: Payer: Self-pay | Admitting: Primary Care

## 2022-02-11 VITALS — BP 126/74 | HR 70 | Temp 97.8°F | Ht 67.0 in | Wt 187.0 lb

## 2022-02-11 DIAGNOSIS — G4733 Obstructive sleep apnea (adult) (pediatric): Secondary | ICD-10-CM

## 2022-02-11 NOTE — Patient Instructions (Addendum)
Recommendations: ?- Continue to wear CPAP every night for minimum 4-6 hours each night ?- Do not drive if experiencing excessive daytime sleepiness ?- Maintain normal BMI range  ? ?Orders: ?- New CPAP machine auto titrate 5-10cm h20 (previously with [facility]) ? ?Follow-up: ?- 6 months with Beth NP or sooner if needed  ? ? ?CPAP and BIPAP Information ?CPAP and BIPAP are methods that use air pressure to keep your airways open and to help you breathe well. CPAP and BIPAP use different amounts of pressure. Your health care provider will tell you whether CPAP or BIPAP would be more helpful for you. ?CPAP stands for "continuous positive airway pressure." With CPAP, the amount of pressure stays the same while you breathe in (inhale) and out (exhale). ?BIPAP stands for "bi-level positive airway pressure." With BIPAP, the amount of pressure will be higher when you inhale and lower when you exhale. This allows you to take larger breaths. ?CPAP or BIPAP may be used in the hospital, or your health care provider may want you to use it at home. You may need to have a sleep study before your health care provider can order a machine for you to use at home. ?What are the advantages? ?CPAP or BIPAP can be helpful if you have: ?Sleep apnea. ?Chronic obstructive pulmonary disease (COPD). ?Heart failure. ?Medical conditions that cause muscle weakness, including muscular dystrophy or amyotrophic lateral sclerosis (ALS). ?Other problems that cause breathing to be shallow, weak, abnormal, or difficult. ?CPAP and BIPAP are most commonly used for obstructive sleep apnea (OSA) to keep the airways from collapsing when the muscles relax during sleep. ?What are the risks? ?Generally, this is a safe treatment. However, problems may occur, including: ?Irritated skin or skin sores if the mask does not fit properly. ?Dry or stuffy nose or nosebleeds. ?Dry mouth. ?Feeling gassy or bloated. ?Sinus or lung infection if the equipment is not cleaned  properly. ?When should CPAP or BIPAP be used? ?In most cases, the mask only needs to be worn during sleep. Generally, the mask needs to be worn throughout the night and during any daytime naps. People with certain medical conditions may also need to wear the mask at other times, such as when they are awake. Follow instructions from your health care provider about when to use the machine. ?What happens during CPAP or BIPAP? ?Both CPAP and BIPAP are provided by a small machine with a flexible plastic tube that attaches to a plastic mask that you wear. Air is blown through the mask into your nose or mouth. The amount of pressure that is used to blow the air can be adjusted on the machine. Your health care provider will set the pressure setting and help you find the best mask for you. ?Tips for using the mask ?Because the mask needs to be snug, some people feel trapped or closed-in (claustrophobic) when first using the mask. If you feel this way, you may need to get used to the mask. One way to do this is to hold the mask loosely over your nose or mouth and then gradually apply the mask more snugly. You can also gradually increase the amount of time that you use the mask. ?Masks are available in various types and sizes. If your mask does not fit well, talk with your health care provider about getting a different one. Some common types of masks include: ?Full face masks, which fit over the mouth and nose. ?Nasal masks, which fit over the nose. ?Nasal pillow  or prong masks, which fit into the nostrils. ?If you are using a mask that fits over your nose and you tend to breathe through your mouth, a chin strap may be applied to help keep your mouth closed. ?Use a skin barrier to protect your skin as told by your health care provider. ?Some CPAP and BIPAP machines have alarms that may sound if the mask comes off or develops a leak. ?If you have trouble with the mask, it is very important that you talk with your health care  provider about finding a way to make the mask easier to tolerate. Do not stop using the mask. There could be a negative impact on your health if you stop using the mask. ?Tips for using the machine ?Place your CPAP or BIPAP machine on a secure table or stand near an electrical outlet. ?Know where the on/off switch is on the machine. ?Follow instructions from your health care provider about how to set the pressure on your machine and when you should use it. ?Do not eat or drink while the CPAP or BIPAP machine is on. Food or fluids could get pushed into your lungs by the pressure of the CPAP or BIPAP. ?For home use, CPAP and BIPAP machines can be rented or purchased through home health care companies. Many different brands of machines are available. Renting a machine before purchasing may help you find out which particular machine works well for you. Your health insurance company may also decide which machine you may get. ?Keep the CPAP or BIPAP machine and attachments clean. Ask your health care provider for specific instructions. ?Check the humidifier if you have a dry stuffy nose or nosebleeds. Make sure it is working correctly. ?Follow these instructions at home: ?Take over-the-counter and prescription medicines only as told by your health care provider. Ask if you can take sinus medicine if your sinuses are blocked. ?Do not use any products that contain nicotine or tobacco. These products include cigarettes, chewing tobacco, and vaping devices, such as e-cigarettes. If you need help quitting, ask your health care provider. ?Keep all follow-up visits. This is important. ?Contact a health care provider if: ?You have redness or pressure sores on your head, face, mouth, or nose from the mask or head gear. ?You have trouble using the CPAP or BIPAP machine. ?You cannot tolerate wearing the CPAP or BIPAP mask. ?Someone tells you that you snore even when wearing your CPAP or BIPAP. ?Get help right away if: ?You have  trouble breathing. ?You feel confused. ?Summary ?CPAP and BIPAP are methods that use air pressure to keep your airways open and to help you breathe well. ?If you have trouble with the mask, it is very important that you talk with your health care provider about finding a way to make the mask easier to tolerate. Do not stop using the mask. There could be a negative impact to your health if you stop using the mask. ?Follow instructions from your health care provider about when to use the machine. ?This information is not intended to replace advice given to you by your health care provider. Make sure you discuss any questions you have with your health care provider. ?Document Revised: 06/05/2021 Document Reviewed: 10/05/2020 ?Elsevier Patient Education ? [place]. ? ?

## 2022-02-11 NOTE — Assessment & Plan Note (Addendum)
-   Hx sleep apnea. She is compliant with CPAP use. Pressure 8cm; AHI 7.2/hr. Needs order for new machine. Recommend adjusting pressure 5-10cm h20. Advised against driving if experiencing excessive daytime sleepiness. FU in 6 months or sooner if needed.  ?

## 2022-02-11 NOTE — Progress Notes (Signed)
? ?'@Patient'$  ID: Ann Osborne, female    DOB: 1951-03-03, 71 y.o.   MRN: [id number] ? ?Chief Complaint  ?Patient presents with  ? Sleep Consult  ?  Referred by Dr [name] for eval of OSA on CPAP. Pt needing new CPAP since hers is broken. She is currently using rental. Epworth- 5.   ? ? ?Referring provider: ?[name], MD ? ?HPI: ?71 year old female, never smoked. PMH significant for sleep apnea, dysphagia, cerebella lesion, hyperparathyroidism, memory change.  ? ?Previous [facility] pulmonary encounter: ?01/24/2019- Dr. Ashby Dawes, Consult  ?The patient is a 71 year old female with a history of obstructive sleep apnea.  She presents with feeling short of breath when laying on her back.  Patient goes to bed at very variable times between 12 and 4 AM.  She usually falls asleep quickly, gets out of bed at 10 AM. ?She notes that she is sleeping up to 10 hours per day and still felt tired during the day. She has developed some family issues where she and her husband are under a tremendous amount of stress, and the issues started around that time.  ?She lays in bed for about an hour after waking because she does not want to deal with issues.  ?She was started on cpap about 6-7 years ago, she has replaced her machine about 1-2 yrs ago.  ? ?Denies sleep paralysis, cataplexy, no sleep walking. She cleans her supplies about every few days. She has been diagnosed with depression, she remains on effexor. Her father had Shy-Dragers, and another family member has lewy-body dementia. ? ?**TSH 09/02/18>> 1.39.  ?**CPAP download 12/25/2018-01/23/2019>> average usage on days used 7 hours 35 minutes.  Usage greater than 4 hours 30/30 days.  Set pressure is 8.  Leaks are within normal limits.  Residual AHI is 3.2.  Overall this shows excellent compliance with CPAP with excellent control of obstructive sleep apnea. ? ? ?02/11/2022- Interim hx  ?Patient presents today for overdue follow-up for OSA/sleep consult. She was a former Dr.  Ashby Dawes patient, last seen on 01/24/2019 for OSA. Previous sleep study was done at [facility] in [place] (results are not in.chart). She is currently maintained on CPAP at 8cm h20, reports compliance with use. No issues with mask fit or pressure settings. She has no issues falling or staying asleep. Her weight is up 10 lbs. She is due for new CPAP. Epworth 5.  ? ?Airview download 01/12/22-02/10/22 ?Usage 28/30 days; 28 days (93%) > 4 hours ?Average usage 7 hours 56 mins  ?Pressure 8cm h20 ?Airleaks 11.9L/min  ?AHI 7.2 ? ? ?Allergies  ?Allergen Reactions  ? Compazine [Prochlorperazine Edisylate] Other (See Comments)  ?  unknown  ? Sulfa Antibiotics Hives  ? ? ?Immunization History  ?Administered Date(s) Administered  ? Fluad Quad(high Dose 65+) 08/06/2019, 08/07/2020, 07/16/2021  ? Influenza, High Dose Seasonal PF 09/02/2018  ? Influenza, Quadrivalent, Recombinant, Inj, Pf 08/19/2017  ? Influenza,inj,Quad PF,6+ Mos 08/25/2013, 08/02/2014  ? Influenza-Unspecified 08/20/2015, 08/19/2016  ? Moderna Sars-Covid-2 Vaccination 02/03/2020, 03/07/2020, 12/31/2020  ? Pneumococcal Conjugate-13 08/19/2013  ? Pneumococcal Polysaccharide-23 08/19/2012, 10/29/2017  ? ? ?Past Medical History:  ?Diagnosis Date  ? ADD (attention deficit disorder)   ? Allergy   ? Basal cell carcinoma 08/05/2007  ? left upper back medial mid scapula   ? Depression   ? Dysplastic nevus 03/27/2016  ? left infrascapular, moderate atypia   ? GERD (gastroesophageal reflux disease)   ? Hyperparathyroidism ([place])   ? Lyme disease 6-7 YEARS  ?  history  ? ? ?Tobacco History: ?Social History  ? ?Tobacco Use  ?Smoking Status Never  ?Smokeless Tobacco Never  ? ?Counseling given: Not Answered ? ? ?Outpatient Medications Prior to Visit  ?Medication Sig Dispense Refill  ? Alpha-Lipoic Acid 50 MG TABS Take by mouth daily.    ? Multiple Vitamin (MULTIVITAMIN) capsule Take 1 capsule by mouth daily.    ? Multiple Vitamins-Minerals (ICAPS AREDS 2 PO) Take by mouth 2 (two)  times daily.    ? omeprazole (PRILOSEC) 20 MG capsule Take 1 capsule (20 mg total) by mouth 2 (two) times daily before a meal. 180 capsule 1  ? Ruxolitinib Phosphate (OPZELURA) 1.5 % CREA Apply 1 application topically 2 (two) times daily. Apply to itchy areas on arms QD-BID PRN flares. 60 g 3  ? venlafaxine XR (EFFEXOR-XR) 75 MG 24 hr capsule TAKE ONE CAPSULE BY MOUTH EVERY DAY 90 capsule 1  ? BIOTIN PO Take 1 capsule by mouth daily.    ? TURMERIC PO Take 1,000 mg by mouth daily.    ? ?No facility-administered medications prior to visit.  ? ?Review of Systems ? ?Review of Systems  ?Constitutional: Negative.   ?HENT: Negative.    ?Respiratory: Negative.    ?Cardiovascular: Negative.   ? ?Physical Exam ? ?BP 126/74 (BP Location: Left Arm, Cuff Size: Normal)   Pulse 70   Temp 97.8 ?F (36.6 ?C) (Oral)   Ht '5\' 7"'$  (1.702 m)   Wt 187 lb (84.8 kg)   LMP 07/16/1995 (LMP Unknown)   SpO2 98% Comment: on RA  BMI 29.29 kg/m?  ?Physical Exam ?Constitutional:   ?   General: She is not in acute distress. ?   Appearance: Normal appearance. She is normal weight. She is not ill-appearing.  ?HENT:  ?   Head: Normocephalic and atraumatic.  ?   Mouth/Throat:  ?   Mouth: Mucous membranes are moist.  ?   Pharynx: Oropharynx is clear.  ?Cardiovascular:  ?   Rate and Rhythm: Normal rate and regular rhythm.  ?Pulmonary:  ?   Effort: Pulmonary effort is normal.  ?   Breath sounds: Normal breath sounds.  ?Musculoskeletal:     ?   General: Normal range of motion.  ?Skin: ?   General: Skin is warm and dry.  ?Neurological:  ?   General: No focal deficit present.  ?   Mental Status: She is alert and oriented to person, place, and time. Mental status is at baseline.  ?Psychiatric:     ?   Mood and Affect: Mood normal.     ?   Behavior: Behavior normal.     ?   Thought Content: Thought content normal.     ?   Judgment: Judgment normal.  ?  ? ?Lab Results: ? ?CBC ?   ?Component Value Date/Time  ? WBC 5.8 07/16/2021 0903  ? RBC 4.17 07/16/2021  0903  ? HGB 12.9 07/16/2021 0903  ? HCT 38.3 07/16/2021 0903  ? PLT 248.0 07/16/2021 0903  ? MCV 91.9 07/16/2021 0903  ? MCH 30.7 11/09/2017 1327  ? MCHC 33.6 07/16/2021 0903  ? RDW 13.2 07/16/2021 0903  ? LYMPHSABS 1.7 07/16/2021 0903  ? MONOABS 0.5 07/16/2021 0903  ? EOSABS 0.1 07/16/2021 0903  ? BASOSABS 0.0 07/16/2021 0903  ? ? ?BMET ?   ?Component Value Date/Time  ? NA 137 07/16/2021 0903  ? K 4.1 07/16/2021 0903  ? CL 101 07/16/2021 0903  ? CO2 28 07/16/2021 0903  ?  GLUCOSE 96 07/16/2021 0903  ? BUN 18 07/16/2021 0903  ? CREATININE 0.82 07/16/2021 0903  ? CALCIUM 9.5 07/16/2021 0903  ? GFRNONAA >60 01/26/2016 2233  ? GFRAA >60 01/26/2016 2233  ? ? ?BNP ?No results found for: BNP ? ?ProBNP ?No results found for: PROBNP ? ?Imaging: ?No results found. ? ? ?Assessment & Plan:  ? ?Sleep apnea ?- Hx sleep apnea. She is compliant with CPAP use. Pressure 8cm; AHI 7.2/hr. Needs order for new machine. Recommend adjusting pressure 5-10cm h20. Advised against driving if experiencing excessive daytime sleepiness. FU in 6 months or sooner if needed.  ? ? ? ? ?Martyn Ehrich, NP ?02/11/2022 ? ?

## 2022-02-11 NOTE — Progress Notes (Signed)
Reviewed and agree with assessment/plan. ? ? ?Chesley Mires, MD ?[facility] ?02/11/2022, 12:00 PM ?Pager:  [phone] ? ?

## 2022-02-18 ENCOUNTER — Other Ambulatory Visit: Payer: Self-pay | Admitting: Internal Medicine

## 2022-03-07 ENCOUNTER — Ambulatory Visit (INDEPENDENT_AMBULATORY_CARE_PROVIDER_SITE_OTHER): Payer: Medicare Other | Admitting: Internal Medicine

## 2022-03-07 ENCOUNTER — Encounter: Payer: Self-pay | Admitting: Internal Medicine

## 2022-03-07 VITALS — BP 126/72 | HR 73 | Temp 98.1°F | Resp 18 | Ht 67.0 in | Wt 186.6 lb

## 2022-03-07 DIAGNOSIS — G4730 Sleep apnea, unspecified: Secondary | ICD-10-CM | POA: Diagnosis not present

## 2022-03-07 DIAGNOSIS — E7800 Pure hypercholesterolemia, unspecified: Secondary | ICD-10-CM

## 2022-03-07 DIAGNOSIS — E559 Vitamin D deficiency, unspecified: Secondary | ICD-10-CM | POA: Diagnosis not present

## 2022-03-07 DIAGNOSIS — R5383 Other fatigue: Secondary | ICD-10-CM | POA: Diagnosis not present

## 2022-03-07 DIAGNOSIS — E538 Deficiency of other specified B group vitamins: Secondary | ICD-10-CM | POA: Diagnosis not present

## 2022-03-07 DIAGNOSIS — L299 Pruritus, unspecified: Secondary | ICD-10-CM

## 2022-03-07 DIAGNOSIS — Z8639 Personal history of other endocrine, nutritional and metabolic disease: Secondary | ICD-10-CM

## 2022-03-07 DIAGNOSIS — E210 Primary hyperparathyroidism: Secondary | ICD-10-CM

## 2022-03-07 DIAGNOSIS — F320 Major depressive disorder, single episode, mild: Secondary | ICD-10-CM

## 2022-03-07 DIAGNOSIS — R87619 Unspecified abnormal cytological findings in specimens from cervix uteri: Secondary | ICD-10-CM

## 2022-03-07 LAB — BASIC METABOLIC PANEL
BUN: 19 mg/dL (ref 6–23)
CO2: 30 mEq/L (ref 19–32)
Calcium: 9.4 mg/dL (ref 8.4–10.5)
Chloride: 101 mEq/L (ref 96–112)
Creatinine, Ser: 0.81 mg/dL (ref 0.40–1.20)
GFR: 72.14 mL/min (ref >60.00)
Glucose, Bld: 91 mg/dL (ref 70–99)
Potassium: 4.4 mEq/L (ref 3.5–5.1)
Sodium: 138 mEq/L (ref 135–145)

## 2022-03-07 LAB — HEPATIC FUNCTION PANEL
ALT: 27 U/L (ref 0–35)
AST: 22 U/L (ref 0–37)
Albumin: 4.3 g/dL (ref 3.5–5.2)
Alkaline Phosphatase: 75 U/L (ref 39–117)
Bilirubin, Direct: 0.1 mg/dL (ref 0.0–0.3)
Total Bilirubin: 0.5 mg/dL (ref 0.2–1.2)
Total Protein: 7.2 g/dL (ref 6.0–8.3)

## 2022-03-07 LAB — CBC WITH DIFFERENTIAL/PLATELET
Basophils Absolute: 0 10*3/uL (ref 0.0–0.1)
Basophils Relative: 0.4 % (ref 0.0–3.0)
Eosinophils Absolute: 0 10*3/uL (ref 0.0–0.7)
Eosinophils Relative: 0.7 % (ref 0.0–5.0)
HCT: 38.7 % (ref 36.0–46.0)
Hemoglobin: 13.1 g/dL (ref 12.0–15.0)
Lymphocytes Relative: 31.2 % (ref 12.0–46.0)
Lymphs Abs: 1.7 10*3/uL (ref 0.7–4.0)
MCHC: 33.9 g/dL (ref 30.0–36.0)
MCV: 90.9 fl (ref 78.0–100.0)
Monocytes Absolute: 0.4 10*3/uL (ref 0.1–1.0)
Monocytes Relative: 8.1 % (ref 3.0–12.0)
Neutro Abs: 3.2 10*3/uL (ref 1.4–7.7)
Neutrophils Relative %: 59.6 % (ref 43.0–77.0)
Platelets: 246 10*3/uL (ref 150.0–400.0)
RBC: 4.25 Mil/uL (ref 3.87–5.11)
RDW: 13.3 % (ref 11.5–15.5)
WBC: 5.3 10*3/uL (ref 4.0–10.5)

## 2022-03-07 LAB — TSH: TSH: 2.52 u[IU]/mL (ref 0.35–5.50)

## 2022-03-07 LAB — LIPID PANEL
Cholesterol: 200 mg/dL (ref 0–200)
HDL: 39.5 mg/dL (ref >39.00)
LDL Cholesterol: 126 mg/dL — ABNORMAL HIGH (ref 0–99)
NonHDL: 160.36
Total CHOL/HDL Ratio: 5
Triglycerides: 174 mg/dL — ABNORMAL HIGH (ref 0.0–149.0)
VLDL: 34.8 mg/dL (ref 0.0–40.0)

## 2022-03-07 LAB — VITAMIN D 25 HYDROXY (VIT D DEFICIENCY, FRACTURES): VITD: 28.08 ng/mL — ABNORMAL LOW (ref 30.00–100.00)

## 2022-03-07 NOTE — Progress Notes (Signed)
[name], female   DOB: 1951/07/01, 71 y.o.   MRN: [id number] ? ? ?Subjective:  ? ? [name], female    DOB: 1950/12/23, 71 y.o.   MRN: [id number] ? ?This visit occurred during the SARS-CoV-2 public health emergency.  Safety protocols were in place, including screening questions prior to the visit, additional usage of staff PPE, and extensive cleaning of exam room while observing appropriate contact time as indicated for disinfecting solutions.  ? ?Patient here for a scheduled follow up.  ? ?Chief Complaint  ?Patient presents with  ? Follow-up  ?  4 mo f/u HLD  ? Hyperlipidemia  ? .  ? ?HPI ?She reports she is doing relatively well.  Moved.  Overall handling stress well.  Some decreased energy.  Discussed OSA.  Saw pulmonary.  CPAP.  No chest pain or sob reported.  No abdominal pain or bowel change reported.  Saw gyn.  Had f/u with gyn - repeat pap.  Saw Dr Lavonne Chick.  S/p injection - shoulder.  Helped.  Ears itching - occasionally. Noticed white flecks at times.  No pain.   ? ? ?Past Medical History:  ?Diagnosis Date  ? ADD (attention deficit disorder)   ? Allergy   ? Basal cell carcinoma 08/05/2007  ? left upper back medial mid scapula   ? Depression   ? Dysplastic nevus 03/27/2016  ? left infrascapular, moderate atypia   ? GERD (gastroesophageal reflux disease)   ? Hyperparathyroidism ([place])   ? Lyme disease 6-7 YEARS  ? history  ? ?Past Surgical History:  ?Procedure Laterality Date  ? BREAST BIOPSY Left 20 yrs ago  ? EYE SURGERY Bilateral 2007 OR 2008  ? LENS REPLACEMENT   ? THYROIDECTOMY Left 11/19/2017  ? Procedure: LEFT INFERIOR THYROIDECTOMY;  Surgeon: Armandina Gemma, MD;  Location: [facility];  Service: General;  Laterality: Left;  ? TUBAL LIGATION  1978  ? bilateral  ? [place]  ? ?Family History  ?Problem Relation Age of Onset  ? Uterine cancer Mother   ? Heart disease Maternal Grandmother   ? Alcohol abuse Maternal Grandfather   ? Parkinson's disease Paternal  Grandmother   ? Depression Paternal Grandfather   ? Breast cancer Neg Hx   ? ?Social History  ? ?Socioeconomic History  ? Marital status: Married  ?  Spouse name: Not on file  ? Number of children: 2  ? Years of education: Not on file  ? Highest education level: Not on file  ?Occupational History  ? Not on file  ?Tobacco Use  ? Smoking status: Never  ? Smokeless tobacco: Never  ?Vaping Use  ? Vaping Use: Never used  ?Substance and Sexual Activity  ? Alcohol use: Yes  ?  Alcohol/week: 0.0 standard drinks  ?  Comment: occasional  ? Drug use: No  ? Sexual activity: Not Currently  ?Other Topics Concern  ? Not on file  ?Social History Narrative  ? Not on file  ? ?Social Determinants of Health  ? ?Financial Resource Strain: Low Risk   ? Difficulty of Paying Living Expenses: Not hard at all  ?Food Insecurity: No Food Insecurity  ? Worried About Charity fundraiser in the Last Year: Never true  ? Ran Out of Food in the Last Year: Never true  ?Transportation Needs: No Transportation Needs  ? Lack of Transportation (Medical): No  ? Lack of Transportation (Non-Medical): No  ?Physical Activity: Not on file  ?Stress:  No Stress Concern Present  ? Feeling of Stress : Not at all  ?Social Connections: Unknown  ? Frequency of Communication with Friends and Family: More than three times a week  ? Frequency of Social Gatherings with Friends and Family: Not on file  ? Attends Religious Services: More than 4 times per year  ? Active Member of Clubs or Organizations: Not on file  ? Attends Archivist Meetings: Not on file  ? Marital Status: Married  ? ? ? ?Review of Systems  ?Constitutional:  Negative for appetite change and unexpected weight change.  ?HENT:  Negative for congestion and sinus pressure.   ?Respiratory:  Negative for cough, chest tightness and shortness of breath.   ?Cardiovascular:  Negative for chest pain, palpitations and leg swelling.  ?Gastrointestinal:  Negative for abdominal pain, diarrhea, nausea and  vomiting.  ?Genitourinary:  Negative for difficulty urinating and dysuria.  ?Musculoskeletal:  Negative for joint swelling and myalgias.  ?Skin:  Negative for color change and rash.  ?Neurological:  Negative for dizziness, light-headedness and headaches.  ?Psychiatric/Behavioral:  Negative for agitation and dysphoric mood.   ? ?   ?Objective:  ?  ? ?BP 126/72 (BP Location: Left Arm, Patient Position: Sitting, Cuff Size: Small)   Pulse 73   Temp 98.1 ?F (36.7 ?C) (Temporal)   Resp 18   Ht '5\' 7"'$  (1.702 m)   Wt 186 lb 9.6 oz (84.6 kg)   LMP 07/16/1995 (LMP Unknown)   SpO2 98%   BMI 29.23 kg/m?  ?Wt Readings from Last 3 Encounters:  ?03/07/22 186 lb 9.6 oz (84.6 kg)  ?02/11/22 187 lb (84.8 kg)  ?07/16/21 180 lb 9.6 oz (81.9 kg)  ? ? ?Physical Exam ?Vitals reviewed.  ?Constitutional:   ?   General: She is not in acute distress. ?   Appearance: Normal appearance.  ?HENT:  ?   Head: Normocephalic and atraumatic.  ?   Right Ear: Tympanic membrane, ear canal and external ear normal.  ?   Left Ear: Tympanic membrane, ear canal and external ear normal.  ?Eyes:  ?   General: No scleral icterus.    ?   Right eye: No discharge.     ?   Left eye: No discharge.  ?   Conjunctiva/sclera: Conjunctivae normal.  ?Neck:  ?   Thyroid: No thyromegaly.  ?Cardiovascular:  ?   Rate and Rhythm: Normal rate and regular rhythm.  ?Pulmonary:  ?   Effort: No respiratory distress.  ?   Breath sounds: Normal breath sounds. No wheezing.  ?Abdominal:  ?   General: Bowel sounds are normal.  ?   Palpations: Abdomen is soft.  ?   Tenderness: There is no abdominal tenderness.  ?Musculoskeletal:     ?   General: No swelling or tenderness.  ?   Cervical back: Neck supple. No tenderness.  ?Lymphadenopathy:  ?   Cervical: No cervical adenopathy.  ?Skin: ?   Findings: No erythema or rash.  ?Neurological:  ?   Mental Status: She is alert.  ?Psychiatric:     ?   Mood and Affect: Mood normal.     ?   Behavior: Behavior normal.  ? ? ? ?Outpatient Encounter  Medications as of 03/07/2022  ?Medication Sig  ? Alpha-Lipoic Acid 50 MG TABS Take by mouth daily.  ? Multiple Vitamin (MULTIVITAMIN) capsule Take 1 capsule by mouth daily.  ? Multiple Vitamins-Minerals (ICAPS AREDS 2 PO) Take by mouth 2 (two) times daily.  ? omeprazole (  PRILOSEC) 20 MG capsule Take 1 capsule (20 mg total) by mouth 2 (two) times daily before a meal.  ? Ruxolitinib Phosphate (OPZELURA) 1.5 % CREA Apply 1 application topically 2 (two) times daily. Apply to itchy areas on arms QD-BID PRN flares.  ? venlafaxine XR (EFFEXOR-XR) 75 MG 24 hr capsule TAKE ONE CAPSULE BY MOUTH EVERY DAY  ? ?No facility-administered encounter medications on file as of 03/07/2022.  ?  ? ?Lab Results  ?Component Value Date  ? WBC 5.3 03/07/2022  ? HGB 13.1 03/07/2022  ? HCT 38.7 03/07/2022  ? PLT 246.0 03/07/2022  ? GLUCOSE 91 03/07/2022  ? CHOL 200 03/07/2022  ? TRIG 174.0 (H) 03/07/2022  ? HDL 39.50 03/07/2022  ? LDLDIRECT 151.0 04/03/2020  ? LDLCALC 126 (H) 03/07/2022  ? ALT 27 03/07/2022  ? AST 22 03/07/2022  ? NA 138 03/07/2022  ? K 4.4 03/07/2022  ? CL 101 03/07/2022  ? CREATININE 0.81 03/07/2022  ? BUN 19 03/07/2022  ? CO2 30 03/07/2022  ? TSH 2.52 03/07/2022  ? ? ?Mammogram 3D SCREEN BREAST BILATERAL ? ?Result Date: 05/22/2021 ?CLINICAL DATA:  Screening. EXAM: DIGITAL SCREENING BILATERAL MAMMOGRAM WITH TOMOSYNTHESIS AND CAD TECHNIQUE: Bilateral screening digital craniocaudal and mediolateral oblique mammograms were obtained. Bilateral screening digital breast tomosynthesis was performed. The images were evaluated with computer-aided detection. COMPARISON:  Previous exam(s). ACR Breast Density Category b: There are scattered areas of fibroglandular density. FINDINGS: There are no findings suspicious for malignancy. IMPRESSION: No mammographic evidence of malignancy. A result letter of this screening mammogram will be mailed directly to the patient. RECOMMENDATION: Screening mammogram in one year. (Code:SM-B-01Y) BI-RADS  CATEGORY  1: Negative. Electronically Signed   By: Audie Pinto M.D.   On: 05/22/2021 15:41 ? ? ?   ?Assessment & Plan:  ? ?Problem List Items Addressed This Visit   ? ? Abnormal Pap smear of cervix  ?  Seeing Dr B

## 2022-03-09 ENCOUNTER — Encounter: Payer: Self-pay | Admitting: Internal Medicine

## 2022-03-09 DIAGNOSIS — L299 Pruritus, unspecified: Secondary | ICD-10-CM | POA: Insufficient documentation

## 2022-03-09 NOTE — Assessment & Plan Note (Signed)
S/p left parathyroidectomy.  Follow calcium.  

## 2022-03-09 NOTE — Assessment & Plan Note (Signed)
Seeing Dr Leafy Ro.  Just had f/u pap.  Waiting for results to determine f/u.  ?

## 2022-03-09 NOTE — Assessment & Plan Note (Signed)
Continue cpap.  

## 2022-03-09 NOTE — Assessment & Plan Note (Signed)
The 10-year ASCVD risk score (Arnett DK, et al., 2019) is: 12.9% ?  Values used to calculate the score: ?    Age: 71 years ?    Sex: Female ?    Is Non-Hispanic African American: No ?    Diabetic: No ?    Tobacco smoker: No ?    Systolic Blood Pressure: 370 mmHg ?    Is BP treated: No ?    HDL Cholesterol: 39.5 mg/dL ?    Total Cholesterol: 200 mg/dL  ?Have discussed calculated cholesterol risk.  Has wanted to continue diet and exercise.  Follow lipid panel.  ?

## 2022-03-09 NOTE — Assessment & Plan Note (Signed)
No erythema.  No lesions.  TMs clear.  Discussed small amount of 1% HC cream topically - using sparingly.  Avoid inserting hearing aids when apply.   ?

## 2022-03-09 NOTE — Assessment & Plan Note (Signed)
Continue effexor.  Appears to be doing better.  Follow.  

## 2022-03-16 IMAGING — MG DIGITAL DIAGNOSTIC BILAT W/ TOMO W/ CAD
8 series · 8 of 24 positions shown · non-contrast
Comparison: Prior exams

CLINICAL DATA: Short-term follow-up for a probably benign mass in
the left breast. This was initially detected on a screening
mammogram on 04/06/2019, assessed with diagnostic mammography and
ultrasound on 04/21/2019. This appeared as a complicated cyst on
ultrasound measuring 7 x 4 x 4 mm. Follow-up ultrasound on
10/27/2019 demonstrated a decrease in size of this cyst to 4 x 4 x 2
mm.

EXAM:
DIGITAL DIAGNOSTIC BILATERAL MAMMOGRAM WITH TOMO AND CAD

[L MLO synth-2D]
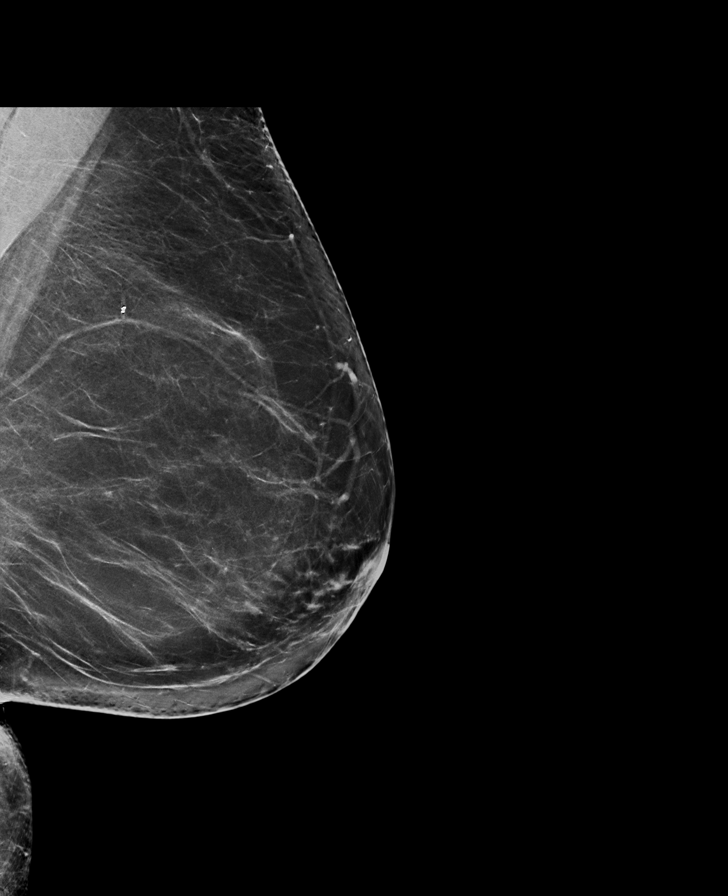

[R MLO synth-2D]
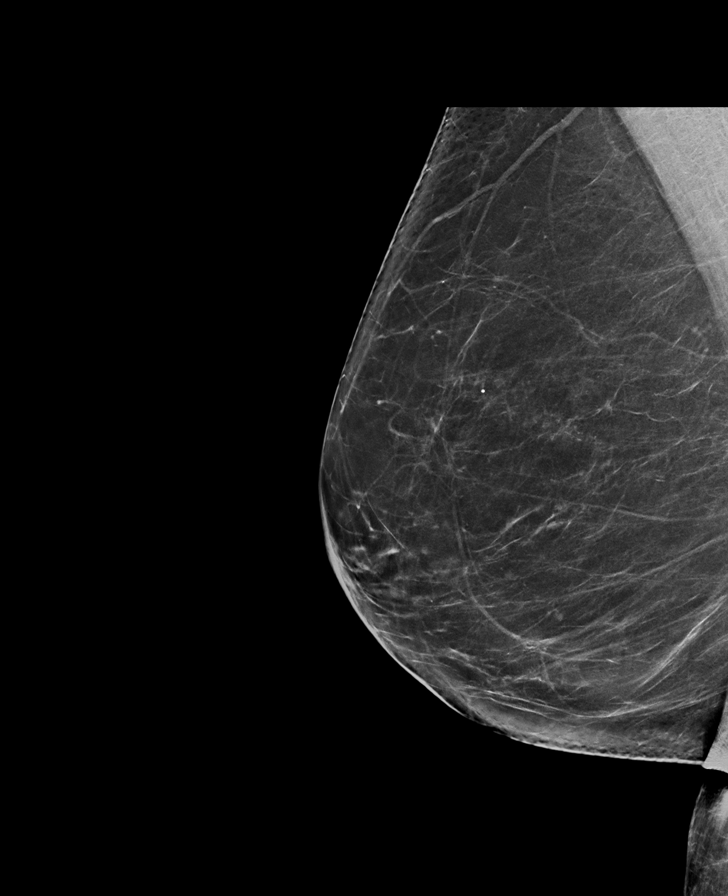

[L CC synth-2D]
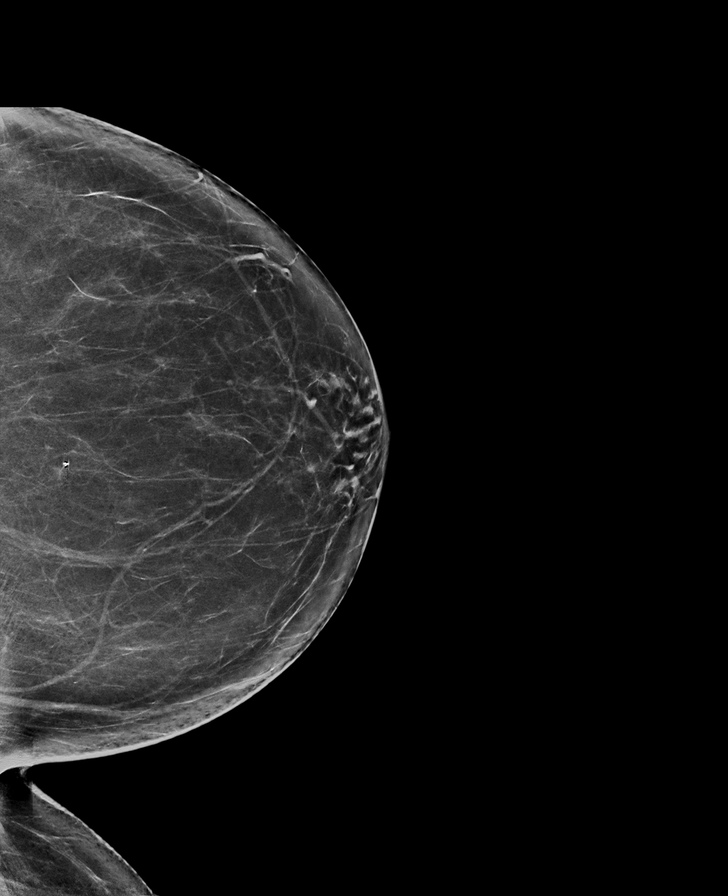

[R CC synth-2D]
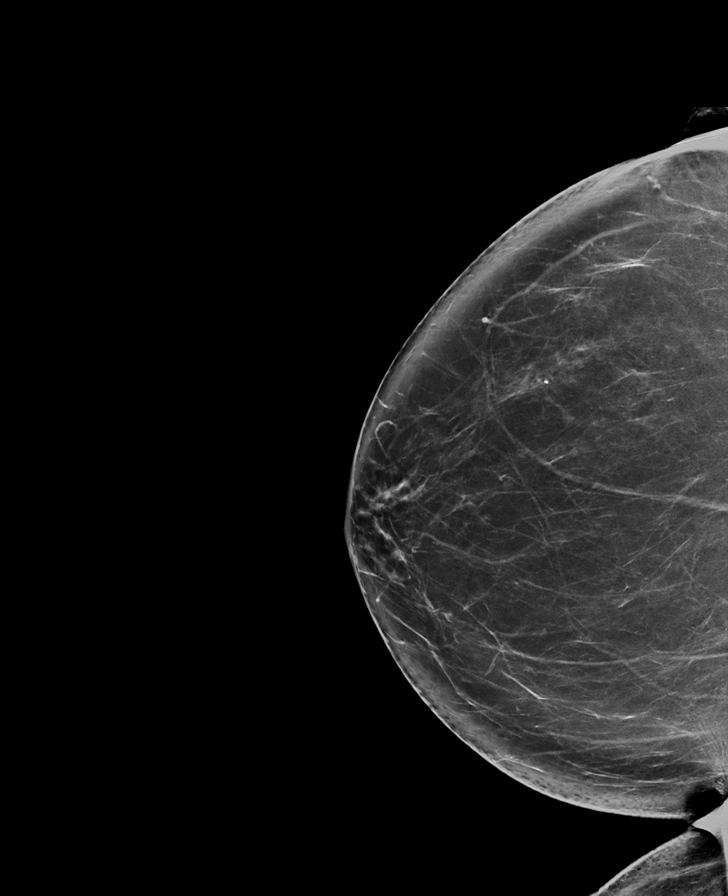

[R MLO tomo · tomo slice 41/80.0]
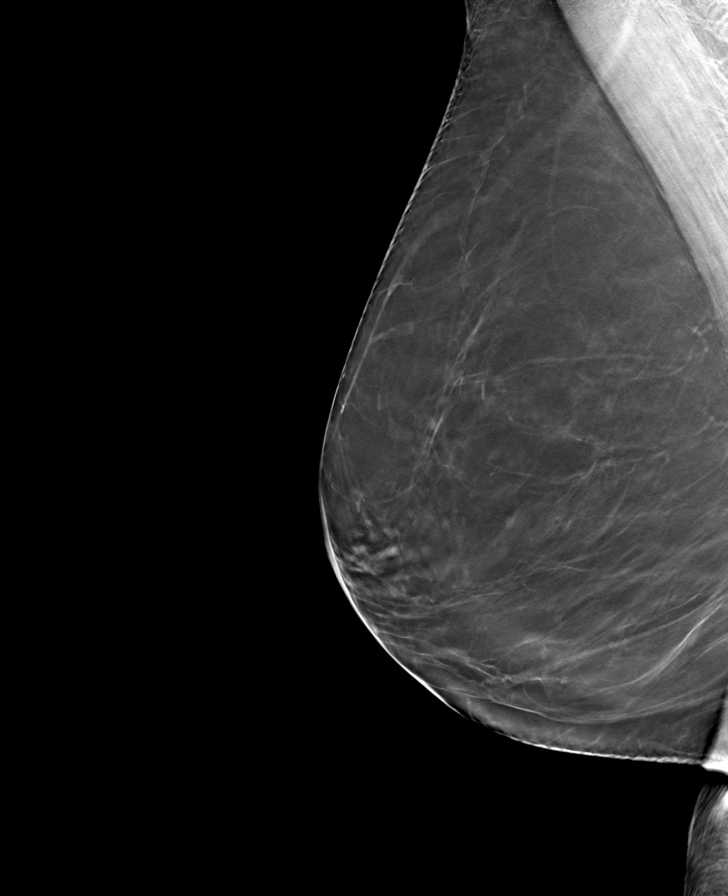

[L MLO tomo · tomo slice 43/85.0]
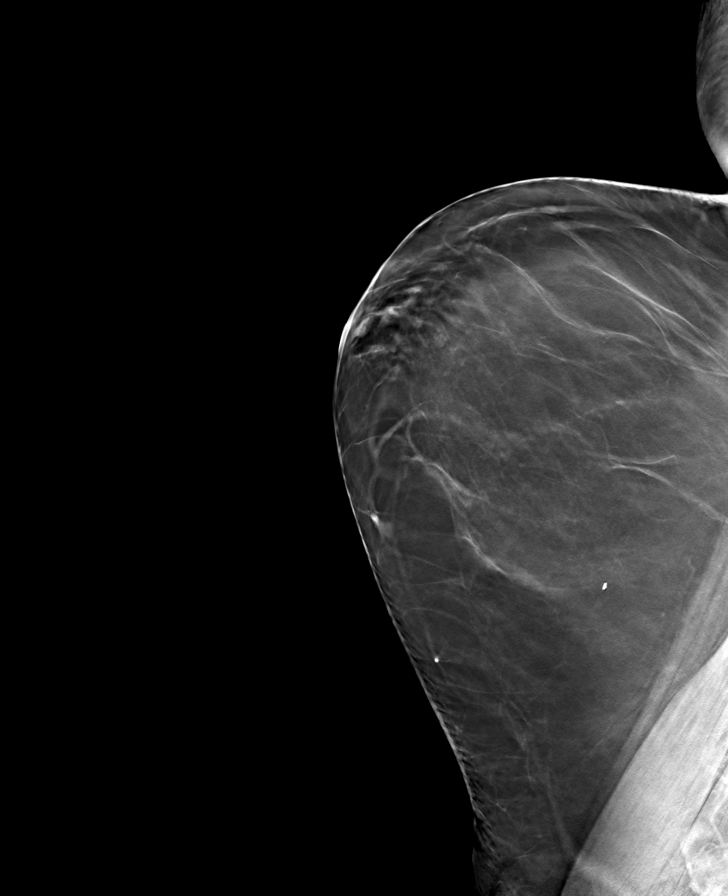

[L CC tomo · tomo slice 37/74.0]
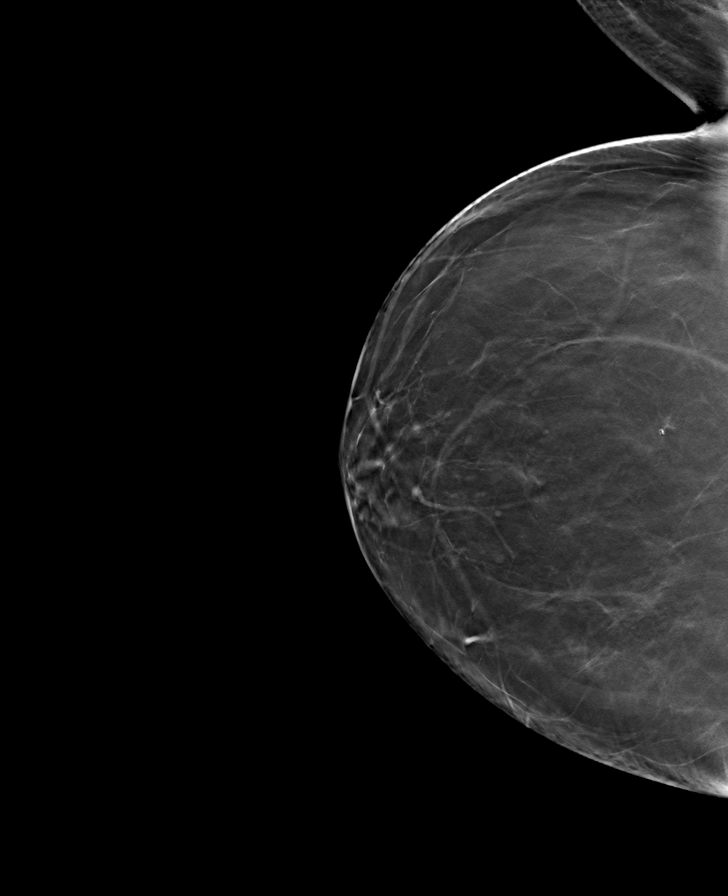

[R CC tomo · tomo slice 42/83.0]
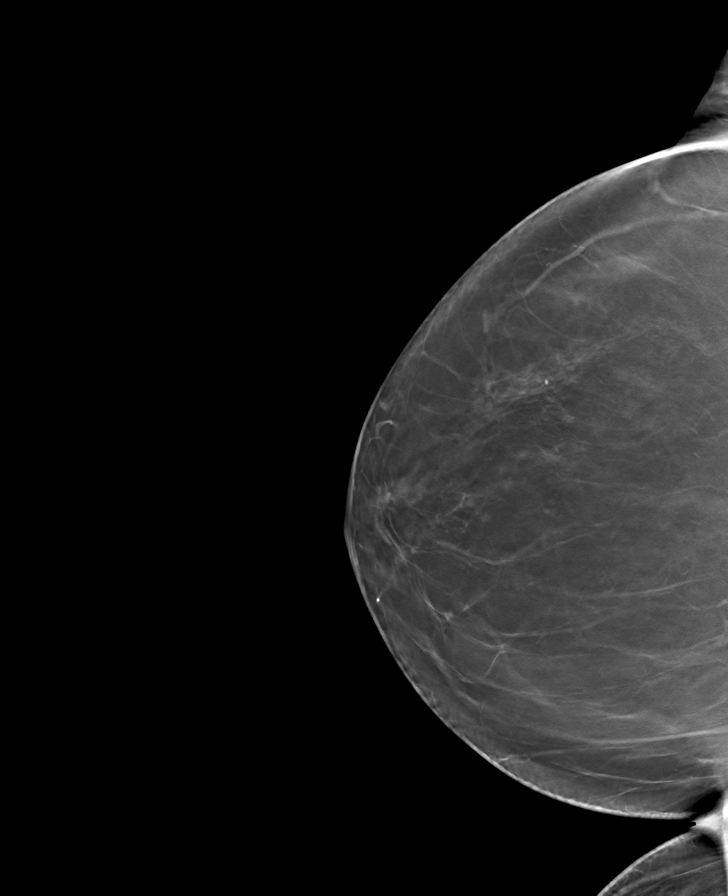

[8 of 24 positions shown; findings below may reference images not displayed]

ACR Breast Density Category b: There are scattered areas of
fibroglandular density.
FINDINGS: The mass seen on the screening study dated 04/06/2019 has resolved.

There are no new masses, no areas of architectural distortion, no
significant asymmetries and no suspicious calcifications.

Mammographic images were processed with CAD.
IMPRESSION: 1. No evidence of breast malignancy. The previously seen left breast
complicated cyst/cluster of cysts has resolved.

RECOMMENDATION:
Screening mammogram in one year.(Code:R5-U-MFP)

I have discussed the findings and recommendations with the patient.
If applicable, a reminder letter will be sent to the patient
regarding the next appointment.

BI-RADS CATEGORY  1: Negative.

## 2022-04-04 ENCOUNTER — Ambulatory Visit: Payer: Medicare Other

## 2022-04-08 ENCOUNTER — Ambulatory Visit (INDEPENDENT_AMBULATORY_CARE_PROVIDER_SITE_OTHER): Payer: Medicare Other

## 2022-04-08 VITALS — Ht 67.0 in | Wt 186.0 lb

## 2022-04-08 DIAGNOSIS — Z0000 Encounter for general adult medical examination without abnormal findings: Secondary | ICD-10-CM | POA: Diagnosis not present

## 2022-04-08 NOTE — Progress Notes (Signed)
Subjective:   Ann Osborne is a 71 y.o. female who presents for Medicare Annual (Subsequent) preventive examination.  Review of Systems    No ROS.  Medicare Wellness Virtual Visit.  Visual/audio telehealth visit, UTA vital signs.   See social history for additional risk factors.   Cardiac Risk Factors include: advanced age (>60mn, >>30women)     Objective:    Today's Vitals   04/08/22 1301  Weight: 186 lb (84.4 kg)  Height: '5\' 7"'$  (1.702 m)   Body mass index is 29.13 kg/m.     04/08/2022    1:08 PM 04/05/2021   10:06 AM 04/03/2020    9:40 AM 04/01/2019   11:38 AM 11/19/2017    6:55 AM 11/09/2017    1:22 PM 10/29/2017   10:13 AM  Advanced Directives  Does Patient Have a Medical Advance Directive? Yes Yes Yes Yes Yes Yes Yes  Type of [place]aramed[place]of [place] will HPort Royal[place] will [place]ens[place]Living will HParagon EstatesLiving will Living will Living will Living will;Healthcare Power of Attorney  Does patient want to make changes to medical advance directive? No - Patient declined No - Patient declined No - Patient declined No - Patient declined  No - Patient declined No - Patient declined  Copy of [place] Chart? No - copy requested Yes - validated most recent copy scanned in chart (See row information) Yes - validated most recent copy scanned in chart (See row information) Yes - validated most recent copy scanned in chart (See row information) No - copy requested No - copy requested No - copy requested    Current Medications (verified) Outpatient Encounter Medications as of 04/08/2022  Medication Sig   Alpha-Lipoic Acid 50 MG TABS Take by mouth daily.   Multiple Vitamin (MULTIVITAMIN) capsule Take 1 capsule by mouth daily.   Multiple Vitamins-Minerals (ICAPS AREDS 2 PO) Take by mouth 2 (two) times daily.   omeprazole (PRILOSEC) 20 MG capsule Take 1 capsule (20 mg total)  by mouth 2 (two) times daily before a meal.   Ruxolitinib Phosphate (OPZELURA) 1.5 % CREA Apply 1 application topically 2 (two) times daily. Apply to itchy areas on arms QD-BID PRN flares.   venlafaxine XR (EFFEXOR-XR) 75 MG 24 hr capsule TAKE ONE CAPSULE BY MOUTH EVERY DAY   No facility-administered encounter medications on file as of 04/08/2022.    Allergies (verified) Compazine [prochlorperazine edisylate] and Sulfa antibiotics   History: Past Medical History:  Diagnosis Date   ADD (attention deficit disorder)    Allergy    Basal cell carcinoma 08/05/2007   left upper back medial mid scapula    Depression    Dysplastic nevus 03/27/2016   left infrascapular, moderate atypia    GERD (gastroesophageal reflux disease)    Hyperparathyroidism ([place]    Lyme disease 6-7 YEARS   history   Past Surgical History:  Procedure Laterality Date   BREAST BIOPSY Left 20 yrs ago   EYE SURGERY Bilateral 2007 OR 2008   LENS REPLACEMENT    THYROIDECTOMY Left 11/19/2017   Procedure: LEFT INFERIOR THYROIDECTOMY;  Surgeon: GArmandina Gemma MD;  Location: [facility];  Service: General;  Laterality: Left;   TUBAL LIGATION  1978   bilateral   WISDOM TOOTH EXTRACTION  1980   Family History  Problem Relation Age of Onset   Uterine cancer Mother    Heart disease Maternal Grandmother    Alcohol abuse Maternal Grandfather  Parkinson's disease Paternal Grandmother    Depression Paternal Grandfather    Breast cancer Neg Hx    Social History   Socioeconomic History   Marital status: Married    Spouse name: Not on file   Number of children: 2   Years of education: Not on file   Highest education level: Not on file  Occupational History   Not on file  Tobacco Use   Smoking status: Never   Smokeless tobacco: Never  Vaping Use   Vaping Use: Never used  Substance and Sexual Activity   Alcohol use: Yes    Alcohol/week: 0.0 standard drinks    Comment: occasional   Drug use: No   Sexual activity:  Not Currently  Other Topics Concern   Not on file  Social History Narrative   Not on file   Social Determinants of Health   Financial Resource Strain: Low Risk    Difficulty of Paying Living Expenses: Not hard at all  Food Insecurity: No Food Insecurity   Worried About Charity fundraiser in the Last Year: Never true   Freedom in the Last Year: Never true  Transportation Needs: No Transportation Needs   Lack of Transportation (Medical): No   Lack of Transportation (Non-Medical): No  Physical Activity: Sufficiently Active   Days of Exercise per Week: 4 days   Minutes of Exercise per Session: 60 min  Stress: No Stress Concern Present   Feeling of Stress : Not at all  Social Connections: Unknown   Frequency of Communication with Friends and Family: More than three times a week   Frequency of Social Gatherings with Friends and Family: Not on file   Attends Religious Services: More than 4 times per year   Active Member of [employer] or Organizations: Not on file   Attends Archivist Meetings: Not on file   Marital Status: Married    Tobacco Counseling Counseling given: Not Answered   Clinical Intake:  Pre-visit preparation completed: Yes        Diabetes: No  How often do you need to have someone help you when you read instructions, pamphlets, or other written materials from your doctor or pharmacy?: 1 - Never  Interpreter Needed?: No    Activities of Daily Living    04/08/2022    1:09 PM  In your present state of health, do you have any difficulty performing the following activities:  Hearing? 0  Vision? 0  Difficulty concentrating or making decisions? 0  Walking or climbing stairs? 0  Dressing or bathing? 0  Doing errands, shopping? 0  Preparing Food and eating ? N  Using the Toilet? N  In the past six months, have you accidently leaked urine? N  Do you have problems with loss of bowel control? N  Managing your Medications? N  Managing your  Finances? N  Housekeeping or managing your Housekeeping? N   Patient Care Team: [name], MD as PCP - General (Internal Medicine)  Indicate any recent Medical Services you may have received from other than [facility] providers in the past year (date may be approximate).     Assessment:   This is a routine wellness examination for Ann Osborne.  Virtual Visit via Telephone Note  I connected with  Ann Osborne on 04/08/22 at  1:00 PM EDT by telephone and verified that I am speaking with the correct person using two identifiers.  Persons participating in the virtual visit: [place]  I discussed the limitations of performing an evaluation and management service by telehealth. We continued and completed visit with audio only.Some vital signs may be absent or patient reported.   Hearing/Vision screen Hearing Screening - Comments:: Hearing aid, bilateral Vision Screening - Comments:: Followed by [facility] ([place]) Bilateral cataracts extracted Wears glasses for distance   Dietary issues and exercise activities discussed: Current Exercise Habits: Home exercise routine, Intensity: Mild   Goals Addressed               This Visit's Progress     Patient Stated     I would like to lose about 10lbs (pt-stated)        Weight desired 165-170lb       Depression Screen    04/08/2022    1:07 PM 03/07/2022    9:34 AM 07/16/2021   10:35 AM 04/05/2021   10:07 AM 02/21/2021    4:37 PM 04/03/2020   11:41 AM 04/03/2020    9:41 AM  PHQ 2/9 Scores  PHQ - 2 Score 0 2 1 0 3 0 0  PHQ- 9 Score 0 3 3 0 7 0     Fall Risk    04/08/2022    1:09 PM 03/07/2022    9:34 AM 04/05/2021   10:06 AM 04/03/2020   11:01 AM 04/03/2020    9:54 AM  [place] in the past year? 0 0 0 1 0  Number falls in past yr: 0  0 0   Injury with Fall?   0 1   Comment    broken right foot   Risk for fall due to :  No Fall Risks     Follow up Falls evaluation completed Falls evaluation  completed Falls evaluation completed Falls evaluation completed Falls evaluation completed   [place]: Home free of loose throw rugs in walkways, pet beds, electrical cords, etc? Yes  Adequate lighting in your home to reduce risk of falls? Yes   ASSISTIVE DEVICES UTILIZED TO PREVENT FALLS: Life alert? No  Use of a cane, walker or w/c? No   TIMED UP AND GO: Was the test performed? No .   Cognitive Function: Patient is alert and oriented x3.     10/29/2017   10:35 AM  MMSE - Mini Mental State Exam  Orientation to time 5  Orientation to Place 5  Registration 3  Attention/ Calculation 5  Recall 3  Language- name 2 objects 2  Language- repeat 1  Language- follow 3 step command 3  Language- read & follow direction 1  Write a sentence 1  Copy design 1  Total score 30        04/01/2019   12:02 PM 10/28/2016   10:40 AM  6CIT Screen  What Year? 0 points 0 points  What month? 0 points 0 points  What time? 0 points 0 points  Count back from 20 0 points 0 points  Months in reverse 0 points 0 points  Repeat phrase 0 points 0 points  Total Score 0 points 0 points    Immunizations Immunization History  Administered Date(s) Administered   Fluad Quad(high Dose 65+) 08/06/2019, 08/07/2020, 07/16/2021   Influenza, High Dose Seasonal PF 09/02/2018   Influenza, Quadrivalent, Recombinant, Inj, Pf 08/19/2017   Influenza,inj,Quad PF,6+ Mos 08/25/2013, 08/02/2014   Influenza-Unspecified 08/20/2015, 08/19/2016   Moderna SARS-COV2 Booster Vaccination 12/31/2020   Moderna Sars-Covid-2 Vaccination 02/03/2020, 03/07/2020, 12/31/2020   Pneumococcal Conjugate-13 08/19/2013  Pneumococcal Polysaccharide-23 08/19/2012, 10/29/2017   Tdap 07/18/2021   Zoster Recombinat (Shingrix) 08/11/2014   Screening Tests Health Maintenance  Topic Date Due   COVID-19 Vaccine (4 - Booster for Moderna series) 04/24/2022 (Originally 02/25/2021)   Zoster Vaccines- Shingrix  (2 of 2) 06/06/2022 (Originally 10/06/2014)   MAMMOGRAM  05/20/2022   INFLUENZA VACCINE  06/10/2022   COLONOSCOPY (Pts 45-72yr Insurance coverage will need to be confirmed)  01/14/2025   TETANUS/TDAP  07/19/2031   Pneumonia Vaccine 71 Years old  Completed   DEXA SCAN  Completed   Hepatitis C Screening  Completed   HPV VACCINES  Aged Out   Health Maintenance There are no preventive care reminders to display for this patient.  Lung Cancer Screening: (Low Dose CT Chest recommended if Age 73-80years, 30 pack-year currently smoking OR have quit w/in 15years.) does not qualify.   Vision Screening: Recommended annual ophthalmology exams for early detection of glaucoma and other disorders of the eye.  Dental Screening: Recommended annual dental exams for proper oral hygiene  Community Resource Referral / Chronic Care Management: CRR required this visit?  No   CCM required this visit?  No      Plan:   Keep all routine maintenance appointments.   I have personally reviewed and noted the following in the patient's chart:   Medical and social history Use of alcohol, tobacco or illicit drugs  Current medications and supplements including opioid prescriptions.  Functional ability and status Nutritional status Physical activity Advanced directives List of other physicians Hospitalizations, surgeries, and ER visits in previous 12 months Vitals Screenings to include cognitive, depression, and falls Referrals and appointments  In addition, I have reviewed and discussed with patient certain preventive protocols, quality metrics, and best practice recommendations. A written personalized care plan for preventive services as well as general preventive health recommendations were provided to patient.     OVarney Biles LPN   [id number]

## 2022-04-08 NOTE — Patient Instructions (Addendum)
  Ms. Averitt , Thank you for taking time to come for your Medicare Wellness Visit. I appreciate your ongoing commitment to your health goals. Please review the following plan we discussed and let me know if I can assist you in the future.   These are the goals we discussed:  Goals       Patient Stated     I would like to lose about 10lbs (pt-stated)      Weight desired 165-170lb        This is a list of the screening recommended for you and due dates:  Health Maintenance  Topic Date Due   COVID-19 Vaccine (4 - Booster for Moderna series) 04/24/2022*   Zoster (Shingles) Vaccine (2 of 2) 06/06/2022*   Mammogram  05/20/2022   Flu Shot  06/10/2022   Colon Cancer Screening  01/14/2025   Tetanus Vaccine  07/19/2031   Pneumonia Vaccine  Completed   DEXA scan (bone density measurement)  Completed   Hepatitis C Screening: USPSTF Recommendation to screen - Ages 43-79 yo.  Completed   HPV Vaccine  Aged Out  *Topic was postponed. The date shown is not the original due date.

## 2022-04-09 ENCOUNTER — Telehealth: Payer: Self-pay

## 2022-04-09 ENCOUNTER — Other Ambulatory Visit: Payer: Self-pay

## 2022-04-09 MED ORDER — OMEPRAZOLE 20 MG PO CPDR: 20.0000 mg | DELAYED_RELEASE_CAPSULE | Freq: Two times a day (BID) | ORAL | 1 refills | Status: DC

## 2022-04-09 NOTE — Telephone Encounter (Signed)
sent 

## 2022-04-09 NOTE — Telephone Encounter (Signed)
Patient states she would like to request a refill for her omeprazole (PRILOSEC) 20 MG capsule.  *Patient states her preferred pharmacy is [facility].

## 2022-04-21 ENCOUNTER — Ambulatory Visit: Payer: Medicare Other | Admitting: Dermatology

## 2022-04-23 ENCOUNTER — Other Ambulatory Visit: Payer: Self-pay | Admitting: Internal Medicine

## 2022-04-23 DIAGNOSIS — Z1231 Encounter for screening mammogram for malignant neoplasm of breast: Secondary | ICD-10-CM

## 2022-06-17 ENCOUNTER — Telehealth: Payer: Self-pay | Admitting: Primary Care

## 2022-06-17 NOTE — Telephone Encounter (Signed)
Order was placed to [facility] on 02/11/2022 for cpap. Spoke to [place] with [facility]. He stated that according to their records, patient's address is [place] and order was forwarded to [facility] location.  Spoke to patient and confirmed that she currently lives in [place].  Called Brad back and relayed this information. He stated that he will send a email to the processing team for an update.   Leroy Sea will call with update.

## 2022-06-19 NOTE — Telephone Encounter (Signed)
Spoke to patient. She stated that [facility] has not contacted her.  Spoke to [place] with [facility] for update. He stated that he would send a follow up message to Bradley at the [place] location for an update.

## 2022-06-24 NOTE — Telephone Encounter (Signed)
Spoke to patient. She stated that missed [facility]'s call, however she plans to call back today. She will call back if anything further is needed.  Will close encounter.

## 2022-07-08 ENCOUNTER — Encounter: Payer: Medicare Other | Admitting: Internal Medicine

## 2022-07-08 ENCOUNTER — Ambulatory Visit
Admission: RE | Admit: 2022-07-08 | Discharge: 2022-07-08 | Disposition: A | Discharge status: Home / Self Care | Payer: Medicare Other | Source: Ambulatory Visit | Attending: Internal Medicine | Admitting: Internal Medicine

## 2022-07-08 DIAGNOSIS — Z1231 Encounter for screening mammogram for malignant neoplasm of breast: Secondary | ICD-10-CM | POA: Diagnosis present

## 2022-07-10 ENCOUNTER — Other Ambulatory Visit: Payer: Self-pay | Admitting: Internal Medicine

## 2022-07-10 DIAGNOSIS — N63 Unspecified lump in unspecified breast: Secondary | ICD-10-CM

## 2022-07-10 DIAGNOSIS — R928 Other abnormal and inconclusive findings on diagnostic imaging of breast: Secondary | ICD-10-CM

## 2022-07-30 ENCOUNTER — Ambulatory Visit
Admission: RE | Admit: 2022-07-30 | Discharge: 2022-07-30 | Disposition: A | Discharge status: Home / Self Care | Payer: Medicare Other | Source: Ambulatory Visit | Attending: Internal Medicine | Admitting: Internal Medicine

## 2022-07-30 DIAGNOSIS — R928 Other abnormal and inconclusive findings on diagnostic imaging of breast: Secondary | ICD-10-CM | POA: Diagnosis present

## 2022-07-30 DIAGNOSIS — N63 Unspecified lump in unspecified breast: Secondary | ICD-10-CM | POA: Insufficient documentation

## 2022-07-31 ENCOUNTER — Telehealth: Payer: Self-pay | Admitting: *Deleted

## 2022-07-31 NOTE — Telephone Encounter (Signed)
Called patient concerning mammogram please forward call to me.

## 2022-08-05 ENCOUNTER — Other Ambulatory Visit: Payer: Self-pay | Admitting: Internal Medicine

## 2022-08-05 DIAGNOSIS — R928 Other abnormal and inconclusive findings on diagnostic imaging of breast: Secondary | ICD-10-CM

## 2022-08-05 NOTE — Progress Notes (Signed)
Order placed for referral to surgery.  

## 2022-08-06 ENCOUNTER — Telehealth: Payer: Self-pay | Admitting: Internal Medicine

## 2022-08-06 ENCOUNTER — Other Ambulatory Visit: Payer: Self-pay

## 2022-08-06 MED ORDER — VENLAFAXINE HCL ER 75 MG PO CP24: 75.0000 mg | ORAL_CAPSULE | Freq: Every day | ORAL | 1 refills | Status: DC

## 2022-08-06 NOTE — Telephone Encounter (Signed)
Send medication refill to [facility] on [address]. Not to mail order this time.venlafaxine XR (EFFEXOR-XR) 75 MG 24 hr capsule

## 2022-08-06 NOTE — Telephone Encounter (Signed)
Pt notified that refill was sent.

## 2022-09-01 ENCOUNTER — Ambulatory Visit (INDEPENDENT_AMBULATORY_CARE_PROVIDER_SITE_OTHER): Payer: Medicare Other | Admitting: Internal Medicine

## 2022-09-01 ENCOUNTER — Encounter: Payer: Self-pay | Admitting: Internal Medicine

## 2022-09-01 VITALS — BP 110/60 | HR 63 | Temp 97.7°F | Resp 14 | Ht 67.0 in | Wt 183.8 lb

## 2022-09-01 DIAGNOSIS — E559 Vitamin D deficiency, unspecified: Secondary | ICD-10-CM

## 2022-09-01 DIAGNOSIS — E538 Deficiency of other specified B group vitamins: Secondary | ICD-10-CM

## 2022-09-01 DIAGNOSIS — Z0000 Encounter for general adult medical examination without abnormal findings: Secondary | ICD-10-CM | POA: Diagnosis not present

## 2022-09-01 DIAGNOSIS — Z23 Encounter for immunization: Secondary | ICD-10-CM

## 2022-09-01 DIAGNOSIS — G4730 Sleep apnea, unspecified: Secondary | ICD-10-CM

## 2022-09-01 DIAGNOSIS — E7800 Pure hypercholesterolemia, unspecified: Secondary | ICD-10-CM

## 2022-09-01 DIAGNOSIS — Z8639 Personal history of other endocrine, nutritional and metabolic disease: Secondary | ICD-10-CM

## 2022-09-01 DIAGNOSIS — R87619 Unspecified abnormal cytological findings in specimens from cervix uteri: Secondary | ICD-10-CM

## 2022-09-01 DIAGNOSIS — F320 Major depressive disorder, single episode, mild: Secondary | ICD-10-CM

## 2022-09-01 LAB — LIPID PANEL
Cholesterol: 210 mg/dL — ABNORMAL HIGH (ref 0–200)
HDL: 32.3 mg/dL — ABNORMAL LOW (ref >39.00)
LDL Cholesterol: 142 mg/dL — ABNORMAL HIGH (ref 0–99)
NonHDL: 178.1
Total CHOL/HDL Ratio: 7
Triglycerides: 182 mg/dL — ABNORMAL HIGH (ref 0.0–149.0)
VLDL: 36.4 mg/dL (ref 0.0–40.0)

## 2022-09-01 LAB — HEPATIC FUNCTION PANEL
ALT: 17 U/L (ref 0–35)
AST: 19 U/L (ref 0–37)
Albumin: 4.3 g/dL (ref 3.5–5.2)
Alkaline Phosphatase: 89 U/L (ref 39–117)
Bilirubin, Direct: 0.1 mg/dL (ref 0.0–0.3)
Total Bilirubin: 0.6 mg/dL (ref 0.2–1.2)
Total Protein: 7.3 g/dL (ref 6.0–8.3)

## 2022-09-01 LAB — TSH: TSH: 2.37 u[IU]/mL (ref 0.35–5.50)

## 2022-09-01 LAB — VITAMIN D 25 HYDROXY (VIT D DEFICIENCY, FRACTURES): VITD: 51.35 ng/mL (ref 30.00–100.00)

## 2022-09-01 LAB — BASIC METABOLIC PANEL
BUN: 14 mg/dL (ref 6–23)
CO2: 30 mEq/L (ref 19–32)
Calcium: 9.2 mg/dL (ref 8.4–10.5)
Chloride: 100 mEq/L (ref 96–112)
Creatinine, Ser: 0.79 mg/dL (ref 0.40–1.20)
GFR: 74.08 mL/min (ref >60.00)
Glucose, Bld: 94 mg/dL (ref 70–99)
Potassium: 4.2 mEq/L (ref 3.5–5.1)
Sodium: 137 mEq/L (ref 135–145)

## 2022-09-01 LAB — VITAMIN B12: Vitamin B-12: 727 pg/mL (ref 211–911)

## 2022-09-01 NOTE — Assessment & Plan Note (Addendum)
Physical today 09/01/22.  Mammogram 07/10/22 - birads 0.  F/u mammogram 07/2022 - Birads III.  Recommended f/u in 6 months.  Colonoscopy 01/2015.  Recommended f/u in 10 years.

## 2022-09-01 NOTE — Progress Notes (Signed)
[name], female   DOB: [birth year], 71 y.o.   MRN: [id number]   Subjective:    [name], female    DOB: Jan 24, 1951, 71 y.o.   MRN: [id number]   Patient here for  Chief Complaint  Patient presents with   Annual Exam   .   HPI Here for her physical exam.  Saw pulmonary. OSA - CPAP.  Dr Leafy Ro - f/u abnormal pap. Recommended annual f/u per note.  Increased stress.  Husband's health issues.  Increased stress with - houses.  Overall appears to be handling things well.  Arthritis is better.  Fatigue is better.  Eating better.  No chest pain or sob reported.  No abdominal pain.  Bowels moving.     Past Medical History:  Diagnosis Date   ADD (attention deficit disorder)    Allergy    Basal cell carcinoma 08/05/2007   left upper back medial mid scapula    Depression    Dysplastic nevus 03/27/2016   left infrascapular, moderate atypia    GERD (gastroesophageal reflux disease)    Hyperparathyroidism ([place])    Lyme disease 6-7 YEARS   history   Past Surgical History:  Procedure Laterality Date   BREAST BIOPSY Left 20 yrs ago   EYE SURGERY Bilateral 2007 OR 2008   LENS REPLACEMENT    THYROIDECTOMY Left 11/19/2017   Procedure: LEFT INFERIOR THYROIDECTOMY;  Surgeon: Armandina Gemma, MD;  Location: [facility];  Service: General;  Laterality: Left;   TUBAL LIGATION  1978   bilateral   WISDOM TOOTH EXTRACTION  1980   Family History  Problem Relation Age of Onset   Uterine cancer Mother    Heart disease Maternal Grandmother    Alcohol abuse Maternal Grandfather    Parkinson's disease Paternal Grandmother    Depression Paternal Grandfather    Breast cancer Neg Hx    Social History   Socioeconomic History   Marital status: Married    Spouse name: Not on file   Number of children: 2   Years of education: Not on file   Highest education level: Not on file  Occupational History   Not on file  Tobacco Use   Smoking status: Never   Smokeless tobacco:  Never  Vaping Use   Vaping Use: Never used  Substance and Sexual Activity   Alcohol use: Yes    Alcohol/week: 0.0 standard drinks of alcohol    Comment: occasional   Drug use: No   Sexual activity: Not Currently  Other Topics Concern   Not on file  Social History Narrative   Not on file   Social Determinants of Health   Financial Resource Strain: Low Risk  (04/08/2022)   Overall Financial Resource Strain (CARDIA)    Difficulty of Paying Living Expenses: Not hard at all  Food Insecurity: No Food Insecurity (04/08/2022)   Hunger Vital Sign    Worried About Running Out of Food in the Last Year: Never true    Ran Out of Food in the Last Year: Never true  Transportation Needs: No Transportation Needs (04/08/2022)   PRAPARE - Hydrologist (Medical): No    Lack of Transportation (Non-Medical): No  Physical Activity: Sufficiently Active (04/08/2022)   Exercise Vital Sign    Days of Exercise per Week: 4 days    Minutes of Exercise per Session: 60 min  Stress: No Stress Concern Present (04/08/2022)   [employer] -  Occupational Stress Questionnaire    Feeling of Stress : Not at all  Social Connections: Unknown (04/08/2022)   Social Connection and Isolation Panel [NHANES]    Frequency of Communication with Friends and Family: More than three times a week    Frequency of Social Gatherings with Friends and Family: Not on file    Attends Religious Services: More than 4 times per year    Active Member of [employer] or Organizations: Not on file    Attends Archivist Meetings: Not on file    Marital Status: Married     Review of Systems  Constitutional:  Negative for appetite change and unexpected weight change.  HENT:  Negative for congestion, sinus pressure and sore throat.   Eyes:  Negative for pain and visual disturbance.  Respiratory:  Negative for cough, chest tightness and shortness of breath.   Cardiovascular:  Negative  for chest pain, palpitations and leg swelling.  Gastrointestinal:  Negative for abdominal pain, diarrhea, nausea and vomiting.  Genitourinary:  Negative for difficulty urinating and dysuria.  Musculoskeletal:  Negative for joint swelling and myalgias.  Skin:  Negative for color change and rash.  Neurological:  Negative for dizziness, light-headedness and headaches.  Hematological:  Negative for adenopathy. Does not bruise/bleed easily.  Psychiatric/Behavioral:  Negative for agitation and dysphoric mood.        Objective:     BP 110/60 (BP Location: Left Arm, Patient Position: Sitting, Cuff Size: Normal)   Pulse 63   Temp 97.7 F (36.5 C) (Oral)   Resp 14   Ht '5\' 7"'$  (1.702 m)   Wt 183 lb 12.8 oz (83.4 kg)   LMP 07/16/1995 (LMP Unknown)   SpO2 95%   BMI 28.79 kg/m  Wt Readings from Last 3 Encounters:  09/01/22 183 lb 12.8 oz (83.4 kg)  04/08/22 186 lb (84.4 kg)  03/07/22 186 lb 9.6 oz (84.6 kg)    Physical Exam Vitals reviewed.  Constitutional:      General: She is not in acute distress.    Appearance: Normal appearance. She is well-developed.  HENT:     Head: Normocephalic and atraumatic.     Right Ear: External ear normal.     Left Ear: External ear normal.  Eyes:     General: No scleral icterus.       Right eye: No discharge.        Left eye: No discharge.     Conjunctiva/sclera: Conjunctivae normal.  Neck:     Thyroid: No thyromegaly.  Cardiovascular:     Rate and Rhythm: Normal rate and regular rhythm.  Pulmonary:     Effort: No tachypnea, accessory muscle usage or respiratory distress.     Breath sounds: Normal breath sounds. No decreased breath sounds or wheezing.  Chest:  Breasts:    Right: No inverted nipple, mass, nipple discharge or tenderness (no axillary adenopathy).     Left: No inverted nipple, mass, nipple discharge or tenderness (no axilarry adenopathy).  Abdominal:     General: Bowel sounds are normal.     Palpations: Abdomen is soft.      Tenderness: There is no abdominal tenderness.  Musculoskeletal:        General: No swelling or tenderness.     Cervical back: Neck supple.  Lymphadenopathy:     Cervical: No cervical adenopathy.  Skin:    Findings: No erythema or rash.  Neurological:     Mental Status: She is alert and oriented to person, place, and  time.  Psychiatric:        Mood and Affect: Mood normal.        Behavior: Behavior normal.      Outpatient Encounter Medications as of 09/01/2022  Medication Sig   Alpha-Lipoic Acid 50 MG TABS Take by mouth daily.   Multiple Vitamin (MULTIVITAMIN) capsule Take 1 capsule by mouth daily.   Multiple Vitamins-Minerals (ICAPS AREDS 2 PO) Take by mouth 2 (two) times daily.   omeprazole (PRILOSEC) 20 MG capsule Take 1 capsule (20 mg total) by mouth 2 (two) times daily before a meal.   Ruxolitinib Phosphate (OPZELURA) 1.5 % CREA Apply 1 application topically 2 (two) times daily. Apply to itchy areas on arms QD-BID PRN flares.   venlafaxine XR (EFFEXOR-XR) 75 MG 24 hr capsule Take 1 capsule (75 mg total) by mouth daily.   No facility-administered encounter medications on file as of 09/01/2022.     Lab Results  Component Value Date   WBC 5.3 03/07/2022   HGB 13.1 03/07/2022   HCT 38.7 03/07/2022   PLT 246.0 03/07/2022   GLUCOSE 94 09/01/2022   CHOL 210 (H) 09/01/2022   TRIG 182.0 (H) 09/01/2022   HDL 32.30 (L) 09/01/2022   LDLDIRECT 151.0 04/03/2020   LDLCALC 142 (H) 09/01/2022   ALT 17 09/01/2022   AST 19 09/01/2022   NA 137 09/01/2022   K 4.2 09/01/2022   CL 100 09/01/2022   CREATININE 0.79 09/01/2022   BUN 14 09/01/2022   CO2 30 09/01/2022   TSH 2.37 09/01/2022    MM DIAG BREAST TOMO UNI RIGHT  Result Date: 07/30/2022 CLINICAL DATA:  Callback for RIGHT breast mass.  History of cysts EXAM: DIGITAL DIAGNOSTIC UNILATERAL RIGHT MAMMOGRAM WITH TOMOSYNTHESIS; ULTRASOUND RIGHT BREAST LIMITED TECHNIQUE: Right digital diagnostic mammography and breast tomosynthesis  was performed.; Targeted ultrasound examination of the right breast was performed COMPARISON:  Previous exam(s). ACR Breast Density Category b: There are scattered areas of fibroglandular density. FINDINGS: Additional diagnostic images demonstrate a persistent oval circumscribed mass in the RIGHT upper inner breast at anterior depth. On physical exam, no suspicious mass is appreciated. Targeted ultrasound was performed of the RIGHT upper inner breast. At 2 o'clock 3 cm from the nipple, there is an oval circumscribed near anechoic mass with suggestion of multiple thin internal septations. It measures approximately 3 x 2 x 3 mm. This corresponds to the site of screening mammographic concern and likely reflects a minimally complicated cyst. IMPRESSION: There is a probably benign mass at the site of screening mammographic concern which is favored to reflect a complicated cyst. Recommend follow-up mammogram and ultrasound in 6 months. This will establish 6 months of definitive stability/appropriate evolution. RECOMMENDATION: RIGHT diagnostic mammogram and ultrasound in 6 months I have discussed the findings and recommendations with the patient. If applicable, a reminder letter will be sent to the patient regarding the next appointment. BI-RADS CATEGORY  3: Probably benign. Electronically Signed   By: Valentino Saxon M.D.   On: 07/30/2022 15:49  US BREAST LTD UNI RIGHT INC AXILLA  Result Date: 07/30/2022 CLINICAL DATA:  Callback for RIGHT breast mass.  History of cysts EXAM: DIGITAL DIAGNOSTIC UNILATERAL RIGHT MAMMOGRAM WITH TOMOSYNTHESIS; ULTRASOUND RIGHT BREAST LIMITED TECHNIQUE: Right digital diagnostic mammography and breast tomosynthesis was performed.; Targeted ultrasound examination of the right breast was performed COMPARISON:  Previous exam(s). ACR Breast Density Category b: There are scattered areas of fibroglandular density. FINDINGS: Additional diagnostic images demonstrate a persistent oval  circumscribed mass in the RIGHT upper  inner breast at anterior depth. On physical exam, no suspicious mass is appreciated. Targeted ultrasound was performed of the RIGHT upper inner breast. At 2 o'clock 3 cm from the nipple, there is an oval circumscribed near anechoic mass with suggestion of multiple thin internal septations. It measures approximately 3 x 2 x 3 mm. This corresponds to the site of screening mammographic concern and likely reflects a minimally complicated cyst. IMPRESSION: There is a probably benign mass at the site of screening mammographic concern which is favored to reflect a complicated cyst. Recommend follow-up mammogram and ultrasound in 6 months. This will establish 6 months of definitive stability/appropriate evolution. RECOMMENDATION: RIGHT diagnostic mammogram and ultrasound in 6 months I have discussed the findings and recommendations with the patient. If applicable, a reminder letter will be sent to the patient regarding the next appointment. BI-RADS CATEGORY  3: Probably benign. Electronically Signed   By: Valentino Saxon M.D.   On: 07/30/2022 15:49      Assessment & Plan:   Problem List Items Addressed This Visit     Abnormal Pap smear of cervix    Referred to gyn. Seeing Dr Leafy Ro.       Depression, major, single episode, mild ([place])    Continue effexor.  Appears to be doing better.  Follow.       Health care maintenance    Physical today 09/01/22.  Mammogram 07/10/22 - birads 0.  F/u mammogram 07/2022 - Birads III.  Recommended f/u in 6 months.  Colonoscopy 01/2015.  Recommended f/u in 10 years.        History of hyperparathyroidism    S/p left parathyroidectomy.  Follow calcium.       Hypercholesterolemia    The 10-year ASCVD risk score (Arnett DK, et al., 2019) is: 10.3%   Values used to calculate the score:     Age: 71 years     Sex: Female     Is Non-Hispanic African American: No     Diabetic: No     Tobacco smoker: No     Systolic Blood Pressure:  110 mmHg     Is BP treated: No     HDL Cholesterol: 32.3 mg/dL     Total Cholesterol: 210 mg/dL  Have discussed calculated cholesterol risk.  Has wanted to continue diet and exercise.  Follow lipid panel.       Relevant Orders   TSH (Completed)   Lipid panel (Completed)   Hepatic function panel (Completed)   Basic metabolic panel (Completed)   Low serum vitamin B12    Check B12 level.        Relevant Orders   Vitamin B12 (Completed)   Sleep apnea    Continue cpap.       Vitamin D deficiency    Check vitamin D level.        Relevant Orders   VITAMIN D 25 Hydroxy (Vit-D Deficiency, Fractures) (Completed)   Other Visit Diagnoses     Routine general medical examination at a health care facility    -  Primary   Need for immunization against influenza       Relevant Orders   Flu Vaccine QUAD High Dose(Fluad) (Completed)        [name], MD

## 2022-09-02 ENCOUNTER — Telehealth: Payer: Self-pay

## 2022-09-02 NOTE — Telephone Encounter (Signed)
LMOM for pt to CB in regards to labs:   [name], MD  Zacarias Pontes, CMA Notify Ms Corle that her cholesterol did increased from last check. Given her calculated cholesterol risk, it is recommended to start a cholesterol medication.  If agreeable, I would like to start crestor '10mg'$  q day.  Will need liver panel checked 6 weeks after starting the medication.  Continue diet and exercise.  Vitamin D level and B12 level - wnl.  Thyroid test, kidney function tests and liver function tests are wnl.

## 2022-09-03 ENCOUNTER — Telehealth: Payer: Self-pay

## 2022-09-03 NOTE — Telephone Encounter (Signed)
Lvm for pt to return call in regards to lab results.  Per Dr.Scott: Notify Ms Plude that her cholesterol did increased from last check. Given her calculated cholesterol risk, it is recommended to start a cholesterol medication.  If agreeable, I would like to start crestor '10mg'$  q day.  Will need liver panel checked 6 weeks after starting the medication.  Continue diet and exercise.  Vitamin D level and B12 level - wnl.  Thyroid test, kidney function tests and liver function tests are wnl.

## 2022-09-07 ENCOUNTER — Encounter: Payer: Self-pay | Admitting: Internal Medicine

## 2022-09-07 NOTE — Assessment & Plan Note (Signed)
S/p left parathyroidectomy.  Follow calcium.

## 2022-09-07 NOTE — Assessment & Plan Note (Signed)
Check vitamin D level 

## 2022-09-07 NOTE — Assessment & Plan Note (Signed)
Referred to gyn. Seeing Dr Leafy Ro.

## 2022-09-07 NOTE — Assessment & Plan Note (Signed)
Continue cpap.  

## 2022-09-07 NOTE — Assessment & Plan Note (Signed)
Continue effexor.  Appears to be doing better.  Follow.

## 2022-09-07 NOTE — Assessment & Plan Note (Signed)
The 10-year ASCVD risk score (Arnett DK, et al., 2019) is: 10.3%   Values used to calculate the score:     Age: 71 years     Sex: Female     Is Non-Hispanic African American: No     Diabetic: No     Tobacco smoker: No     Systolic Blood Pressure: 276 mmHg     Is BP treated: No     HDL Cholesterol: 32.3 mg/dL     Total Cholesterol: 210 mg/dL  Have discussed calculated cholesterol risk.  Has wanted to continue diet and exercise.  Follow lipid panel.

## 2022-09-07 NOTE — Assessment & Plan Note (Signed)
Check B12 level. 

## 2022-10-01 ENCOUNTER — Telehealth: Payer: Self-pay | Admitting: Primary Care

## 2022-10-01 NOTE — Telephone Encounter (Signed)
Printed off office notes from last visit. Faxed to [facility] as requested. Nothing further needed

## 2022-10-13 ENCOUNTER — Telehealth: Payer: Self-pay | Admitting: Internal Medicine

## 2022-10-13 ENCOUNTER — Other Ambulatory Visit: Payer: Self-pay

## 2022-10-13 MED ORDER — OMEPRAZOLE 20 MG PO CPDR: 20.0000 mg | DELAYED_RELEASE_CAPSULE | Freq: Two times a day (BID) | ORAL | 1 refills | Status: DC

## 2022-10-13 MED ORDER — VENLAFAXINE HCL ER 75 MG PO CP24: 75.0000 mg | ORAL_CAPSULE | Freq: Every day | ORAL | 1 refills | Status: DC

## 2022-10-13 NOTE — Telephone Encounter (Signed)
SENT 

## 2022-10-13 NOTE — Telephone Encounter (Signed)
Patient needs the following refills; venlafaxine XR (EFFEXOR-XR) 75 MG 24 hr capsule and omeprazole (PRILOSEC) 20 MG capsule.

## 2022-10-30 ENCOUNTER — Ambulatory Visit (INDEPENDENT_AMBULATORY_CARE_PROVIDER_SITE_OTHER): Payer: Medicare Other | Admitting: Dermatology

## 2022-10-30 ENCOUNTER — Encounter: Payer: Self-pay | Admitting: Dermatology

## 2022-10-30 VITALS — BP 113/66 | HR 69

## 2022-10-30 DIAGNOSIS — D492 Neoplasm of unspecified behavior of bone, soft tissue, and skin: Secondary | ICD-10-CM

## 2022-10-30 DIAGNOSIS — L820 Inflamed seborrheic keratosis: Secondary | ICD-10-CM

## 2022-10-30 DIAGNOSIS — L821 Other seborrheic keratosis: Secondary | ICD-10-CM

## 2022-10-30 DIAGNOSIS — L578 Other skin changes due to chronic exposure to nonionizing radiation: Secondary | ICD-10-CM | POA: Diagnosis not present

## 2022-10-30 NOTE — Patient Instructions (Addendum)
Cryotherapy Aftercare  Wash gently with soap and water everyday.   Apply Vaseline and Band-Aid daily until healed.     Due to recent changes in healthcare laws, you may see results of your pathology and/or laboratory studies on MyChart before the doctors have had a chance to review them. We understand that in some cases there may be results that are confusing or concerning to you. Please understand that not all results are received at the same time and often the doctors may need to interpret multiple results in order to provide you with the best plan of care or course of treatment. Therefore, we ask that you please give us 2 business days to thoroughly review all your results before contacting the office for clarification. Should we see a critical lab result, you will be contacted sooner.   If You Need Anything After Your Visit  If you have any questions or concerns for your doctor, please call our main line at [phone] and press option 4 to reach your doctor's medical assistant. If no one answers, please leave a voicemail as directed and we will return your call as soon as possible. Messages left after 4 pm will be answered the following business day.   You may also send us a message via MyChart. We typically respond to MyChart messages within 1-2 business days.  For prescription refills, please ask your pharmacy to contact our office. Our fax number is [phone].  If you have an urgent issue when the clinic is closed that cannot wait until the next business day, you can page your doctor at the number below.    Please note that while we do our best to be available for urgent issues outside of office hours, we are not available 24/7.   If you have an urgent issue and are unable to reach us, you may choose to seek medical care at your doctor's office, retail clinic, urgent care center, or emergency room.  If you have a medical emergency, please immediately call 911 or go to the  emergency department.  Pager Numbers  - Dr. Kowalski: [phone]  - Dr. Moye: [phone]  - Dr. Stewart: [phone]  In the event of inclement weather, please call our main line at [phone] for an update on the status of any delays or closures.  Dermatology Medication Tips: Please keep the boxes that topical medications come in in order to help keep track of the instructions about where and how to use these. Pharmacies typically print the medication instructions only on the boxes and not directly on the medication tubes.   If your medication is too expensive, please contact our office at [phone] option 4 or send us a message through MyChart.   We are unable to tell what your co-pay for medications will be in advance as this is different depending on your insurance coverage. However, we may be able to find a substitute medication at lower cost or fill out paperwork to get insurance to cover a needed medication.   If a prior authorization is required to get your medication covered by your insurance company, please allow us 1-2 business days to complete this process.  Drug prices often vary depending on where the prescription is filled and some pharmacies may offer cheaper prices.  The website www.goodrx.com contains coupons for medications through different pharmacies. The prices here do not account for what the cost may be with help from insurance (it may be cheaper with your insurance), but the website can   give you the price if you did not use any insurance.  - You can print the associated coupon and take it with your prescription to the pharmacy.  - You may also stop by our office during regular business hours and pick up a GoodRx coupon card.  - If you need your prescription sent electronically to a different pharmacy, notify our office through [facility] MyChart or by phone at [phone] option 4.     Si Usted Necesita Algo Despus de Su Visita  Tambin puede  enviarnos un mensaje a travs de MyChart. Por lo general respondemos a los mensajes de MyChart en el transcurso de 1 a 2 das hbiles.  Para renovar recetas, por favor pida a su farmacia que se ponga en contacto con nuestra oficina. Nuestro nmero de fax es el [phone].  Si tiene un asunto urgente cuando la clnica est cerrada y que no puede esperar hasta el siguiente da hbil, puede llamar/localizar a su doctor(a) al nmero que aparece a continuacin.   Por favor, tenga en cuenta que aunque hacemos todo lo posible para estar disponibles para asuntos urgentes fuera del horario de oficina, no estamos disponibles las 24 horas del da, los 7 das de la semana.   Si tiene un problema urgente y no puede comunicarse con nosotros, puede optar por buscar atencin mdica  en el consultorio de su doctor(a), en una clnica privada, en un centro de atencin urgente o en una sala de emergencias.  Si tiene una emergencia mdica, por favor llame inmediatamente al 911 o vaya a la sala de emergencias.  Nmeros de bper  - Dr. Kowalski: [phone]  - Dra. Moye: [phone]  - Dra. Stewart: [phone]  En caso de inclemencias del tiempo, por favor llame a nuestra lnea principal al [phone] para una actualizacin sobre el estado de cualquier retraso o cierre.  Consejos para la medicacin en dermatologa: Por favor, guarde las cajas en las que vienen los medicamentos de uso tpico para ayudarle a seguir las instrucciones sobre dnde y cmo usarlos. Las farmacias generalmente imprimen las instrucciones del medicamento slo en las cajas y no directamente en los tubos del medicamento.   Si su medicamento es muy caro, por favor, pngase en contacto con nuestra oficina llamando al [phone] y presione la opcin 4 o envenos un mensaje a travs de MyChart.   No podemos decirle cul ser su copago por los medicamentos por adelantado ya que esto es diferente dependiendo de la cobertura de su seguro.  Sin embargo, es posible que podamos encontrar un medicamento sustituto a menor costo o llenar un formulario para que el seguro cubra el medicamento que se considera necesario.   Si se requiere una autorizacin previa para que su compaa de seguros cubra su medicamento, por favor permtanos de 1 a 2 das hbiles para completar este proceso.  Los precios de los medicamentos varan con frecuencia dependiendo del lugar de dnde se surte la receta y alguna farmacias pueden ofrecer precios ms baratos.  El sitio web www.goodrx.com tiene cupones para medicamentos de diferentes farmacias. Los precios aqu no tienen en cuenta lo que podra costar con la ayuda del seguro (puede ser ms barato con su seguro), pero el sitio web puede darle el precio si no utiliz ningn seguro.  - Puede imprimir el cupn correspondiente y llevarlo con su receta a la farmacia.  - Tambin puede pasar por nuestra oficina durante el horario de atencin regular y recoger una tarjeta de cupones de GoodRx.  -   Si necesita que su receta se enve electrnicamente a una farmacia diferente, informe a nuestra oficina a travs de MyChart de [facility] o por telfono llamando al [phone] y presione la opcin 4.  

## 2022-10-30 NOTE — Progress Notes (Signed)
   Follow-Up Visit   Subjective  Ann Osborne is a 71 y.o. female who presents for the following: lesions (Left forearm. Pink, scaly. Concerned could be skin cancer.  Rough brown spot on back). The patient has spots, moles and lesions to be evaluated, some may be new or changing and the patient has concerns that these could be cancer.  The following portions of the chart were reviewed this encounter and updated as appropriate:  Tobacco  Allergies  Meds  Problems  Med Hx  Surg Hx  Fam Hx     Review of Systems: No other skin or systemic complaints except as noted in HPI or Assessment and Plan.  Objective  Well appearing patient in no apparent distress; mood and affect are within normal limits.  A focused examination was performed including left arm, left lower back. Relevant physical exam findings are noted in the Assessment and Plan.  Left Forearm - Posterior 1.5 x 0.9 cm hyperkeratotic pink patch.        Left Lower Back/superior buttock x1 Erythematous keratotic or waxy stuck-on papule or plaque.   Assessment & Plan  Neoplasm of skin -consistent with inflamed seborrheic keratosis If persistent will consider biopsy. Left Forearm - Posterior  Destruction of lesion Complexity: simple   Destruction method: cryotherapy   Informed consent: discussed and consent obtained   Timeout:  patient name, date of birth, surgical site, and procedure verified Lesion destroyed using liquid nitrogen: Yes   Region frozen until ice ball extended beyond lesion: Yes   Outcome: patient tolerated procedure well with no complications   Post-procedure details: wound care instructions given   Additional details:  Prior to procedure, discussed risks of blister formation, small wound, skin dyspigmentation, or rare scar following cryotherapy. Recommend Vaseline ointment to treated areas while healing.   RTC for recheck in 2 months   Inflamed seborrheic keratosis Left Lower Back/superior  buttock x1 Symptomatic, irritating, patient would like treated. Destruction of lesion - Left Lower Back/superior buttock x1 Complexity: simple   Destruction method: cryotherapy   Informed consent: discussed and consent obtained   Timeout:  patient name, date of birth, surgical site, and procedure verified Lesion destroyed using liquid nitrogen: Yes   Region frozen until ice ball extended beyond lesion: Yes   Outcome: patient tolerated procedure well with no complications   Post-procedure details: wound care instructions given   Additional details:  Prior to procedure, discussed risks of blister formation, small wound, skin dyspigmentation, or rare scar following cryotherapy. Recommend Vaseline ointment to treated areas while healing.   Actinic Damage - chronic, secondary to cumulative UV radiation exposure/sun exposure over time - diffuse scaly erythematous macules with underlying dyspigmentation - Recommend daily broad spectrum sunscreen SPF 30+ to sun-exposed areas, reapply every 2 hours as needed.  - Recommend staying in the shade or wearing long sleeves, sun glasses (UVA+UVB protection) and wide brim hats (4-inch brim around the entire circumference of the hat). - Call for new or changing lesions.  Seborrheic Keratoses - Stuck-on, waxy, tan-brown papules and/or plaques  - Benign-appearing - Discussed benign etiology and prognosis. - Observe - Call for any changes  Return in about 2 months (around 12/31/2022) for ISK Follow Up.  I, Emelia Salisbury, CMA, am acting as scribe for Sarina Ser, MD. Documentation: I have reviewed the above documentation for accuracy and completeness, and I agree with the above.  Sarina Ser, MD

## 2022-11-04 ENCOUNTER — Other Ambulatory Visit: Payer: Self-pay

## 2022-11-04 MED ORDER — OMEPRAZOLE 20 MG PO CPDR: 20.0000 mg | DELAYED_RELEASE_CAPSULE | Freq: Two times a day (BID) | ORAL | 1 refills | Status: DC

## 2022-11-04 MED ORDER — VENLAFAXINE HCL ER 75 MG PO CP24: 75.0000 mg | ORAL_CAPSULE | Freq: Every day | ORAL | 1 refills | Status: DC

## 2022-11-04 NOTE — Telephone Encounter (Signed)
Confirmed with pt that she wants rx's to be sent to meds by mail. Re-sent

## 2022-11-04 NOTE — Telephone Encounter (Signed)
Patient called stating her medication had not been received.

## 2022-11-08 ENCOUNTER — Encounter: Payer: Self-pay | Admitting: Dermatology

## 2022-12-29 ENCOUNTER — Other Ambulatory Visit: Payer: Self-pay | Admitting: Internal Medicine

## 2022-12-29 DIAGNOSIS — Z1231 Encounter for screening mammogram for malignant neoplasm of breast: Secondary | ICD-10-CM

## 2022-12-31 ENCOUNTER — Ambulatory Visit (INDEPENDENT_AMBULATORY_CARE_PROVIDER_SITE_OTHER): Payer: Medicare Other | Admitting: Dermatology

## 2022-12-31 VITALS — BP 108/65 | HR 66

## 2022-12-31 DIAGNOSIS — L821 Other seborrheic keratosis: Secondary | ICD-10-CM

## 2022-12-31 DIAGNOSIS — L820 Inflamed seborrheic keratosis: Secondary | ICD-10-CM | POA: Diagnosis not present

## 2022-12-31 NOTE — Patient Instructions (Addendum)
Cryotherapy Aftercare  Wash gently with soap and water everyday.   Apply Vaseline and Band-Aid daily until healed.     Due to recent changes in healthcare laws, you may see results of your pathology and/or laboratory studies on MyChart before the doctors have had a chance to review them. We understand that in some cases there may be results that are confusing or concerning to you. Please understand that not all results are received at the same time and often the doctors may need to interpret multiple results in order to provide you with the best plan of care or course of treatment. Therefore, we ask that you please give us 2 business days to thoroughly review all your results before contacting the office for clarification. Should we see a critical lab result, you will be contacted sooner.   If You Need Anything After Your Visit  If you have any questions or concerns for your doctor, please call our main line at [phone] and press option 4 to reach your doctor's medical assistant. If no one answers, please leave a voicemail as directed and we will return your call as soon as possible. Messages left after 4 pm will be answered the following business day.   You may also send us a message via MyChart. We typically respond to MyChart messages within 1-2 business days.  For prescription refills, please ask your pharmacy to contact our office. Our fax number is [phone].  If you have an urgent issue when the clinic is closed that cannot wait until the next business day, you can page your doctor at the number below.    Please note that while we do our best to be available for urgent issues outside of office hours, we are not available 24/7.   If you have an urgent issue and are unable to reach us, you may choose to seek medical care at your doctor's office, retail clinic, urgent care center, or emergency room.  If you have a medical emergency, please immediately call 911 or go to the  emergency department.  Pager Numbers  - Dr. Kowalski: [phone]  - Dr. Moye: [phone]  - Dr. Stewart: [phone]  In the event of inclement weather, please call our main line at [phone] for an update on the status of any delays or closures.  Dermatology Medication Tips: Please keep the boxes that topical medications come in in order to help keep track of the instructions about where and how to use these. Pharmacies typically print the medication instructions only on the boxes and not directly on the medication tubes.   If your medication is too expensive, please contact our office at [phone] option 4 or send us a message through MyChart.   We are unable to tell what your co-pay for medications will be in advance as this is different depending on your insurance coverage. However, we may be able to find a substitute medication at lower cost or fill out paperwork to get insurance to cover a needed medication.   If a prior authorization is required to get your medication covered by your insurance company, please allow us 1-2 business days to complete this process.  Drug prices often vary depending on where the prescription is filled and some pharmacies may offer cheaper prices.  The website www.goodrx.com contains coupons for medications through different pharmacies. The prices here do not account for what the cost may be with help from insurance (it may be cheaper with your insurance), but the website can   give you the price if you did not use any insurance.  - You can print the associated coupon and take it with your prescription to the pharmacy.  - You may also stop by our office during regular business hours and pick up a GoodRx coupon card.  - If you need your prescription sent electronically to a different pharmacy, notify our office through [facility] MyChart or by phone at [phone] option 4.     Si Usted Necesita Algo Despus de Su Visita  Tambin puede  enviarnos un mensaje a travs de MyChart. Por lo general respondemos a los mensajes de MyChart en el transcurso de 1 a 2 das hbiles.  Para renovar recetas, por favor pida a su farmacia que se ponga en contacto con nuestra oficina. Nuestro nmero de fax es el [phone].  Si tiene un asunto urgente cuando la clnica est cerrada y que no puede esperar hasta el siguiente da hbil, puede llamar/localizar a su doctor(a) al nmero que aparece a continuacin.   Por favor, tenga en cuenta que aunque hacemos todo lo posible para estar disponibles para asuntos urgentes fuera del horario de oficina, no estamos disponibles las 24 horas del da, los 7 das de la semana.   Si tiene un problema urgente y no puede comunicarse con nosotros, puede optar por buscar atencin mdica  en el consultorio de su doctor(a), en una clnica privada, en un centro de atencin urgente o en una sala de emergencias.  Si tiene una emergencia mdica, por favor llame inmediatamente al 911 o vaya a la sala de emergencias.  Nmeros de bper  - Dr. Kowalski: [phone]  - Dra. Moye: [phone]  - Dra. Stewart: [phone]  En caso de inclemencias del tiempo, por favor llame a nuestra lnea principal al [phone] para una actualizacin sobre el estado de cualquier retraso o cierre.  Consejos para la medicacin en dermatologa: Por favor, guarde las cajas en las que vienen los medicamentos de uso tpico para ayudarle a seguir las instrucciones sobre dnde y cmo usarlos. Las farmacias generalmente imprimen las instrucciones del medicamento slo en las cajas y no directamente en los tubos del medicamento.   Si su medicamento es muy caro, por favor, pngase en contacto con nuestra oficina llamando al [phone] y presione la opcin 4 o envenos un mensaje a travs de MyChart.   No podemos decirle cul ser su copago por los medicamentos por adelantado ya que esto es diferente dependiendo de la cobertura de su seguro.  Sin embargo, es posible que podamos encontrar un medicamento sustituto a menor costo o llenar un formulario para que el seguro cubra el medicamento que se considera necesario.   Si se requiere una autorizacin previa para que su compaa de seguros cubra su medicamento, por favor permtanos de 1 a 2 das hbiles para completar este proceso.  Los precios de los medicamentos varan con frecuencia dependiendo del lugar de dnde se surte la receta y alguna farmacias pueden ofrecer precios ms baratos.  El sitio web www.goodrx.com tiene cupones para medicamentos de diferentes farmacias. Los precios aqu no tienen en cuenta lo que podra costar con la ayuda del seguro (puede ser ms barato con su seguro), pero el sitio web puede darle el precio si no utiliz ningn seguro.  - Puede imprimir el cupn correspondiente y llevarlo con su receta a la farmacia.  - Tambin puede pasar por nuestra oficina durante el horario de atencin regular y recoger una tarjeta de cupones de GoodRx.  -   Si necesita que su receta se enve electrnicamente a una farmacia diferente, informe a nuestra oficina a travs de MyChart de [facility] o por telfono llamando al [phone] y presione la opcin 4.  

## 2022-12-31 NOTE — Progress Notes (Signed)
   Follow-Up Visit   Subjective  Ann Osborne is a 72 y.o. female who presents for the following: ISK f/u (L forearm, 62mf/u s/p LN2). The patient has spots, moles and lesions to be evaluated, some may be new or changing and the patient has concerns that these could be cancer.  The following portions of the chart were reviewed this encounter and updated as appropriate:   Tobacco  Allergies  Meds  Problems  Med Hx  Surg Hx  Fam Hx     Review of Systems:  No other skin or systemic complaints except as noted in HPI or Assessment and Plan.  Objective  Well appearing patient in no apparent distress; mood and affect are within normal limits.  A focused examination was performed including left forearm. Relevant physical exam findings are noted in the Assessment and Plan.  Left Forearm Residual stuck on waxy paps with erythema   Assessment & Plan   Seborrheic Keratoses - Stuck-on, waxy, tan-brown papules and/or plaques  - Benign-appearing - Discussed benign etiology and prognosis. - Observe - Call for any changes  Inflamed seborrheic keratosis Left Forearm Symptomatic, irritating, patient would like treated. Destruction of lesion - Left Forearm Complexity: simple   Destruction method: cryotherapy   Informed consent: discussed and consent obtained   Timeout:  patient name, date of birth, surgical site, and procedure verified Lesion destroyed using liquid nitrogen: Yes   Region frozen until ice ball extended beyond lesion: Yes   Outcome: patient tolerated procedure well with no complications   Post-procedure details: wound care instructions given    Return in about 6 months (around 07/01/2023), or if symptoms worsen or fail to improve, for TBSE, Hx of BCC, Hx of Dysplastic nevi.  I, SOthelia Pulling RMA, am acting as scribe for DSarina Ser MD . Documentation: I have reviewed the above documentation for accuracy and completeness, and I agree with the above.  DSarina Ser MD

## 2023-01-06 ENCOUNTER — Encounter: Payer: Self-pay | Admitting: Dermatology

## 2023-02-13 ENCOUNTER — Other Ambulatory Visit: Payer: Self-pay

## 2023-02-13 MED ORDER — VENLAFAXINE HCL ER 75 MG PO CP24: 75.0000 mg | ORAL_CAPSULE | Freq: Every day | ORAL | 0 refills | Status: DC

## 2023-02-13 NOTE — Telephone Encounter (Signed)
Yes

## 2023-02-13 NOTE — Telephone Encounter (Signed)
Patient is needing a short supply of her effexor because mail order placed meds on her porch instead of her mail box while she was away for a few days and when she got back home meds were not there. Mail order is sending her a new script but she is going out of town before it arrives and needs a short supply to take with her. Are you ok to do 30 day to [facility] in [place]?

## 2023-02-13 NOTE — Telephone Encounter (Signed)
Rx sent in patient aware 

## 2023-02-13 NOTE — Telephone Encounter (Signed)
Prescription Request  02/13/2023  LOV: 09/01/2022  What is the name of the medication or equipment? venlafaxine XR (EFFEXOR-XR) 75 MG 24 hr capsule  Have you contacted your pharmacy to request a refill? No   Which pharmacy would you like this sent to?    [facility] [address] [place], [place] [address]    Patient notified that their request is being sent to the clinical staff for review and that they should receive a response within 2 business days.   Please advise at Mobile [phone] (mobile)   As per pt, because she's has been in the middle of moving from one place to the other, her last refills was misplaced, she never received it. As per pt, she will be going out of town so she just need like about 7 pills.

## 2023-03-03 ENCOUNTER — Encounter: Payer: Self-pay | Admitting: Internal Medicine

## 2023-03-03 ENCOUNTER — Ambulatory Visit (INDEPENDENT_AMBULATORY_CARE_PROVIDER_SITE_OTHER): Payer: Medicare Other | Admitting: Internal Medicine

## 2023-03-03 VITALS — BP 106/68 | HR 69 | Temp 97.8°F | Resp 16 | Ht 67.0 in | Wt 164.2 lb

## 2023-03-03 DIAGNOSIS — E559 Vitamin D deficiency, unspecified: Secondary | ICD-10-CM

## 2023-03-03 DIAGNOSIS — Z8639 Personal history of other endocrine, nutritional and metabolic disease: Secondary | ICD-10-CM

## 2023-03-03 DIAGNOSIS — F320 Major depressive disorder, single episode, mild: Secondary | ICD-10-CM

## 2023-03-03 DIAGNOSIS — E7800 Pure hypercholesterolemia, unspecified: Secondary | ICD-10-CM | POA: Diagnosis not present

## 2023-03-03 DIAGNOSIS — R928 Other abnormal and inconclusive findings on diagnostic imaging of breast: Secondary | ICD-10-CM | POA: Diagnosis not present

## 2023-03-03 DIAGNOSIS — R87619 Unspecified abnormal cytological findings in specimens from cervix uteri: Secondary | ICD-10-CM | POA: Diagnosis not present

## 2023-03-03 DIAGNOSIS — G4730 Sleep apnea, unspecified: Secondary | ICD-10-CM

## 2023-03-03 LAB — CBC WITH DIFFERENTIAL/PLATELET
Basophils Absolute: 0 10*3/uL (ref 0.0–0.1)
Basophils Relative: 0.4 % (ref 0.0–3.0)
Eosinophils Absolute: 0 10*3/uL (ref 0.0–0.7)
Eosinophils Relative: 0.8 % (ref 0.0–5.0)
HCT: 35.3 % — ABNORMAL LOW (ref 36.0–46.0)
Hemoglobin: 12.1 g/dL (ref 12.0–15.0)
Lymphocytes Relative: 30 % (ref 12.0–46.0)
Lymphs Abs: 1.5 10*3/uL (ref 0.7–4.0)
MCHC: 34.2 g/dL (ref 30.0–36.0)
MCV: 91.6 fl (ref 78.0–100.0)
Monocytes Absolute: 0.4 10*3/uL (ref 0.1–1.0)
Monocytes Relative: 7.8 % (ref 3.0–12.0)
Neutro Abs: 3 10*3/uL (ref 1.4–7.7)
Neutrophils Relative %: 61 % (ref 43.0–77.0)
Platelets: 206 10*3/uL (ref 150.0–400.0)
RBC: 3.85 Mil/uL — ABNORMAL LOW (ref 3.87–5.11)
RDW: 13.1 % (ref 11.5–15.5)
WBC: 4.9 10*3/uL (ref 4.0–10.5)

## 2023-03-03 LAB — HEPATIC FUNCTION PANEL
ALT: 31 U/L (ref 0–35)
AST: 28 U/L (ref 0–37)
Albumin: 4.1 g/dL (ref 3.5–5.2)
Alkaline Phosphatase: 76 U/L (ref 39–117)
Bilirubin, Direct: 0.1 mg/dL (ref 0.0–0.3)
Total Bilirubin: 0.6 mg/dL (ref 0.2–1.2)
Total Protein: 6.8 g/dL (ref 6.0–8.3)

## 2023-03-03 LAB — BASIC METABOLIC PANEL
BUN: 15 mg/dL (ref 6–23)
CO2: 29 mEq/L (ref 19–32)
Calcium: 8.9 mg/dL (ref 8.4–10.5)
Chloride: 103 mEq/L (ref 96–112)
Creatinine, Ser: 0.72 mg/dL (ref 0.40–1.20)
GFR: 82.51 mL/min (ref >60.00)
Glucose, Bld: 88 mg/dL (ref 70–99)
Potassium: 4 mEq/L (ref 3.5–5.1)
Sodium: 139 mEq/L (ref 135–145)

## 2023-03-03 LAB — LIPID PANEL
Cholesterol: 179 mg/dL (ref 0–200)
HDL: 35.9 mg/dL — ABNORMAL LOW (ref >39.00)
LDL Cholesterol: 124 mg/dL — ABNORMAL HIGH (ref 0–99)
NonHDL: 142.6
Total CHOL/HDL Ratio: 5
Triglycerides: 93 mg/dL (ref 0.0–149.0)
VLDL: 18.6 mg/dL (ref 0.0–40.0)

## 2023-03-03 MED ORDER — VENLAFAXINE HCL ER 75 MG PO CP24: 75.0000 mg | ORAL_CAPSULE | Freq: Every day | ORAL | 1 refills | Status: DC

## 2023-03-03 NOTE — Progress Notes (Unsigned)
Subjective:    [name], female    DOB: 1951-04-02, 72 y.o.   MRN: [id number]  Patient here for  Chief Complaint  Patient presents with   Medical Management of Chronic Issues    HPI Here to follow up regarding OSA - CPAP and increased stress.  On effexor.  Doing well.  Stress is better.  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  Husband is doing better.  She has adjusted her diet.  Stays active. Lost weight.  Feels better.  No chest pain or sob reported.  No increased cough or congestion.  Off omeprazole 5-6 weeks.  No acid reflux.  Discussed remaining off and following symptoms.  No nausea or vomiting.  No abdominal pain or bowel change reported.  Previous mammogram 07/2022 - recommended right diagnostic mammogram and ultrasound - 6 months.    Past Medical History:  Diagnosis Date   ADD (attention deficit disorder)    Allergy    Basal cell carcinoma 08/05/2007   left upper back medial mid scapula    Depression    Dysplastic nevus 03/27/2016   left infrascapular, moderate atypia    GERD (gastroesophageal reflux disease)    Hyperparathyroidism    Lyme disease 6-7 YEARS   history   Past Surgical History:  Procedure Laterality Date   BREAST BIOPSY Left 20 yrs ago   EYE SURGERY Bilateral 2007 OR 2008   LENS REPLACEMENT    THYROIDECTOMY Left 11/19/2017   Procedure: LEFT INFERIOR THYROIDECTOMY;  Surgeon: Darnell Level, MD;  Location: [facility];  Service: General;  Laterality: Left;   TUBAL LIGATION  1978   bilateral   WISDOM TOOTH EXTRACTION  1980   Family History  Problem Relation Age of Onset   Uterine cancer Mother    Heart disease Maternal Grandmother    Alcohol abuse Maternal Grandfather    Parkinson's disease Paternal Grandmother    Depression Paternal Grandfather    Breast cancer Neg Hx    Social History   Socioeconomic History   Marital status: Married    Spouse name: Not on file   Number of children: 2   Years of education: Not on file   Highest education level: Not on  file  Occupational History   Not on file  Tobacco Use   Smoking status: Never   Smokeless tobacco: Never  Vaping Use   Vaping Use: Never used  Substance and Sexual Activity   Alcohol use: Yes    Alcohol/week: 0.0 standard drinks of alcohol    Comment: occasional   Drug use: No   Sexual activity: Not Currently  Other Topics Concern   Not on file  Social History Narrative   Not on file   Social Determinants of Health   Financial Resource Strain: Low Risk  (04/08/2022)   Overall Financial Resource Strain (CARDIA)    Difficulty of Paying Living Expenses: Not hard at all  Food Insecurity: No Food Insecurity (04/08/2022)   Hunger Vital Sign    Worried About Running Out of Food in the Last Year: Never true    Ran Out of Food in the Last Year: Never true  Transportation Needs: No Transportation Needs (04/08/2022)   PRAPARE - Administrator, Civil Service (Medical): No    Lack of Transportation (Non-Medical): No  Physical Activity: Sufficiently Active (04/08/2022)   Exercise Vital Sign    Days of Exercise per Week: 4 days    Minutes of Exercise per Session: 60 min  Stress: No  Stress Concern Present (04/08/2022)   [employer] of Occupational Health - Occupational Stress Questionnaire    Feeling of Stress : Not at all  Social Connections: Unknown (04/08/2022)   Social Connection and Isolation Panel [NHANES]    Frequency of Communication with Friends and Family: More than three times a week    Frequency of Social Gatherings with Friends and Family: Not on file    Attends Religious Services: More than 4 times per year    Active Member of [employer] or Organizations: Not on file    Attends Banker Meetings: Not on file    Marital Status: Married     Review of Systems  Constitutional:  Negative for appetite change and unexpected weight change.  HENT:  Negative for congestion and sinus pressure.   Respiratory:  Negative for cough, chest tightness and shortness  of breath.   Cardiovascular:  Negative for chest pain and palpitations.  Gastrointestinal:  Negative for abdominal pain, diarrhea, nausea and vomiting.  Genitourinary:  Negative for difficulty urinating and dysuria.  Musculoskeletal:  Negative for joint swelling and myalgias.  Skin:  Negative for color change and rash.  Neurological:  Negative for dizziness and headaches.  Psychiatric/Behavioral:  Negative for agitation and dysphoric mood.        Objective:     BP 106/68   Pulse 69   Temp 97.8 F (36.6 C)   Resp 16   Ht  (1.702 m)   Wt 164 lb 3.2 oz (74.5 kg)   LMP 07/16/1995 (LMP Unknown)   SpO2 99%   BMI 25.72 kg/m  Wt Readings from Last 3 Encounters:  03/03/23 164 lb 3.2 oz (74.5 kg)  09/01/22 183 lb 12.8 oz (83.4 kg)  04/08/22 186 lb (84.4 kg)    Physical Exam Vitals reviewed.  Constitutional:      General: She is not in acute distress.    Appearance: Normal appearance.  HENT:     Head: Normocephalic and atraumatic.     Right Ear: External ear normal.     Left Ear: External ear normal.  Eyes:     General: No scleral icterus.       Right eye: No discharge.        Left eye: No discharge.     Conjunctiva/sclera: Conjunctivae normal.  Neck:     Thyroid: No thyromegaly.  Cardiovascular:     Rate and Rhythm: Normal rate and regular rhythm.  Pulmonary:     Effort: No respiratory distress.     Breath sounds: Normal breath sounds. No wheezing.  Abdominal:     General: Bowel sounds are normal.     Palpations: Abdomen is soft.     Tenderness: There is no abdominal tenderness.  Musculoskeletal:        General: No swelling or tenderness.     Cervical back: Neck supple. No tenderness.  Lymphadenopathy:     Cervical: No cervical adenopathy.  Skin:    Findings: No erythema or rash.  Neurological:     Mental Status: She is alert.  Psychiatric:        Mood and Affect: Mood normal.        Behavior: Behavior normal.      Outpatient Encounter Medications as  of 03/03/2023  Medication Sig   Alpha-Lipoic Acid 50 MG TABS Take by mouth daily.   Multiple Vitamin (MULTIVITAMIN) capsule Take 1 capsule by mouth daily.   Multiple Vitamins-Minerals (ICAPS AREDS 2 PO) Take by mouth 2 (two)  times daily.   Ruxolitinib Phosphate (OPZELURA) 1.5 % CREA Apply 1 application topically 2 (two) times daily. Apply to itchy areas on arms QD-BID PRN flares.   venlafaxine XR (EFFEXOR-XR) 75 MG 24 hr capsule Take 1 capsule (75 mg total) by mouth daily.   [DISCONTINUED] omeprazole (PRILOSEC) 20 MG capsule Take 1 capsule (20 mg total) by mouth 2 (two) times daily before a meal.   [DISCONTINUED] venlafaxine XR (EFFEXOR-XR) 75 MG 24 hr capsule Take 1 capsule (75 mg total) by mouth daily.   No facility-administered encounter medications on file as of 03/03/2023.     Lab Results  Component Value Date   WBC 4.9 03/03/2023   HGB 12.1 03/03/2023   HCT 35.3 (L) 03/03/2023   PLT 206.0 03/03/2023   GLUCOSE 88 03/03/2023   CHOL 179 03/03/2023   TRIG 93.0 03/03/2023   HDL 35.90 (L) 03/03/2023   LDLDIRECT 151.0 04/03/2020   LDLCALC 124 (H) 03/03/2023   ALT 31 03/03/2023   AST 28 03/03/2023   NA 139 03/03/2023   K 4.0 03/03/2023   CL 103 03/03/2023   CREATININE 0.72 03/03/2023   BUN 15 03/03/2023   CO2 29 03/03/2023   TSH 2.37 09/01/2022    MM DIAG BREAST TOMO UNI RIGHT  Result Date: 07/30/2022 CLINICAL DATA:  Callback for RIGHT breast mass.  History of cysts EXAM: DIGITAL DIAGNOSTIC UNILATERAL RIGHT MAMMOGRAM WITH TOMOSYNTHESIS; ULTRASOUND RIGHT BREAST LIMITED TECHNIQUE: Right digital diagnostic mammography and breast tomosynthesis was performed.; Targeted ultrasound examination of the right breast was performed COMPARISON:  Previous exam(s). ACR Breast Density Category b: There are scattered areas of fibroglandular density. FINDINGS: Additional diagnostic images demonstrate a persistent oval circumscribed mass in the RIGHT upper inner breast at anterior depth. On physical  exam, no suspicious mass is appreciated. Targeted ultrasound was performed of the RIGHT upper inner breast. At 2 o'clock 3 cm from the nipple, there is an oval circumscribed near anechoic mass with suggestion of multiple thin internal septations. It measures approximately 3 x 2 x 3 mm. This corresponds to the site of screening mammographic concern and likely reflects a minimally complicated cyst. IMPRESSION: There is a probably benign mass at the site of screening mammographic concern which is favored to reflect a complicated cyst. Recommend follow-up mammogram and ultrasound in 6 months. This will establish 6 months of definitive stability/appropriate evolution. RECOMMENDATION: RIGHT diagnostic mammogram and ultrasound in 6 months I have discussed the findings and recommendations with the patient. If applicable, a reminder letter will be sent to the patient regarding the next appointment. BI-RADS CATEGORY  3: Probably benign. Electronically Signed   By: Meda Klinefelter M.D.   On: 07/30/2022 15:49  US BREAST LTD UNI RIGHT INC AXILLA  Result Date: 07/30/2022 CLINICAL DATA:  Callback for RIGHT breast mass.  History of cysts EXAM: DIGITAL DIAGNOSTIC UNILATERAL RIGHT MAMMOGRAM WITH TOMOSYNTHESIS; ULTRASOUND RIGHT BREAST LIMITED TECHNIQUE: Right digital diagnostic mammography and breast tomosynthesis was performed.; Targeted ultrasound examination of the right breast was performed COMPARISON:  Previous exam(s). ACR Breast Density Category b: There are scattered areas of fibroglandular density. FINDINGS: Additional diagnostic images demonstrate a persistent oval circumscribed mass in the RIGHT upper inner breast at anterior depth. On physical exam, no suspicious mass is appreciated. Targeted ultrasound was performed of the RIGHT upper inner breast. At 2 o'clock 3 cm from the nipple, there is an oval circumscribed near anechoic mass with suggestion of multiple thin internal septations. It measures approximately 3 x  2 x 3 mm.  This corresponds to the site of screening mammographic concern and likely reflects a minimally complicated cyst. IMPRESSION: There is a probably benign mass at the site of screening mammographic concern which is favored to reflect a complicated cyst. Recommend follow-up mammogram and ultrasound in 6 months. This will establish 6 months of definitive stability/appropriate evolution. RECOMMENDATION: RIGHT diagnostic mammogram and ultrasound in 6 months I have discussed the findings and recommendations with the patient. If applicable, a reminder letter will be sent to the patient regarding the next appointment. BI-RADS CATEGORY  3: Probably benign. Electronically Signed   By: Meda Klinefelter M.D.   On: 07/30/2022 15:49      Assessment & Plan:  Hypercholesterolemia Assessment & Plan: The 10-year ASCVD risk score (Arnett DK, et al., 2019) is: 10.2%   Values used to calculate the score:     Age: 75 years     Sex: Female     Is Non-Hispanic African American: No     Diabetic: No     Tobacco smoker: No     Systolic Blood Pressure: 106 mmHg     Is BP treated: No     HDL Cholesterol: 35.9 mg/dL     Total Cholesterol: 179 mg/dL  Have discussed calculated cholesterol risk.  Has wanted to continue diet and exercise.  She has adjusted diet.  Lost weight. Follow lipid panel.   Orders: -     CBC with Differential/Platelet -     Basic metabolic panel -     Hepatic function panel -     Lipid panel  Vitamin D deficiency Assessment & Plan: Check vitamin D level.     Abnormal mammogram Assessment & Plan: Discussed previous mammogram - recommended 6 month f/u right breast.  Agreeable to f/u - diagnostic right mammogram with ultrasound.   Orders: -     [place] LIMITED ULTRASOUND INCLUDING AXILLA RIGHT BREAST; Future -     MM 3D DIAGNOSTIC MAMMOGRAM BILATERAL BREAST; Future  Abnormal cervical Papanicolaou smear, unspecified abnormal pap finding Assessment & Plan: Saw Dr Dalbert Garnet 01/2022.   Recommended f/u one year.  Need to confirm f/u.  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    Depression, major, single episode, mild Assessment & Plan: Continue effexor.  Doing better.  Follow.    History of hyperparathyroidism Assessment & Plan: S/p left parathyroidectomy.  Follow calcium.    Sleep apnea, unspecified type Assessment & Plan: Continue cpap.    Other orders -     Venlafaxine HCl ER; Take 1 capsule (75 mg total) by mouth daily.  Dispense: 90 capsule; Refill: 1     Dale , MD

## 2023-03-04 ENCOUNTER — Encounter: Payer: Self-pay | Admitting: Internal Medicine

## 2023-03-04 ENCOUNTER — Other Ambulatory Visit: Payer: Self-pay | Admitting: Internal Medicine

## 2023-03-04 DIAGNOSIS — R928 Other abnormal and inconclusive findings on diagnostic imaging of breast: Secondary | ICD-10-CM | POA: Insufficient documentation

## 2023-03-04 DIAGNOSIS — N63 Unspecified lump in unspecified breast: Secondary | ICD-10-CM

## 2023-03-04 NOTE — Assessment & Plan Note (Signed)
S/p left parathyroidectomy.  Follow calcium.  

## 2023-03-04 NOTE — Assessment & Plan Note (Signed)
Continue effexor.  Doing better.  Follow.

## 2023-03-04 NOTE — Assessment & Plan Note (Signed)
Continue cpap.  

## 2023-03-04 NOTE — Assessment & Plan Note (Signed)
Saw Dr Dalbert Garnet 01/2022.  Recommended f/u one year.  Need to confirm f/u.  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

## 2023-03-04 NOTE — Assessment & Plan Note (Signed)
Discussed previous mammogram - recommended 6 month f/u right breast.  Agreeable to f/u - diagnostic right mammogram with ultrasound.

## 2023-03-04 NOTE — Assessment & Plan Note (Addendum)
The 10-year ASCVD risk score (Arnett DK, et al., 2019) is: 10.2%   Values used to calculate the score:     Age: 72 years     Sex: Female     Is Non-Hispanic African American: No     Diabetic: No     Tobacco smoker: No     Systolic Blood Pressure: 106 mmHg     Is BP treated: No     HDL Cholesterol: 35.9 mg/dL     Total Cholesterol: 179 mg/dL  Have discussed calculated cholesterol risk.  Has wanted to continue diet and exercise.  She has adjusted diet.  Lost weight. Follow lipid panel.

## 2023-03-04 NOTE — Assessment & Plan Note (Signed)
Check vitamin D level 

## 2023-03-30 ENCOUNTER — Ambulatory Visit
Admission: RE | Admit: 2023-03-30 | Discharge: 2023-03-30 | Disposition: A | Discharge status: Home / Self Care | Payer: Medicare Other | Source: Ambulatory Visit | Attending: Internal Medicine | Admitting: Internal Medicine

## 2023-03-30 DIAGNOSIS — N63 Unspecified lump in unspecified breast: Secondary | ICD-10-CM | POA: Diagnosis present

## 2023-03-30 DIAGNOSIS — R928 Other abnormal and inconclusive findings on diagnostic imaging of breast: Secondary | ICD-10-CM | POA: Diagnosis present

## 2023-03-31 ENCOUNTER — Telehealth: Payer: Self-pay

## 2023-03-31 ENCOUNTER — Other Ambulatory Visit: Payer: Self-pay | Admitting: Internal Medicine

## 2023-03-31 DIAGNOSIS — R928 Other abnormal and inconclusive findings on diagnostic imaging of breast: Secondary | ICD-10-CM

## 2023-03-31 NOTE — Progress Notes (Signed)
Order placed for f/u mammogram and right breast ultrasound due 07/2023.

## 2023-03-31 NOTE — Telephone Encounter (Signed)
-----   Message from Dale Guernsey, MD sent at 03/31/2023  5:14 AM EDT ----- Please call and notify - I reviewed her f/u mammogram report and radiology felt changes were benign.  Recommended a f/u bilateral mammogram and right breast ultrasound in 07/2023.  Orders placed. Someone should be contacting her with an appt date and time.  Let us know if not scheduled.

## 2023-04-20 ENCOUNTER — Ambulatory Visit (INDEPENDENT_AMBULATORY_CARE_PROVIDER_SITE_OTHER): Payer: Medicare Other

## 2023-04-20 VITALS — Ht 67.0 in | Wt 164.0 lb

## 2023-04-20 DIAGNOSIS — Z0000 Encounter for general adult medical examination without abnormal findings: Secondary | ICD-10-CM

## 2023-04-20 NOTE — Progress Notes (Signed)
I connected with  Ann Osborne on 04/20/23 by a audio enabled telemedicine application and verified that I am speaking with the correct person using two identifiers.  Patient Location: Home  Provider Location: Office/Clinic  I discussed the limitations of evaluation and management by telemedicine. The patient expressed understanding and agreed to proceed.  Subjective:   Ann Osborne is a 72 y.o. female who presents for Medicare Annual (Subsequent) preventive examination.  Review of Systems     Cardiac Risk Factors include: advanced age (>39men, >87 women)     Objective:    Today's Vitals   04/20/23 1115  Weight: 164 lb (74.4 kg)  Height: 5\' 7"  (1.702 m)   Body mass index is 25.69 kg/m.     04/20/2023   11:06 AM 04/08/2022    1:08 PM 04/05/2021   10:06 AM 04/03/2020    9:40 AM 04/01/2019   11:38 AM 11/19/2017    6:55 AM 11/09/2017    1:22 PM  Advanced Directives  Does Patient Have a Medical Advance Directive? Yes Yes Yes Yes Yes Yes Yes  Type of Estate agent of [place];Living will Healthcare Power of [place];Living will Healthcare Power of [place];Living will Healthcare Power of [place];Living will Healthcare Power of [place];Living will Living will Living will  Does patient want to make changes to medical advance directive? No - Patient declined No - Patient declined No - Patient declined No - Patient declined No - Patient declined  No - Patient declined  Copy of Healthcare Power of Attorney in Chart? Yes - validated most recent copy scanned in chart (See row information) No - copy requested Yes - validated most recent copy scanned in chart (See row information) Yes - validated most recent copy scanned in chart (See row information) Yes - validated most recent copy scanned in chart (See row information) No - copy requested No - copy requested    Current Medications (verified) Outpatient Encounter Medications as of 04/20/2023  Medication Sig    Alpha-Lipoic Acid 50 MG TABS Take by mouth daily.   Multiple Vitamin (MULTIVITAMIN) capsule Take 1 capsule by mouth daily.   Multiple Vitamins-Minerals (ICAPS AREDS 2 PO) Take by mouth 2 (two) times daily.   Ruxolitinib Phosphate (OPZELURA) 1.5 % CREA Apply 1 application topically 2 (two) times daily. Apply to itchy areas on arms QD-BID PRN flares.   venlafaxine XR (EFFEXOR-XR) 75 MG 24 hr capsule Take 1 capsule (75 mg total) by mouth daily.   No facility-administered encounter medications on file as of 04/20/2023.    Allergies (verified) Sulfa antibiotics, Compazine [prochlorperazine edisylate], and Prochlorperazine   History: Past Medical History:  Diagnosis Date   ADD (attention deficit disorder)    Allergy    Basal cell carcinoma 08/05/2007   left upper back medial mid scapula    Depression    Dysplastic nevus 03/27/2016   left infrascapular, moderate atypia    GERD (gastroesophageal reflux disease)    Hyperparathyroidism (HCC)    Lyme disease 6-7 YEARS   history   Past Surgical History:  Procedure Laterality Date   BREAST BIOPSY Left 20 yrs ago   EYE SURGERY Bilateral 2007 OR 2008   LENS REPLACEMENT    THYROIDECTOMY Left 11/19/2017   Procedure: LEFT INFERIOR THYROIDECTOMY;  Surgeon: Darnell Level, MD;  Location: [facility];  Service: General;  Laterality: Left;   TUBAL LIGATION  1978   bilateral   WISDOM TOOTH EXTRACTION  1980   Family History  Problem Relation Age of  Onset   Uterine cancer Mother    Heart disease Maternal Grandmother    Alcohol abuse Maternal Grandfather    Parkinson's disease Paternal Grandmother    Depression Paternal Grandfather    Breast cancer Neg Hx    Social History   Socioeconomic History   Marital status: Married    Spouse name: Not on file   Number of children: 2   Years of education: Not on file   Highest education level: Not on file  Occupational History   Not on file  Tobacco Use   Smoking status: Never   Smokeless tobacco: Never   Vaping Use   Vaping Use: Never used  Substance and Sexual Activity   Alcohol use: Yes    Alcohol/week: 0.0 standard drinks of alcohol    Comment: occasional   Drug use: No   Sexual activity: Not Currently  Other Topics Concern   Not on file  Social History Narrative   Not on file   Social Determinants of Health   Financial Resource Strain: Low Risk  (04/20/2023)   Overall Financial Resource Strain (CARDIA)    Difficulty of Paying Living Expenses: Not hard at all  Food Insecurity: No Food Insecurity (04/20/2023)   Hunger Vital Sign    Worried About Running Out of Food in the Last Year: Never true    Ran Out of Food in the Last Year: Never true  Transportation Needs: No Transportation Needs (04/20/2023)   PRAPARE - Administrator, Civil Service (Medical): No    Lack of Transportation (Non-Medical): No  Physical Activity: Sufficiently Active (04/20/2023)   Exercise Vital Sign    Days of Exercise per Week: 5 days    Minutes of Exercise per Session: 30 min  Stress: No Stress Concern Present (04/20/2023)   [employer] of Occupational Health - Occupational Stress Questionnaire    Feeling of Stress : Not at all  Social Connections: Moderately Integrated (04/20/2023)   Social Connection and Isolation Panel [NHANES]    Frequency of Communication with Friends and Family: More than three times a week    Frequency of Social Gatherings with Friends and Family: More than three times a week    Attends Religious Services: More than 4 times per year    Active Member of [employer] or Organizations: No    Attends Engineer, structural: Never    Marital Status: Married    Tobacco Counseling Counseling given: Not Answered   Clinical Intake:  Pre-visit preparation completed: Yes  Pain : No/denies pain     Nutritional Risks: None Diabetes: No  How often do you need to have someone help you when you read instructions, pamphlets, or other written materials from your  doctor or pharmacy?: 1 - Never  Diabetic?no  Interpreter Needed?: No  Information entered by :: Kennedy Bucker, LPN   Activities of Daily Living    04/20/2023   11:07 AM 04/19/2023   11:22 PM  In your present state of health, do you have any difficulty performing the following activities:  Hearing? 0 0  Vision? 0   Difficulty concentrating or making decisions? 0 0  Walking or climbing stairs? 0 0  Dressing or bathing? 0 0  Doing errands, shopping? 0 0  Preparing Food and eating ? N N  Using the Toilet? N N  In the past six months, have you accidently leaked urine? N N  Do you have problems with loss of bowel control? N N  Managing your  Medications? N N  Managing your Finances? N N  Housekeeping or managing your Housekeeping? N N    Patient Care Team: Dale Argos, MD as PCP - General (Internal Medicine)  Indicate any recent Medical Services you may have received from other than [facility] providers in the past year (date may be approximate).     Assessment:   This is a routine wellness examination for Ann Osborne.  Hearing/Vision screen Hearing Screening - Comments:: Wears aids Vision Screening - Comments:: Glasses for distance- Dr.Porfilio Has macular degeneration  Dietary issues and exercise activities discussed: Current Exercise Habits: Home exercise routine, Type of exercise: walking, Time (Minutes): 40, Frequency (Times/Week): 5, Weekly Exercise (Minutes/Week): 200, Intensity: Mild   Goals Addressed             This Visit's Progress    DIET - EAT MORE FRUITS AND VEGETABLES         Depression Screen    04/20/2023   11:04 AM 03/03/2023   10:01 AM 09/01/2022   10:31 AM 04/08/2022    1:07 PM 03/07/2022    9:34 AM 07/16/2021   10:35 AM 04/05/2021   10:07 AM  PHQ 2/9 Scores  PHQ - 2 Score 0 0 0 0 2 1 0  PHQ- 9 Score 0 0  0 3 3 0    Fall Risk    04/20/2023   11:07 AM 04/19/2023   11:22 PM 09/01/2022   10:30 AM 04/08/2022    1:09 PM 03/07/2022    9:34 AM  Fall Risk    Falls in the past year? 0 0 0 0 0  Number falls in past yr: 0 0 0 0   Injury with Fall? 0 0 0    Risk for fall due to : No Fall Risks  No Fall Risks  No Fall Risks  Follow up Falls prevention discussed;Falls evaluation completed  Falls evaluation completed Falls evaluation completed Falls evaluation completed    FALL RISK PREVENTION PERTAINING TO THE HOME:  Any stairs in or around the home? Yes  If so, are there any without handrails? No  Home free of loose throw rugs in walkways, pet beds, electrical cords, etc? Yes  Adequate lighting in your home to reduce risk of falls? Yes   ASSISTIVE DEVICES UTILIZED TO PREVENT FALLS:  Life alert? No  Use of a cane, walker or w/c? No  Grab bars in the bathroom? Yes  Shower chair or bench in shower? No  Elevated toilet seat or a handicapped toilet? No   Cognitive Function:     10/29/2017   10:35 AM  MMSE - Mini Mental State Exam  Orientation to time 5  Orientation to Place 5  Registration 3  Attention/ Calculation 5  Recall 3  Language- name 2 objects 2  Language- repeat 1  Language- follow 3 step command 3  Language- read & follow direction 1  Write a sentence 1  Copy design 1  Total score 30        04/20/2023   11:11 AM 04/01/2019   12:02 PM 10/28/2016   10:40 AM  6CIT Screen  What Year? 0 points 0 points 0 points  What month? 0 points 0 points 0 points  What time? 0 points 0 points 0 points  Count back from 20 0 points 0 points 0 points  Months in reverse 0 points 0 points 0 points  Repeat phrase 0 points 0 points 0 points  Total Score 0 points 0 points 0 points  Immunizations Immunization History  Administered Date(s) Administered   Fluad Quad(high Dose 65+) 08/06/2019, 08/07/2020, 07/16/2021, 09/01/2022   Influenza, High Dose Seasonal PF 09/02/2018   Influenza, Quadrivalent, Recombinant, Inj, Pf 08/19/2017   Influenza,inj,Quad PF,6+ Mos 08/25/2013, 08/02/2014   Influenza-Unspecified 08/20/2015, 08/19/2016    Moderna SARS-COV2 Booster Vaccination 12/31/2020   Moderna Sars-Covid-2 Vaccination 02/03/2020, 03/07/2020, 12/31/2020   Pneumococcal Conjugate-13 08/19/2013   Pneumococcal Polysaccharide-23 08/19/2012, 10/29/2017   Tdap 07/18/2021   Zoster Recombinat (Shingrix) 08/11/2014    TDAP status: Up to date  Flu Vaccine status: Up to date  Pneumococcal vaccine status: Up to date  Covid-19 vaccine status: Completed vaccines  Qualifies for Shingles Vaccine? Yes   Zostavax completed Yes   Shingrix Completed?: No.    Education has been provided regarding the importance of this vaccine. Patient has been advised to call insurance company to determine out of pocket expense if they have not yet received this vaccine. Advised may also receive vaccine at local pharmacy or Health Dept. Verbalized acceptance and understanding.  Screening Tests Health Maintenance  Topic Date Due   COVID-19 Vaccine (4 - 2023-24 season) 07/11/2022   Zoster Vaccines- Shingrix (2 of 2) 06/02/2023 (Originally 10/06/2014)   INFLUENZA VACCINE  06/11/2023   MAMMOGRAM  07/09/2023   Medicare Annual Wellness (AWV)  04/19/2024   Colonoscopy  01/14/2025   DTaP/Tdap/Td (2 - Td or Tdap) 07/19/2031   Pneumonia Vaccine 67+ Years old  Completed   DEXA SCAN  Completed   Hepatitis C Screening  Completed   HPV VACCINES  Aged Out    Health Maintenance  Health Maintenance Due  Topic Date Due   COVID-19 Vaccine (4 - 2023-24 season) 07/11/2022  Had 1st Shingrix 08/11/14  Colorectal cancer screening: No longer required.   Mammogram status: No longer required due to 03/31/23.- has appt for September 2024  Bone Density status: Completed 11/13/20. Results reflect: Bone density results: NORMAL. Repeat every 5 years.  Lung Cancer Screening: (Low Dose CT Chest recommended if Age 78-80 years, 30 pack-year currently smoking OR have quit w/in 15years.) does not qualify.    Additional Screening:  Hepatitis C Screening: does qualify;  Completed 10/12/15  Vision Screening: Recommended annual ophthalmology exams for early detection of glaucoma and other disorders of the eye. Is the patient up to date with their annual eye exam?  Yes  Who is the provider or what is the name of the office in which the patient attends annual eye exams? Dr.Porfilio If pt is not established with a provider, would they like to be referred to a provider to establish care? No .   Dental Screening: Recommended annual dental exams for proper oral hygiene  Community Resource Referral / Chronic Care Management: CRR required this visit?  No   CCM required this visit?  No      Plan:     I have personally reviewed and noted the following in the patient's chart:   Medical and social history Use of alcohol, tobacco or illicit drugs  Current medications and supplements including opioid prescriptions. Patient is not currently taking opioid prescriptions. Functional ability and status Nutritional status Physical activity Advanced directives List of other physicians Hospitalizations, surgeries, and ER visits in previous 12 months Vitals Screenings to include cognitive, depression, and falls Referrals and appointments  In addition, I have reviewed and discussed with patient certain preventive protocols, quality metrics, and best practice recommendations. A written personalized care plan for preventive services as well as general preventive health recommendations were provided  to patient.     Hal Hope, LPN   1/61/0960   Nurse Notes: none

## 2023-04-20 NOTE — Patient Instructions (Signed)
Ms. Ann Osborne , Thank you for taking time to come for your Medicare Wellness Visit. I appreciate your ongoing commitment to your health goals. Please review the following plan we discussed and let me know if I can assist you in the future.   These are the goals we discussed:  Goals       DIET - EAT MORE FRUITS AND VEGETABLES      I would like to lose about 10lbs (pt-stated)      Weight desired 165-170lb        This is a list of the screening recommended for you and due dates:  Health Maintenance  Topic Date Due   COVID-19 Vaccine (4 - 2023-24 season) 07/11/2022   Zoster (Shingles) Vaccine (2 of 2) 06/02/2023*   Flu Shot  06/11/2023   Mammogram  07/09/2023   Medicare Annual Wellness Visit  04/19/2024   Colon Cancer Screening  01/14/2025   DTaP/Tdap/Td vaccine (2 - Td or Tdap) 07/19/2031   Pneumonia Vaccine  Completed   DEXA scan (bone density measurement)  Completed   Hepatitis C Screening  Completed   HPV Vaccine  Aged Out  *Topic was postponed. The date shown is not the original due date.    Advanced directives: yes  Conditions/risks identified: none  Next appointment: Follow up in one year for your annual wellness visit 04/20/24 @ 9:15 am by phone   Preventive Care 65 Years and Older, Female Preventive care refers to lifestyle choices and visits with your health care provider that can promote health and wellness. What does preventive care include? A yearly physical exam. This is also called an annual well check. Dental exams once or twice a year. Routine eye exams. Ask your health care provider how often you should have your eyes checked. Personal lifestyle choices, including: Daily care of your teeth and gums. Regular physical activity. Eating a healthy diet. Avoiding tobacco and drug use. Limiting alcohol use. Practicing safe sex. Taking low-dose aspirin every day. Taking vitamin and mineral supplements as recommended by your health care provider. What happens  during an annual well check? The services and screenings done by your health care provider during your annual well check will depend on your age, overall health, lifestyle risk factors, and family history of disease. Counseling  Your health care provider may ask you questions about your: Alcohol use. Tobacco use. Drug use. Emotional well-being. Home and relationship well-being. Sexual activity. Eating habits. History of falls. Memory and ability to understand (cognition). Work and work Astronomer. Reproductive health. Screening  You may have the following tests or measurements: Height, weight, and BMI. Blood pressure. Lipid and cholesterol levels. These may be checked every 5 years, or more frequently if you are over 74 years old. Skin check. Lung cancer screening. You may have this screening every year starting at age 35 if you have a 30-pack-year history of smoking and currently smoke or have quit within the past 15 years. Fecal occult blood test (FOBT) of the stool. You may have this test every year starting at age 22. Flexible sigmoidoscopy or colonoscopy. You may have a sigmoidoscopy every 5 years or a colonoscopy every 10 years starting at age 73. Hepatitis C blood test. Hepatitis B blood test. Sexually transmitted disease (STD) testing. Diabetes screening. This is done by checking your blood sugar (glucose) after you have not eaten for a while (fasting). You may have this done every 1-3 years. Bone density scan. This is done to screen for osteoporosis. You may  have this done starting at age 73. Mammogram. This may be done every 1-2 years. Talk to your health care provider about how often you should have regular mammograms. Talk with your health care provider about your test results, treatment options, and if necessary, the need for more tests. Vaccines  Your health care provider may recommend certain vaccines, such as: Influenza vaccine. This is recommended every  year. Tetanus, diphtheria, and acellular pertussis (Tdap, Td) vaccine. You may need a Td booster every 10 years. Zoster vaccine. You may need this after age 33. Pneumococcal 13-valent conjugate (PCV13) vaccine. One dose is recommended after age 56. Pneumococcal polysaccharide (PPSV23) vaccine. One dose is recommended after age 60. Talk to your health care provider about which screenings and vaccines you need and how often you need them. This information is not intended to replace advice given to you by your health care provider. Make sure you discuss any questions you have with your health care provider. Document Released: 11/23/2015 Document Revised: 07/16/2016 Document Reviewed: 08/28/2015 Elsevier Interactive Patient Education  2017 [place] Prevention in the Home Falls can cause injuries. They can happen to people of all ages. There are many things you can do to make your home safe and to help prevent falls. What can I do on the outside of my home? Regularly fix the edges of walkways and driveways and fix any cracks. Remove anything that might make you trip as you walk through a door, such as a raised step or threshold. Trim any bushes or trees on the path to your home. Use bright outdoor lighting. Clear any walking paths of anything that might make someone trip, such as rocks or tools. Regularly check to see if handrails are loose or broken. Make sure that both sides of any steps have handrails. Any raised decks and porches should have guardrails on the edges. Have any leaves, snow, or ice cleared regularly. Use sand or salt on walking paths during winter. Clean up any spills in your garage right away. This includes oil or grease spills. What can I do in the bathroom? Use night lights. Install grab bars by the toilet and in the tub and shower. Do not use towel bars as grab bars. Use non-skid mats or decals in the tub or shower. If you need to sit down in the shower, use a  plastic, non-slip stool. Keep the floor dry. Clean up any water that spills on the floor as soon as it happens. Remove soap buildup in the tub or shower regularly. Attach bath mats securely with double-sided non-slip rug tape. Do not have throw rugs and other things on the floor that can make you trip. What can I do in the bedroom? Use night lights. Make sure that you have a light by your bed that is easy to reach. Do not use any sheets or blankets that are too big for your bed. They should not hang down onto the floor. Have a firm chair that has side arms. You can use this for support while you get dressed. Do not have throw rugs and other things on the floor that can make you trip. What can I do in the kitchen? Clean up any spills right away. Avoid walking on wet floors. Keep items that you use a lot in easy-to-reach places. If you need to reach something above you, use a strong step stool that has a grab bar. Keep electrical cords out of the way. Do not use floor polish  or wax that makes floors slippery. If you must use wax, use non-skid floor wax. Do not have throw rugs and other things on the floor that can make you trip. What can I do with my stairs? Do not leave any items on the stairs. Make sure that there are handrails on both sides of the stairs and use them. Fix handrails that are broken or loose. Make sure that handrails are as long as the stairways. Check any carpeting to make sure that it is firmly attached to the stairs. Fix any carpet that is loose or worn. Avoid having throw rugs at the top or bottom of the stairs. If you do have throw rugs, attach them to the floor with carpet tape. Make sure that you have a light switch at the top of the stairs and the bottom of the stairs. If you do not have them, ask someone to add them for you. What else can I do to help prevent falls? Wear shoes that: Do not have high heels. Have rubber bottoms. Are comfortable and fit you  well. Are closed at the toe. Do not wear sandals. If you use a stepladder: Make sure that it is fully opened. Do not climb a closed stepladder. Make sure that both sides of the stepladder are locked into place. Ask someone to hold it for you, if possible. Clearly mark and make sure that you can see: Any grab bars or handrails. First and last steps. Where the edge of each step is. Use tools that help you move around (mobility aids) if they are needed. These include: Canes. Walkers. Scooters. Crutches. Turn on the lights when you go into a dark area. Replace any light bulbs as soon as they burn out. Set up your furniture so you have a clear path. Avoid moving your furniture around. If any of your floors are uneven, fix them. If there are any pets around you, be aware of where they are. Review your medicines with your doctor. Some medicines can make you feel dizzy. This can increase your chance of falling. Ask your doctor what other things that you can do to help prevent falls. This information is not intended to replace advice given to you by your health care provider. Make sure you discuss any questions you have with your health care provider. Document Released: 08/23/2009 Document Revised: 04/03/2016 Document Reviewed: 12/01/2014 Elsevier Interactive Patient Education  2017 [employer].

## 2023-05-28 ENCOUNTER — Ambulatory Visit: Payer: Medicare Other | Admitting: Dermatology

## 2023-06-29 ENCOUNTER — Ambulatory Visit: Payer: Medicare Other | Admitting: Dermatology

## 2023-06-29 ENCOUNTER — Encounter: Payer: Self-pay | Admitting: Dermatology

## 2023-06-29 VITALS — BP 103/58 | HR 67

## 2023-06-29 DIAGNOSIS — L578 Other skin changes due to chronic exposure to nonionizing radiation: Secondary | ICD-10-CM

## 2023-06-29 DIAGNOSIS — W908XXA Exposure to other nonionizing radiation, initial encounter: Secondary | ICD-10-CM | POA: Diagnosis not present

## 2023-06-29 DIAGNOSIS — Z1283 Encounter for screening for malignant neoplasm of skin: Secondary | ICD-10-CM

## 2023-06-29 DIAGNOSIS — Z7189 Other specified counseling: Secondary | ICD-10-CM

## 2023-06-29 DIAGNOSIS — D229 Melanocytic nevi, unspecified: Secondary | ICD-10-CM

## 2023-06-29 DIAGNOSIS — L821 Other seborrheic keratosis: Secondary | ICD-10-CM

## 2023-06-29 DIAGNOSIS — Z79899 Other long term (current) drug therapy: Secondary | ICD-10-CM

## 2023-06-29 DIAGNOSIS — Z85828 Personal history of other malignant neoplasm of skin: Secondary | ICD-10-CM

## 2023-06-29 DIAGNOSIS — Z86018 Personal history of other benign neoplasm: Secondary | ICD-10-CM

## 2023-06-29 DIAGNOSIS — L814 Other melanin hyperpigmentation: Secondary | ICD-10-CM | POA: Diagnosis not present

## 2023-06-29 DIAGNOSIS — D1801 Hemangioma of skin and subcutaneous tissue: Secondary | ICD-10-CM

## 2023-06-29 DIAGNOSIS — L298 Other pruritus: Secondary | ICD-10-CM

## 2023-06-29 DIAGNOSIS — L650 Telogen effluvium: Secondary | ICD-10-CM

## 2023-06-29 MED ORDER — MINOXIDIL 2.5 MG PO TABS: ORAL_TABLET | ORAL | 2 refills | Status: DC

## 2023-06-29 NOTE — Patient Instructions (Addendum)
Start Minoxidil 2.5 mg 1/2 tablet once daily  Doses of minoxidil for hair loss are considered 'low dose'. This is because the doses used for hair loss are much lower than the doses which are used for conditions such as high blood pressure (hypertension). The doses used for hypertension are 10-40mg  per day.  Side effects are uncommon at the low doses (up to 2.5 mg/day) used to treat hair loss. Potential side effects, more commonly seen at higher doses, include: Increase in hair growth (hypertrichosis) elsewhere on face and body Temporary hair shedding upon starting medication which may last up to 4 weeks Ankle swelling, fluid retention, rapid weight gain more than 5 pounds Low blood pressure and feeling lightheaded or dizzy when standing up quickly Fast or irregular heartbeat Headaches   Will prescribe [facility] compounded prescription anti-itch cream with Amitriptyline 5% / Lidocaine 5% / Pramoxine 1% or Amitriptyline 5% / Gabapentin 10% / Lidocaine 5% Cream.  The patient was advised this is not covered by insurance since it is made by a Set designer. They will receive an email to check out and the medication will be mailed to their home.    Instructions for [facility] Medications  One or more of your medications was sent to the [facility] mail order compounding pharmacy. You will receive an email from them and can purchase the medicine through that link. It will then be mailed to your home at the address you confirmed. If for any reason you do not receive an email from them, please check your spam folder. If you still do not find the email, please let us know. [facility] phone number is [phone].   Recommend daily broad spectrum sunscreen SPF 30+ to sun-exposed areas, reapply every 2 hours as needed. Call for new or changing lesions.  Staying in the shade or wearing long sleeves, sun glasses (UVA+UVB protection) and wide brim hats (4-inch brim around the entire  circumference of the hat) are also recommended for sun protection.    Melanoma ABCDEs  Melanoma is the most dangerous type of skin cancer, and is the leading cause of death from skin disease.  You are more likely to develop melanoma if you: Have light-colored skin, light-colored eyes, or red or blond hair Spend a lot of time in the sun Tan regularly, either outdoors or in a tanning bed Have had blistering sunburns, especially during childhood Have a close family member who has had a melanoma Have atypical moles or large birthmarks  Early detection of melanoma is key since treatment is typically straightforward and cure rates are extremely high if we catch it early.   The first sign of melanoma is often a change in a mole or a new dark spot.  The ABCDE system is a way of remembering the signs of melanoma.  A for asymmetry:  The two halves do not match. B for border:  The edges of the growth are irregular. C for color:  A mixture of colors are present instead of an even brown color. D for diameter:  Melanomas are usually (but not always) greater than 6mm - the size of a pencil eraser. E for evolution:  The spot keeps changing in size, shape, and color.  Please check your skin once per month between visits. You can use a small mirror in front and a large mirror behind you to keep an eye on the back side or your body.   If you see any new or changing lesions before  your next follow-up, please call to schedule a visit.  Please continue daily skin protection including broad spectrum sunscreen SPF 30+ to sun-exposed areas, reapplying every 2 hours as needed when you're outdoors.   Staying in the shade or wearing long sleeves, sun glasses (UVA+UVB protection) and wide brim hats (4-inch brim around the entire circumference of the hat) are also recommended for sun protection.    Due to recent changes in healthcare laws, you may see results of your pathology and/or laboratory studies on MyChart  before the doctors have had a chance to review them. We understand that in some cases there may be results that are confusing or concerning to you. Please understand that not all results are received at the same time and often the doctors may need to interpret multiple results in order to provide you with the best plan of care or course of treatment. Therefore, we ask that you please give [place] 2 business days to thoroughly review all your results before contacting the office for clarification. Should we see a critical lab result, you will be contacted sooner.   If You Need Anything After Your Visit  If you have any questions or concerns for your doctor, please call our main line at [phone] and press option 4 to reach your doctor's medical assistant. If no one answers, please leave a voicemail as directed and we will return your call as soon as possible. Messages left after 4 pm will be answered the following business day.   You may also send [place] a message via MyChart. We typically respond to MyChart messages within 1-2 business days.  For prescription refills, please ask your pharmacy to contact our office. Our fax number is [phone].  If you have an urgent issue when the clinic is closed that cannot wait until the next business day, you can page your doctor at the number below.    Please note that while we do our best to be available for urgent issues outside of office hours, we are not available 24/7.   If you have an urgent issue and are unable to reach [place], you may choose to seek medical care at your doctor's office, retail clinic, urgent care center, or emergency room.  If you have a medical emergency, please immediately call 911 or go to the emergency department.  Pager Numbers  - Dr. Gwen Pounds: [phone]  - Dr. Roseanne Reno: [phone]  - Dr. Katrinka Blazing: [phone]   In the event of inclement weather, please call our main line at [phone] for an update on the status of any  delays or closures.  Dermatology Medication Tips: Please keep the boxes that topical medications come in in order to help keep track of the instructions about where and how to use these. Pharmacies typically print the medication instructions only on the boxes and not directly on the medication tubes.   If your medication is too expensive, please contact our office at [phone] option 4 or send [place] a message through MyChart.   We are unable to tell what your co-pay for medications will be in advance as this is different depending on your insurance coverage. However, we may be able to find a substitute medication at lower cost or fill out paperwork to get insurance to cover a needed medication.   If a prior authorization is required to get your medication covered by your insurance company, please allow [place] 1-2 business days to complete this process.  Drug prices often vary depending on where the  prescription is filled and some pharmacies may offer cheaper prices.  The website www.goodrx.com contains coupons for medications through different pharmacies. The prices here do not account for what the cost may be with help from insurance (it may be cheaper with your insurance), but the website can give you the price if you did not use any insurance.  - You can print the associated coupon and take it with your prescription to the pharmacy.  - You may also stop by our office during regular business hours and pick up a GoodRx coupon card.  - If you need your prescription sent electronically to a different pharmacy, notify our office through [facility] or by phone at [phone] option 4.     Si Usted Necesita Algo Despus de Su Visita  Tambin puede enviarnos un mensaje a travs de Clinical cytogeneticist. Por lo general respondemos a los mensajes de MyChart en el transcurso de 1 a 2 das hbiles.  Para renovar recetas, por favor pida a su farmacia que se ponga en contacto con nuestra oficina. Annie Sable de fax es [place] [phone].  Si tiene un asunto urgente cuando la clnica est cerrada y que no puede esperar hasta el siguiente da hbil, puede llamar/localizar a su doctor(a) al nmero que aparece a continuacin.   Por favor, tenga en cuenta que aunque hacemos todo lo posible para estar disponibles para asuntos urgentes fuera del horario de [place], no estamos disponibles las 24 horas del da, los 7 [address]  [address] de la [place].   Si tiene un problema urgente y no puede comunicarse con nosotros, puede optar por buscar atencin mdica  en el consultorio de su doctor(a), en una clnica privada, en un centro de atencin urgente o en una sala de emergencias.  Si tiene Engineer, drilling, por favor llame inmediatamente al 911 o vaya a la sala de emergencias.  Nmeros de bper  - Dr. Gwen Pounds: [phone]  - Dra. Roseanne Reno: [phone]  - Dr. Katrinka Blazing: [phone]   En caso de inclemencias del tiempo, por favor llame a Lacy Duverney principal al [phone] para una actualizacin sobre el [place] de cualquier retraso o cierre.  Consejos para la medicacin en dermatologa: Por favor, guarde las cajas en las que vienen los medicamentos de uso tpico para ayudarle a seguir las instrucciones sobre dnde y cmo usarlos. Las farmacias generalmente imprimen las instrucciones del medicamento slo en las cajas y no directamente en los tubos del [place].   Si su medicamento es muy caro, por favor, pngase en contacto con Rolm Gala llamando al [phone] y presione la opcin 4 o envenos un mensaje a travs de Clinical cytogeneticist.   No podemos decirle cul ser su copago por los medicamentos por adelantado ya que esto es diferente dependiendo de la cobertura de su seguro. Sin embargo, es posible que podamos encontrar un medicamento sustituto a Audiological scientist un formulario para que el seguro cubra el medicamento que se considera necesario.   Si se requiere una autorizacin previa para que su compaa  de seguros [place] su medicamento, por favor permtanos de 1 a 2 das hbiles para completar [address]  Los precios de los medicamentos varan con frecuencia dependiendo del Environmental consultant de dnde se surte la receta y alguna farmacias pueden ofrecer precios ms baratos.  El sitio web www.goodrx.com tiene cupones para medicamentos de Health and safety inspector. Los precios aqu no tienen en cuenta lo que podra costar con la ayuda del seguro (puede ser ms barato con su seguro), pero el sitio  web puede darle el precio si no Visual merchandiser.  - Puede imprimir el cupn correspondiente y llevarlo con su receta a la farmacia.  - Tambin puede pasar por nuestra oficina durante el horario de atencin regular y Education officer, museum una tarjeta de cupones de GoodRx.  - Si necesita que su receta se enve electrnicamente a una farmacia diferente, informe a nuestra oficina a travs de MyChart de [place] o por telfono llamando al [phone] y presione la opcin 4.

## 2023-06-29 NOTE — Progress Notes (Signed)
Follow-Up Visit   Subjective  Ann Osborne is a 72 y.o. female who presents for the following: Skin Cancer Screening and Full Body Skin Exam. HxBCC, HxDN.   Check spot on right neck. Thought was bug bite. Dur: 2 months. Raises up then flattens out.   The patient presents for Total-Body Skin Exam (TBSE) for skin cancer screening and mole check. The patient has spots, moles and lesions to be evaluated, some may be new or changing and the patient may have concern these could be cancer.    The following portions of the chart were reviewed this encounter and updated as appropriate: medications, allergies, medical history  Review of Systems:  No other skin or systemic complaints except as noted in HPI or Assessment and Plan.  Objective  Well appearing patient in no apparent distress; mood and affect are within normal limits.  A full examination was performed including scalp, head, eyes, ears, nose, lips, neck, chest, axillae, abdomen, back, buttocks, bilateral upper extremities, bilateral lower extremities, hands, feet, fingers, toes, fingernails, and toenails. All findings within normal limits unless otherwise noted below.   Relevant physical exam findings are noted in the Assessment and Plan.           Assessment & Plan   HISTORY OF BASAL CELL CARCINOMA OF THE SKIN. Left upper back medial mid scapula. 08/05/2007. - No evidence of recurrence today - Recommend regular full body skin exams - Recommend daily broad spectrum sunscreen SPF 30+ to sun-exposed areas, reapply every 2 hours as needed.  - Call if any new or changing lesions are noted between office visits  HISTORY OF DYSPLASTIC NEVUS. Left infrascapular, moderate atypia. 03/27/2016. No evidence of recurrence today Recommend regular full body skin exams Recommend daily broad spectrum sunscreen SPF 30+ to sun-exposed areas, reapply every 2 hours as needed.  Call if any new or changing lesions are noted between office  visits   SKIN CANCER SCREENING PERFORMED TODAY.  ACTINIC DAMAGE - Chronic condition, secondary to cumulative UV/sun exposure - diffuse scaly erythematous macules with underlying dyspigmentation - Recommend daily broad spectrum sunscreen SPF 30+ to sun-exposed areas, reapply every 2 hours as needed.  - Staying in the shade or wearing long sleeves, sun glasses (UVA+UVB protection) and wide brim hats (4-inch brim around the entire circumference of the hat) are also recommended for sun protection.  - Call for new or changing lesions.  LENTIGINES, SEBORRHEIC KERATOSES, HEMANGIOMAS - Benign normal skin lesions - Benign-appearing - Call for any changes  MELANOCYTIC NEVI - Tan-brown and/or pink-flesh-colored symmetric macules and papules - Benign appearing on exam today - Observation - Call clinic for new or changing moles - Recommend daily use of broad spectrum spf 30+ sunscreen to sun-exposed areas.    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  Telogen effluvium is a benign, self-limited condition causing increased hair shedding usually for several months. It does not progress to baldness, and the hair eventually grows back on its own. It can be triggered by recent illness, recent surgery, thyroid disease, low iron stores, vitamin D deficiency, fad diets or rapid weight loss, hormonal changes such as pregnancy or birth control pills, and some medication. Usually the hair loss starts 2-3 months after the illness or health change. Rarely, it can continue for longer than a year. Treatments options may include oral or topical Minoxidil; Red Light scalp treatments; Biotin 2.5 mg daily and other options.  Treatment Plan: Start Minoxidil 2.5 mg 1/2 tablet  once daily  Doses of minoxidil for hair loss are considered 'low dose'. This is because the doses used for hair loss are much lower than the doses which are used for conditions such as high blood pressure  (hypertension). The doses used for hypertension are 10-40mg  per day.  Side effects are uncommon at the low doses (up to 2.5 mg/day) used to treat hair loss. Potential side effects, more commonly seen at higher doses, include: Increase in hair growth (hypertrichosis) elsewhere on face and body Temporary hair shedding upon starting medication which may last up to 4 weeks Ankle swelling, fluid retention, rapid weight gain more than 5 pounds Low blood pressure and feeling lightheaded or dizzy when standing up quickly Fast or irregular heartbeat Headaches  Brachioradial Pruritus  Exam: clear today.   Treatment plan:   Will prescribe [facility] compounded prescription anti-itch cream with Amitriptyline 5% / Lidocaine 5% / Pramoxine 1% or Amitriptyline 5% / Gabapentin 10% / Lidocaine 5% Cream.  The patient was advised this is not covered by insurance since it is made by a compounding pharmacy. They will receive an email to check out and the medication will be mailed to their home.     Return in about 6 months (around 12/30/2023) for Telogen Effluvium recheck.  I, Lawson Radar, CMA, am acting as scribe for Armida Sans, MD.   Documentation: I have reviewed the above documentation for accuracy and completeness, and I agree with the above.  Armida Sans, MD

## 2023-07-06 ENCOUNTER — Encounter: Payer: Self-pay | Admitting: Dermatology

## 2023-07-10 ENCOUNTER — Other Ambulatory Visit: Payer: Self-pay

## 2023-07-22 ENCOUNTER — Encounter: Payer: Self-pay | Admitting: Internal Medicine

## 2023-08-03 ENCOUNTER — Inpatient Hospital Stay: Admission: RE | Admit: 2023-08-03 | Payer: Medicare Other | Source: Ambulatory Visit

## 2023-08-06 ENCOUNTER — Ambulatory Visit: Payer: Medicare Other | Admitting: Internal Medicine

## 2023-08-13 ENCOUNTER — Encounter: Payer: Self-pay | Admitting: Internal Medicine

## 2023-08-13 ENCOUNTER — Ambulatory Visit (INDEPENDENT_AMBULATORY_CARE_PROVIDER_SITE_OTHER): Payer: Medicare Other | Admitting: Internal Medicine

## 2023-08-13 VITALS — BP 110/70 | HR 60 | Temp 97.9°F | Resp 16 | Ht 67.0 in | Wt 150.0 lb

## 2023-08-13 DIAGNOSIS — E7800 Pure hypercholesterolemia, unspecified: Secondary | ICD-10-CM

## 2023-08-13 DIAGNOSIS — Z23 Encounter for immunization: Secondary | ICD-10-CM | POA: Diagnosis not present

## 2023-08-13 DIAGNOSIS — G4730 Sleep apnea, unspecified: Secondary | ICD-10-CM

## 2023-08-13 DIAGNOSIS — F320 Major depressive disorder, single episode, mild: Secondary | ICD-10-CM | POA: Diagnosis not present

## 2023-08-13 DIAGNOSIS — Z8639 Personal history of other endocrine, nutritional and metabolic disease: Secondary | ICD-10-CM

## 2023-08-13 DIAGNOSIS — R87619 Unspecified abnormal cytological findings in specimens from cervix uteri: Secondary | ICD-10-CM

## 2023-08-13 DIAGNOSIS — R319 Hematuria, unspecified: Secondary | ICD-10-CM

## 2023-08-13 LAB — BASIC METABOLIC PANEL
BUN: 14 mg/dL (ref 6–23)
CO2: 29 meq/L (ref 19–32)
Calcium: 9.2 mg/dL (ref 8.4–10.5)
Chloride: 103 meq/L (ref 96–112)
Creatinine, Ser: 0.72 mg/dL (ref 0.40–1.20)
GFR: 82.25 mL/min (ref >60.00)
Glucose, Bld: 91 mg/dL (ref 70–99)
Potassium: 4 meq/L (ref 3.5–5.1)
Sodium: 138 meq/L (ref 135–145)

## 2023-08-13 LAB — CBC WITH DIFFERENTIAL/PLATELET
Basophils Absolute: 0 10*3/uL (ref 0.0–0.1)
Basophils Relative: 0.3 % (ref 0.0–3.0)
Eosinophils Absolute: 0.1 10*3/uL (ref 0.0–0.7)
Eosinophils Relative: 1.1 % (ref 0.0–5.0)
HCT: 37.1 % (ref 36.0–46.0)
Hemoglobin: 12.3 g/dL (ref 12.0–15.0)
Lymphocytes Relative: 37.6 % (ref 12.0–46.0)
Lymphs Abs: 2 10*3/uL (ref 0.7–4.0)
MCHC: 33.3 g/dL (ref 30.0–36.0)
MCV: 91.7 fL (ref 78.0–100.0)
Monocytes Absolute: 0.4 10*3/uL (ref 0.1–1.0)
Monocytes Relative: 8 % (ref 3.0–12.0)
Neutro Abs: 2.8 10*3/uL (ref 1.4–7.7)
Neutrophils Relative %: 53 % (ref 43.0–77.0)
Platelets: 229 10*3/uL (ref 150.0–400.0)
RBC: 4.04 Mil/uL (ref 3.87–5.11)
RDW: 13 % (ref 11.5–15.5)
WBC: 5.2 10*3/uL (ref 4.0–10.5)

## 2023-08-13 LAB — HEPATIC FUNCTION PANEL
ALT: 20 U/L (ref 0–35)
AST: 19 U/L (ref 0–37)
Albumin: 4.3 g/dL (ref 3.5–5.2)
Alkaline Phosphatase: 74 U/L (ref 39–117)
Bilirubin, Direct: 0.1 mg/dL (ref 0.0–0.3)
Total Bilirubin: 0.6 mg/dL (ref 0.2–1.2)
Total Protein: 7.4 g/dL (ref 6.0–8.3)

## 2023-08-13 LAB — LIPID PANEL
Cholesterol: 185 mg/dL (ref 0–200)
HDL: 41.5 mg/dL (ref >39.00)
LDL Cholesterol: 122 mg/dL — ABNORMAL HIGH (ref 0–99)
NonHDL: 143.16
Total CHOL/HDL Ratio: 4
Triglycerides: 106 mg/dL (ref 0.0–149.0)
VLDL: 21.2 mg/dL (ref 0.0–40.0)

## 2023-08-13 MED ORDER — VENLAFAXINE HCL ER 75 MG PO CP24: 75.0000 mg | ORAL_CAPSULE | Freq: Every day | ORAL | 1 refills | Status: DC
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
## 2023-08-16 ENCOUNTER — Encounter: Payer: Self-pay | Admitting: Internal Medicine

## 2023-08-16 NOTE — Assessment & Plan Note (Signed)
Saw Dr Dalbert Garnet 01/2022.  Recommended f/u one year.  Need to confirm f/u.  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

## 2023-08-16 NOTE — Assessment & Plan Note (Signed)
Continue effexor.  Doing better.  Follow.

## 2023-08-16 NOTE — Assessment & Plan Note (Signed)
Worked up by Dr Stoioff.  Per patient - everything checked out fine.  Last urinalysis - no rbc's (trace blood).  Follow.   

## 2023-08-16 NOTE — Assessment & Plan Note (Signed)
The 10-year ASCVD risk score (Arnett DK, et al., 2019) is: 10.9%   Values used to calculate the score:     Age: 72 years     Sex: Female     Is Non-Hispanic African American: No     Diabetic: No     Tobacco smoker: No     Systolic Blood Pressure: 110 mmHg     Is BP treated: No     HDL Cholesterol: 41.5 mg/dL     Total Cholesterol: 185 mg/dL  Have discussed calculated cholesterol risk.  Has wanted to continue diet and exercise.  She has adjusted diet.  Lost weight. Follow lipid panel.

## 2023-08-16 NOTE — Assessment & Plan Note (Signed)
Continue cpap.  

## 2023-08-16 NOTE — Assessment & Plan Note (Signed)
S/p left parathyroidectomy.  Follow calcium.

## 2023-08-18 ENCOUNTER — Ambulatory Visit
Admission: RE | Admit: 2023-08-18 | Discharge: 2023-08-18 | Disposition: A | Discharge status: Home / Self Care | Payer: Medicare Other | Source: Ambulatory Visit | Attending: Internal Medicine | Admitting: Internal Medicine

## 2023-08-18 DIAGNOSIS — R928 Other abnormal and inconclusive findings on diagnostic imaging of breast: Secondary | ICD-10-CM

## 2023-08-24 ENCOUNTER — Telehealth: Payer: Self-pay

## 2023-08-24 NOTE — Telephone Encounter (Signed)
-----   Message from Nile sent at 08/19/2023  4:56 AM EDT ----- Please call and notify - I reviewed her mammogram and ultrasound report and radiology feels the changes are c/w a likely benign lesion.  They recommended a bilateral diagnostic mammogram and right breast ultrasound in one year.  Let us know if any problems or concerns.

## 2023-08-24 NOTE — Telephone Encounter (Signed)
LVM for pt to call back.

## 2023-08-25 NOTE — Telephone Encounter (Signed)
Noted  

## 2023-08-25 NOTE — Telephone Encounter (Signed)
Pt called back, I read the note and she verbalized understanding

## 2023-12-15 ENCOUNTER — Ambulatory Visit: Payer: Medicare Other | Admitting: Internal Medicine

## 2023-12-15 ENCOUNTER — Encounter: Payer: Self-pay | Admitting: Internal Medicine

## 2023-12-15 VITALS — BP 118/70 | HR 79 | Temp 98.2°F | Resp 16 | Ht 67.0 in | Wt 146.2 lb

## 2023-12-15 DIAGNOSIS — F320 Major depressive disorder, single episode, mild: Secondary | ICD-10-CM

## 2023-12-15 DIAGNOSIS — G4730 Sleep apnea, unspecified: Secondary | ICD-10-CM

## 2023-12-15 DIAGNOSIS — Z0000 Encounter for general adult medical examination without abnormal findings: Secondary | ICD-10-CM

## 2023-12-15 DIAGNOSIS — E7800 Pure hypercholesterolemia, unspecified: Secondary | ICD-10-CM

## 2023-12-15 DIAGNOSIS — E559 Vitamin D deficiency, unspecified: Secondary | ICD-10-CM

## 2023-12-15 DIAGNOSIS — Z8639 Personal history of other endocrine, nutritional and metabolic disease: Secondary | ICD-10-CM

## 2023-12-15 DIAGNOSIS — R87619 Unspecified abnormal cytological findings in specimens from cervix uteri: Secondary | ICD-10-CM

## 2023-12-15 LAB — HEPATIC FUNCTION PANEL
ALT: 24 U/L (ref 0–35)
AST: 23 U/L (ref 0–37)
Albumin: 4.5 g/dL (ref 3.5–5.2)
Alkaline Phosphatase: 62 U/L (ref 39–117)
Bilirubin, Direct: 0.2 mg/dL (ref 0.0–0.3)
Total Bilirubin: 0.7 mg/dL (ref 0.2–1.2)
Total Protein: 7.5 g/dL (ref 6.0–8.3)

## 2023-12-15 LAB — LIPID PANEL
Cholesterol: 197 mg/dL (ref 0–200)
HDL: 44.2 mg/dL (ref >39.00)
LDL Cholesterol: 135 mg/dL — ABNORMAL HIGH (ref 0–99)
NonHDL: 152.44
Total CHOL/HDL Ratio: 4
Triglycerides: 88 mg/dL (ref 0.0–149.0)
VLDL: 17.6 mg/dL (ref 0.0–40.0)

## 2023-12-15 LAB — BASIC METABOLIC PANEL
BUN: 16 mg/dL (ref 6–23)
CO2: 27 meq/L (ref 19–32)
Calcium: 9.1 mg/dL (ref 8.4–10.5)
Chloride: 102 meq/L (ref 96–112)
Creatinine, Ser: 0.79 mg/dL (ref 0.40–1.20)
GFR: 73.41 mL/min (ref >60.00)
Glucose, Bld: 92 mg/dL (ref 70–99)
Potassium: 4 meq/L (ref 3.5–5.1)
Sodium: 137 meq/L (ref 135–145)

## 2023-12-15 LAB — TSH: TSH: 1.95 u[IU]/mL (ref 0.35–5.50)

## 2023-12-15 LAB — VITAMIN D 25 HYDROXY (VIT D DEFICIENCY, FRACTURES): VITD: 50.69 ng/mL (ref 30.00–100.00)

## 2023-12-15 NOTE — Assessment & Plan Note (Signed)
S/p left parathyroidectomy.  Follow calcium.

## 2023-12-15 NOTE — Assessment & Plan Note (Signed)
 Continue cpap.

## 2023-12-15 NOTE — Progress Notes (Signed)
 Subjective:    [name], female    DOB: [birth year], 73 y.o.   MRN: [id number]   HPI With past history of hypercholesterolemia, increased stress and OSA, she comes in today to follow up on these issues as well as for a complete physical exam. 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 Last evaluated 02/2022. Recommended follow up in one year. She reports she was cleared. Will confirm. Stays active. No chest pain or sob reported. No abdominal pain or bowel change reported. Previous ear itching has resolved. Reports left ear pain now.  Appears the pain is more localized to TMJ region. Also reports worsening by opening her mouth wide, etc. Saw her dentist. No tooth or gum issues. Does grind her teeth. Discussed using a mouth guard. Increased stress. Discussed. Overall handling things well.    Past Medical History:  Diagnosis Date   ADD (attention deficit disorder)    Allergy    Basal cell carcinoma 08/05/2007   left upper back medial mid scapula    Depression    Dysplastic nevus 03/27/2016   left infrascapular, moderate atypia    GERD (gastroesophageal reflux disease)    Hyperparathyroidism (HCC)    Lyme disease 6-7 YEARS   history   Past Surgical History:  Procedure Laterality Date   BREAST BIOPSY Left 20 yrs ago   EYE SURGERY Bilateral 2007 OR 2008   LENS REPLACEMENT    THYROIDECTOMY Left 11/19/2017   Procedure: LEFT INFERIOR THYROIDECTOMY;  Surgeon: Eletha Boas, MD;  Location: [facility];  Service: General;  Laterality: Left;   TUBAL LIGATION  1978   bilateral   WISDOM TOOTH EXTRACTION  1980   Family History  Problem Relation Age of Onset   Uterine cancer Mother    Heart disease Maternal Grandmother    Alcohol abuse Maternal Grandfather    Parkinson's disease Paternal Grandmother    Depression Paternal Grandfather    Breast cancer Neg Hx    Social History   Socioeconomic History   Marital status: Married    Spouse name: Not on file   Number  of children: 2   Years of education: Not on file   Highest education level: Bachelor's degree (e.g., BA, AB, BS)  Occupational History   Not on file  Tobacco Use   Smoking status: Never   Smokeless tobacco: Never  Vaping Use   Vaping status: Never Used  Substance and Sexual Activity   Alcohol use: Yes    Alcohol/week: 0.0 standard drinks of alcohol    Comment: occasional   Drug use: No   Sexual activity: Not Currently  Other Topics Concern   Not on file  Social History Narrative   Not on file   Social Drivers of Health   Financial Resource Strain: Low Risk  (12/11/2023)   Overall Financial Resource Strain (CARDIA)    Difficulty of Paying Living Expenses: Not hard at all  Food Insecurity: No Food Insecurity (12/11/2023)   Hunger Vital Sign    Worried About Running Out of Food in the Last Year: Never true    Ran Out of Food in the Last Year: Never true  Transportation Needs: No Transportation Needs (12/11/2023)   PRAPARE - Administrator, Civil Service (Medical): No    Lack of Transportation (Non-Medical): No  Physical Activity: Insufficiently Active (12/11/2023)   Exercise Vital Sign    Days of Exercise per Week: 4 days    Minutes of Exercise per Session: 30  min  Stress: No Stress Concern Present (12/11/2023)   [employer] of Occupational Health - Occupational Stress Questionnaire    Feeling of Stress : Not at all  Social Connections: Socially Integrated (12/11/2023)   Social Connection and Isolation Panel [NHANES]    Frequency of Communication with Friends and Family: More than three times a week    Frequency of Social Gatherings with Friends and Family: Once a week    Attends Religious Services: More than 4 times per year    Active Member of [employer] or Organizations: Yes    Attends Engineer, Structural: More than 4 times per year    Marital Status: Married     Review of Systems  Constitutional:  Negative for appetite change and unexpected weight  change.  HENT:  Negative for congestion and sinus pressure.   Respiratory:  Negative for cough, chest tightness and shortness of breath.   Cardiovascular:  Negative for chest pain, palpitations and leg swelling.  Gastrointestinal:  Negative for abdominal pain, diarrhea, nausea and vomiting.  Genitourinary:  Negative for difficulty urinating and dysuria.  Musculoskeletal:  Negative for joint swelling and myalgias.  Skin:  Negative for color change and rash.  Neurological:  Negative for dizziness and light-headedness.  Psychiatric/Behavioral:  Negative for agitation and dysphoric mood.        Objective:     BP 118/70   Pulse 79   Temp 98.2 F (36.8 C)   Resp 16   Ht 5' 7 (1.702 m)   Wt 146 lb 3.2 oz (66.3 kg)   LMP 07/16/1995 (LMP Unknown)   SpO2 98%   BMI 22.90 kg/m  Wt Readings from Last 3 Encounters:  12/15/23 146 lb 3.2 oz (66.3 kg)  08/13/23 150 lb (68 kg)  04/20/23 164 lb (74.4 kg)    Physical Exam Vitals reviewed.  Constitutional:      General: She is not in acute distress.    Appearance: Normal appearance. She is well-developed.  HENT:     Head: Normocephalic and atraumatic.     Right Ear: External ear normal.     Left Ear: External ear normal.     Mouth/Throat:     Pharynx: No oropharyngeal exudate or posterior oropharyngeal erythema.  Eyes:     General: No scleral icterus.       Right eye: No discharge.        Left eye: No discharge.     Conjunctiva/sclera: Conjunctivae normal.  Neck:     Thyroid : No thyromegaly.  Cardiovascular:     Rate and Rhythm: Normal rate and regular rhythm.  Pulmonary:     Effort: No tachypnea, accessory muscle usage or respiratory distress.     Breath sounds: Normal breath sounds. No decreased breath sounds or wheezing.  Chest:  Breasts:    Right: No inverted nipple, mass, nipple discharge or tenderness (no axillary adenopathy).     Left: No inverted nipple, mass, nipple discharge or tenderness (no axilarry adenopathy).   Abdominal:     General: Bowel sounds are normal.     Palpations: Abdomen is soft.     Tenderness: There is no abdominal tenderness.  Musculoskeletal:        General: No swelling or tenderness.     Cervical back: Neck supple.  Lymphadenopathy:     Cervical: No cervical adenopathy.  Skin:    Findings: No erythema or rash.  Neurological:     Mental Status: She is alert and oriented to person, place, and  time.  Psychiatric:        Mood and Affect: Mood normal.        Behavior: Behavior normal.         Outpatient Encounter Medications as of 12/15/2023  Medication Sig   Alpha-Lipoic Acid 50 MG TABS Take by mouth daily.   Multiple Vitamin (MULTIVITAMIN) capsule Take 1 capsule by mouth daily.   Multiple Vitamins-Minerals (ICAPS AREDS 2 PO) Take by mouth 2 (two) times daily.   Ruxolitinib Phosphate  (OPZELURA ) 1.5 % CREA Apply 1 application topically 2 (two) times daily. Apply to itchy areas on arms QD-BID PRN flares.   venlafaxine  XR (EFFEXOR -XR) 75 MG 24 hr capsule Take 1 capsule (75 mg total) by mouth daily.   No facility-administered encounter medications on file as of 12/15/2023.     Lab Results  Component Value Date   WBC 5.2 08/13/2023   HGB 12.3 08/13/2023   HCT 37.1 08/13/2023   PLT 229.0 08/13/2023   GLUCOSE 91 08/13/2023   CHOL 185 08/13/2023   TRIG 106.0 08/13/2023   HDL 41.50 08/13/2023   LDLDIRECT 151.0 04/03/2020   LDLCALC 122 (H) 08/13/2023   ALT 20 08/13/2023   AST 19 08/13/2023   NA 138 08/13/2023   K 4.0 08/13/2023   CL 103 08/13/2023   CREATININE 0.72 08/13/2023   BUN 14 08/13/2023   CO2 29 08/13/2023   TSH 2.37 09/01/2022    MM 3D DIAGNOSTIC MAMMOGRAM BILATERAL BREAST Result Date: 08/18/2023 CLINICAL DATA:  73 year old female presenting for follow-up of a likely benign right breast mass. EXAM: DIGITAL DIAGNOSTIC BILATERAL MAMMOGRAM WITH TOMOSYNTHESIS AND CAD; ULTRASOUND RIGHT BREAST LIMITED TECHNIQUE: Bilateral digital diagnostic mammography and  breast tomosynthesis was performed. The images were evaluated with computer-aided detection. ; Targeted ultrasound examination of the right breast was performed COMPARISON:  Previous exam(s). ACR Breast Density Category b: There are scattered areas of fibroglandular density. FINDINGS: No suspicious calcifications, masses or areas of distortion are seen in the bilateral breasts. The small mass in the medial aspect of the right breast appears mammographically stable. Ultrasound of the right breast at 2 o'clock, 3 cm from the nipple demonstrates a stable oval hypoechoic mass measuring 4 x 3 x 3 mm, previously measuring 3 x 2 x 3 mm. IMPRESSION: 1.  The likely benign right breast mass at 2 o'clock is stable. 2.  No evidence of malignancy in the bilateral breasts. RECOMMENDATION: Bilateral diagnostic mammogram and right breast ultrasound in 1 year. I have discussed the findings and recommendations with the patient. If applicable, a reminder letter will be sent to the patient regarding the next appointment. BI-RADS CATEGORY  3: Probably benign. Electronically Signed   By: Rosaline Collet M.D.   On: 08/18/2023 11:58   US  LIMITED ULTRASOUND INCLUDING AXILLA RIGHT BREAST Result Date: 08/18/2023 CLINICAL DATA:  73 year old female presenting for follow-up of a likely benign right breast mass. EXAM: DIGITAL DIAGNOSTIC BILATERAL MAMMOGRAM WITH TOMOSYNTHESIS AND CAD; ULTRASOUND RIGHT BREAST LIMITED TECHNIQUE: Bilateral digital diagnostic mammography and breast tomosynthesis was performed. The images were evaluated with computer-aided detection. ; Targeted ultrasound examination of the right breast was performed COMPARISON:  Previous exam(s). ACR Breast Density Category b: There are scattered areas of fibroglandular density. FINDINGS: No suspicious calcifications, masses or areas of distortion are seen in the bilateral breasts. The small mass in the medial aspect of the right breast appears mammographically stable. Ultrasound  of the right breast at 2 o'clock, 3 cm from the nipple demonstrates a stable oval hypoechoic mass  measuring 4 x 3 x 3 mm, previously measuring 3 x 2 x 3 mm. IMPRESSION: 1.  The likely benign right breast mass at 2 o'clock is stable. 2.  No evidence of malignancy in the bilateral breasts. RECOMMENDATION: Bilateral diagnostic mammogram and right breast ultrasound in 1 year. I have discussed the findings and recommendations with the patient. If applicable, a reminder letter will be sent to the patient regarding the next appointment. BI-RADS CATEGORY  3: Probably benign. Electronically Signed   By: Rosaline Collet M.D.   On: 08/18/2023 11:58       Assessment & Plan:  Health care maintenance Assessment & Plan: Physical today 12/15/23.  Mammogram (bilateral diagnostic with ultrasound) 08/18/23 - Birads III.  Recommended f/u diagnostic mammogram and right breast ultrasound in one year.   Colonoscopy 01/2015.  Recommended f/u in 10 years.     Hypercholesterolemia Assessment & Plan: The 10-year ASCVD risk score (Arnett DK, et al., 2019) is: 12.4%   Values used to calculate the score:     Age: 23 years     Sex: Female     Is Non-Hispanic African American: No     Diabetic: No     Tobacco smoker: No     Systolic Blood Pressure: 118 mmHg     Is BP treated: No     HDL Cholesterol: 41.5 mg/dL     Total Cholesterol: 185 mg/dL  Have previously discussed calculated cholesterol risk.  Has wanted to continue diet and exercise.  Continue diet and exercise. Follow lipid panel.   Orders: -     Basic metabolic panel -     Hepatic function panel -     TSH -     Lipid panel  Vitamin D  deficiency Assessment & Plan: Check vitamin D  level.    Orders: -     VITAMIN D  25 Hydroxy (Vit-D Deficiency, Fractures)  Sleep apnea, unspecified type Assessment & Plan: Continue cpap.    History of hyperparathyroidism Assessment & Plan: S/p left parathyroidectomy.  Follow calcium.    Abnormal cervical Papanicolaou  smear, unspecified abnormal pap finding Assessment & Plan: Saw Dr Verdon 01/2022.  Recommended f/u one year.  Need to confirm if f/u needed Had PAP 01/2022 visit - positive HPV.    Depression, major, single episode, mild (HCC) Assessment & Plan: Continue effexor .  Doing better.  Follow.       Allena Hamilton, MD

## 2023-12-15 NOTE — Assessment & Plan Note (Signed)
 Check vitamin D level

## 2023-12-15 NOTE — Assessment & Plan Note (Signed)
 The 10-year ASCVD risk score (Arnett DK, et al., 2019) is: 12.4%   Values used to calculate the score:     Age: 73 years     Sex: Female     Is Non-Hispanic African American: No     Diabetic: No     Tobacco smoker: No     Systolic Blood Pressure: 118 mmHg     Is BP treated: No     HDL Cholesterol: 41.5 mg/dL     Total Cholesterol: 185 mg/dL  Have previously discussed calculated cholesterol risk.  Has wanted to continue diet and exercise.  Continue diet and exercise. Follow lipid panel.

## 2023-12-15 NOTE — Assessment & Plan Note (Signed)
Saw Dr Dalbert Garnet 01/2022.  Recommended f/u one year.  Need to confirm if f/u needed Had PAP 01/2022 visit - positive HPV.

## 2023-12-15 NOTE — Assessment & Plan Note (Signed)
Continue effexor.  Doing better.  Follow.

## 2023-12-15 NOTE — Assessment & Plan Note (Addendum)
Physical today 12/15/23.  Mammogram (bilateral diagnostic with ultrasound) 08/18/23 - Birads III.  Recommended f/u diagnostic mammogram and right breast ultrasound in one year.   Colonoscopy 01/2015.  Recommended f/u in 10 years.

## 2023-12-31 ENCOUNTER — Ambulatory Visit: Payer: Medicare Other | Admitting: Dermatology

## 2024-03-07 ENCOUNTER — Other Ambulatory Visit: Payer: Self-pay | Admitting: Internal Medicine

## 2024-03-07 MED ORDER — VENLAFAXINE HCL ER 75 MG PO CP24: 75.0000 mg | ORAL_CAPSULE | Freq: Every day | ORAL | 0 refills | Status: DC

## 2024-03-07 NOTE — Telephone Encounter (Signed)
 Copied from CRM [id number]. Topic: Clinical - Medication Refill >> Mar 07, 2024 12:49 PM Luane Rumps D wrote: Most Recent Primary Care Visit:  Provider: SCOTT, CHARLENE  Department: [facility]  Visit Type: PHYSICAL  Date: 12/15/2023  Medication: venlafaxine  XR (EFFEXOR -XR) 75 MG 24 hr capsule, patient has about 4 pills left but experiences adverse side-effects without medication and wondering if she could get a couple pills in the meantime if possible.  Has the patient contacted their pharmacy? No (Agent: If no, request that the patient contact the pharmacy for the refill. If patient does not wish to contact the pharmacy document the reason why and proceed with request.) (Agent: If yes, when and what did the pharmacy advise?)  Is this the correct pharmacy for this prescription? Yes If no, delete pharmacy and type the correct one.  This is the patient's preferred pharmacy:   [facility] [address], [place] - [phone] [facility] [place] [facility] [address] [place] [address] Phone: [phone] Fax: [phone]  Has the prescription been filled recently? No  Is the patient out of the medication? No  Has the patient been seen for an appointment in the last year OR does the patient have an upcoming appointment? Yes  Can we respond through MyChart? Yes  Agent: Please be advised that Rx refills may take up to 3 business days. We ask that you follow-up with your pharmacy.

## 2024-03-21 ENCOUNTER — Telehealth: Payer: Self-pay

## 2024-03-21 NOTE — Telephone Encounter (Signed)
 Called patient to discuss. She is going to keep her upcoming appointment with dr Alvia Awkward

## 2024-03-21 NOTE — Telephone Encounter (Signed)
 Copied from CRM [id number]. Topic: Clinical - Medical Advice >> Mar 21, 2024  9:32 AM [name] wrote: Reason for CRM: patient stated she received a message from Dr. Geralyn Knee regarding following up with Dr. Alvia Awkward. Patient stated she has followed up with Dr. Alvia Awkward and the problem has cleared up. She stated if you had any further questions you could give her a call but I let her know I would relay that information.

## 2024-03-29 ENCOUNTER — Telehealth: Payer: Self-pay

## 2024-03-29 NOTE — Telephone Encounter (Signed)
 Copied from CRM [id number]. Topic: Medical Record Request - Records Request >> Mar 29, 2024  9:27 AM Essie A wrote: Reason for CRM: Patient is requesting medical records for the last physical 12/15/23.  Please return her call at [phone].

## 2024-03-29 NOTE — Telephone Encounter (Signed)
 Called Patient to see if she just needed the notes from her physical on 12/15/23.

## 2024-03-30 NOTE — Telephone Encounter (Signed)
 Left another message to see if she needs the progress note from her physical.

## 2024-03-31 ENCOUNTER — Encounter: Payer: Self-pay | Admitting: Internal Medicine

## 2024-03-31 ENCOUNTER — Telehealth: Payer: Self-pay

## 2024-03-31 NOTE — Telephone Encounter (Signed)
 Noted

## 2024-03-31 NOTE — Telephone Encounter (Signed)
 I left a voicemail for patient letting her know that her appointment with Dr. Dellar Fenton on 04/14/2024 at 3:30pm will need to be rescheduled, as Dr. Geralyn Knee will be out of the office at that time.  I let patient know that we have an available appointment on 04/13/2024 at 11am, and I will go ahead and reschedule her appointment for that date and time.  I asked patient to please call and let us  know whether or not this will work with her schedule.  I let her know that if it does not, then we will be glad to find another date/time that will work for her.  I also sent a letter to patient via MyChart.  E2C2 - when patient calls back, if she needs to reschedule, please send call to our office.

## 2024-04-13 ENCOUNTER — Ambulatory Visit: Admitting: Internal Medicine

## 2024-04-14 ENCOUNTER — Ambulatory Visit: Payer: Medicare Other | Admitting: Internal Medicine

## 2024-04-19 NOTE — Telephone Encounter (Signed)
 Pt confirmed that this has been taken care of. Nothing further needed.

## 2024-04-20 ENCOUNTER — Ambulatory Visit (INDEPENDENT_AMBULATORY_CARE_PROVIDER_SITE_OTHER): Payer: Medicare Other | Admitting: *Deleted

## 2024-04-20 VITALS — Ht 67.0 in | Wt 150.0 lb

## 2024-04-20 DIAGNOSIS — Z0000 Encounter for general adult medical examination without abnormal findings: Secondary | ICD-10-CM | POA: Diagnosis not present

## 2024-04-20 NOTE — Progress Notes (Signed)
 Subjective:   Ann Osborne is a 73 y.o. who presents for a Medicare Wellness preventive visit.  As a reminder, Annual Wellness Visits don't include a physical exam, and some assessments may be limited, especially if this visit is performed virtually. We may recommend an in-person follow-up visit with your provider if needed.  Visit Complete: Virtual I connected with  Ann Osborne on 04/20/24 by a audio enabled telemedicine application and verified that I am speaking with the correct person using two identifiers.  Patient Location: Home  Provider Location: Home Office  I discussed the limitations of evaluation and management by telemedicine. The patient expressed understanding and agreed to proceed.  Vital Signs: Because this visit was a virtual/telehealth visit, some criteria may be missing or patient reported. Any vitals not documented were not able to be obtained and vitals that have been documented are patient reported.  VideoDeclined- This patient declined Librarian, academic. Therefore the visit was completed with audio only.  Persons Participating in Visit: Patient.  AWV Questionnaire: No: Patient Medicare AWV questionnaire was not completed prior to this visit.  Cardiac Risk Factors include: advanced age (>70men, >59 women);dyslipidemia     Objective:     Today's Vitals   04/20/24 0906  Weight: 150 lb (68 kg)  Height: 5' 7 (1.702 m)   Body mass index is 23.49 kg/m.     04/20/2024    9:19 AM 04/20/2023   11:06 AM 04/08/2022    1:08 PM 04/05/2021   10:06 AM 04/03/2020    9:40 AM 04/01/2019   11:38 AM 11/19/2017    6:55 AM  Advanced Directives  Does Patient Have a Medical Advance Directive? Yes Yes Yes Yes Yes Yes Yes  Type of Estate agent of [place];Living will Healthcare Power of [place];Living will Healthcare Power of [place];Living will Healthcare Power of [place];Living will Healthcare Power of  [place];Living will Healthcare Power of [place];Living will Living will  Does patient want to make changes to medical advance directive? No - Patient declined No - Patient declined No - Patient declined No - Patient declined No - Patient declined No - Patient declined   Copy of Healthcare Power of Attorney in Chart? Yes - validated most recent copy scanned in chart (See row information) Yes - validated most recent copy scanned in chart (See row information) No - copy requested Yes - validated most recent copy scanned in chart (See row information) Yes - validated most recent copy scanned in chart (See row information) Yes - validated most recent copy scanned in chart (See row information) No - copy requested    Current Medications (verified) Outpatient Encounter Medications as of 04/20/2024  Medication Sig   Alpha-Lipoic Acid 50 MG TABS Take by mouth daily.   Multiple Vitamin (MULTIVITAMIN) capsule Take 1 capsule by mouth daily.   Multiple Vitamins-Minerals (ICAPS AREDS 2 PO) Take by mouth 2 (two) times daily.   Ruxolitinib Phosphate  (OPZELURA ) 1.5 % CREA Apply 1 application topically 2 (two) times daily. Apply to itchy areas on arms QD-BID PRN flares.   venlafaxine  XR (EFFEXOR -XR) 75 MG 24 hr capsule Take 1 capsule (75 mg total) by mouth daily.   No facility-administered encounter medications on file as of 04/20/2024.    Allergies (verified) Sulfa antibiotics, Compazine [prochlorperazine edisylate], and Prochlorperazine   History: Past Medical History:  Diagnosis Date   ADD (attention deficit disorder)    Allergy    Basal cell carcinoma 08/05/2007   left upper back  medial mid scapula    Depression    Dysplastic nevus 03/27/2016   left infrascapular, moderate atypia    GERD (gastroesophageal reflux disease)    Hyperparathyroidism (HCC)    Lyme disease 6-7 YEARS   history   Past Surgical History:  Procedure Laterality Date   BREAST BIOPSY Left 20 yrs ago   EYE SURGERY Bilateral  2007 OR 2008   LENS REPLACEMENT    THYROIDECTOMY Left 11/19/2017   Procedure: LEFT INFERIOR THYROIDECTOMY;  Surgeon: Oralee Billow, MD;  Location: [facility];  Service: General;  Laterality: Left;   TUBAL LIGATION  1978   bilateral   WISDOM TOOTH EXTRACTION  1980   Family History  Problem Relation Age of Onset   Uterine cancer Mother    Heart disease Maternal Grandmother    Alcohol abuse Maternal Grandfather    Parkinson's disease Paternal Grandmother    Depression Paternal Grandfather    Breast cancer Neg Hx    Social History   Socioeconomic History   Marital status: Married    Spouse name: Not on file   Number of children: 2   Years of education: Not on file   Highest education level: Bachelor's degree (e.g., BA, AB, BS)  Occupational History   Not on file  Tobacco Use   Smoking status: Never   Smokeless tobacco: Never  Vaping Use   Vaping status: Never Used  Substance and Sexual Activity   Alcohol use: Yes    Alcohol/week: 0.0 standard drinks of alcohol    Comment: occasional   Drug use: No   Sexual activity: Not Currently  Other Topics Concern   Not on file  Social History Narrative   Married   Social Drivers of Health   Financial Resource Strain: Low Risk  (04/20/2024)   Overall Financial Resource Strain (CARDIA)    Difficulty of Paying Living Expenses: Not hard at all  Food Insecurity: No Food Insecurity (04/20/2024)   Hunger Vital Sign    Worried About Running Out of Food in the Last Year: Never true    Ran Out of Food in the Last Year: Never true  Transportation Needs: No Transportation Needs (04/20/2024)   PRAPARE - Administrator, Civil Service (Medical): No    Lack of Transportation (Non-Medical): No  Physical Activity: Insufficiently Active (04/20/2024)   Exercise Vital Sign    Days of Exercise per Week: 3 days    Minutes of Exercise per Session: 30 min  Stress: No Stress Concern Present (04/20/2024)   [employer] of Occupational Health -  Occupational Stress Questionnaire    Feeling of Stress : Not at all  Social Connections: Moderately Integrated (04/20/2024)   Social Connection and Isolation Panel [NHANES]    Frequency of Communication with Friends and Family: More than three times a week    Frequency of Social Gatherings with Friends and Family: Twice a week    Attends Religious Services: More than 4 times per year    Active Member of [employer] or Organizations: No    Attends Engineer, structural: Never    Marital Status: Married    Tobacco Counseling Counseling given: Not Answered    Clinical Intake:  Pre-visit preparation completed: Yes  Pain : No/denies pain     BMI - recorded: 23.49 Nutritional Status: BMI of 19-24  Normal Nutritional Risks: None Diabetes: No  No results found for: HGBA1C   How often do you need to have someone help you when you read instructions,  pamphlets, or other written materials from your doctor or pharmacy?: 1 - Never  Interpreter Needed?: No  Information entered by :: R. Sheyanne Munley LPN   Activities of Daily Living     04/20/2024    9:07 AM  In your present state of health, do you have any difficulty performing the following activities:  Hearing? 1  Comment wears aids  Vision? 0  Comment glasses  Difficulty concentrating or making decisions? 0  Walking or climbing stairs? 0  Dressing or bathing? 0  Doing errands, shopping? 0  Preparing Food and eating ? N  Using the Toilet? N  In the past six months, have you accidently leaked urine? N  Do you have problems with loss of bowel control? N  Managing your Medications? N  Managing your Finances? N  Housekeeping or managing your Housekeeping? N    Patient Care Team: Dellar Fenton, MD as PCP - General (Internal Medicine) Elta Halter, MD (Dermatology)  I have updated your Care Teams any recent Medical Services you may have received from other providers in the past year.     Assessment:    This is a  routine wellness examination for Fabianna.  Hearing/Vision screen Hearing Screening - Comments:: Wears aids Vision Screening - Comments:: glasses   Goals Addressed             This Visit's Progress    Patient Stated       Wants to continue a good diet and exercise        Depression Screen     04/20/2024    9:13 AM 12/15/2023   12:57 PM 08/13/2023   11:07 AM 04/20/2023   11:04 AM 03/03/2023   10:01 AM 09/01/2022   10:31 AM 04/08/2022    1:07 PM  PHQ 2/9 Scores  PHQ - 2 Score 0 0 0 0 0 0 0  PHQ- 9 Score 0 0 0 0 0  0    Fall Risk     04/20/2024    9:10 AM 12/15/2023   12:57 PM 04/20/2023   11:07 AM 04/19/2023   11:22 PM 09/01/2022   10:30 AM  Fall Risk   Falls in the past year? 0 0 0 0 0  Number falls in past yr: 0 0 0 0 0  Injury with Fall? 0 0 0 0 0  Risk for fall due to : No Fall Risks No Fall Risks No Fall Risks  No Fall Risks  Follow up Falls evaluation completed;Falls prevention discussed Falls evaluation completed Falls prevention discussed;Falls evaluation completed  Falls evaluation completed    MEDICARE RISK AT HOME:  Medicare Risk at Home Any stairs in or around the home?: Yes If so, are there any without handrails?: No Home free of loose throw rugs in walkways, pet beds, electrical cords, etc?: Yes Adequate lighting in your home to reduce risk of falls?: Yes Life alert?: No Use of a cane, walker or w/c?: No Grab bars in the bathroom?: Yes Shower chair or bench in shower?: Yes (has one available) Elevated toilet seat or a handicapped toilet?: No  TIMED UP AND GO:  Was the test performed?  No  Cognitive Function: 6CIT completed    10/29/2017   10:35 AM  MMSE - Mini Mental State Exam  Orientation to time 5  Orientation to Place 5  Registration 3  Attention/ Calculation 5  Recall 3  Language- name 2 objects 2  Language- repeat 1  Language- follow 3 step command 3  Language- read & follow direction 1  Write a sentence 1  Copy design 1  Total score  30        04/20/2024    9:19 AM 04/20/2023   11:11 AM 04/01/2019   12:02 PM 10/28/2016   10:40 AM  6CIT Screen  What Year? 0 points 0 points 0 points 0 points  What month? 0 points 0 points 0 points 0 points  What time? 0 points 0 points 0 points 0 points  Count back from 20 0 points 0 points 0 points 0 points  Months in reverse 0 points 0 points 0 points 0 points  Repeat phrase 0 points 0 points 0 points 0 points  Total Score 0 points 0 points 0 points 0 points    Immunizations Immunization History  Administered Date(s) Administered   Fluad Quad(high Dose 65+) 08/06/2019, 08/07/2020, 07/16/2021, 09/01/2022   Fluad Trivalent(High Dose 65+) 08/13/2023   Influenza, High Dose Seasonal PF 09/02/2018   Influenza, Quadrivalent, Recombinant, Inj, Pf 08/19/2017   Influenza,inj,Quad PF,6+ Mos 08/25/2013, 08/02/2014   Influenza-Unspecified 08/20/2015, 08/19/2016   Moderna SARS-COV2 Booster Vaccination 12/31/2020   Moderna Sars-Covid-2 Vaccination 02/03/2020, 03/07/2020, 12/31/2020   Pneumococcal Conjugate-13 08/19/2013   Pneumococcal Polysaccharide-23 08/19/2012, 10/29/2017   Tdap 07/18/2021   Zoster Recombinant(Shingrix) 08/11/2014, 07/28/2023    Screening Tests Health Maintenance  Topic Date Due   COVID-19 Vaccine (4 - 2024-25 season) 07/12/2023   Medicare Annual Wellness (AWV)  04/19/2024   INFLUENZA VACCINE  06/10/2024   MAMMOGRAM  08/17/2024   Colonoscopy  01/14/2025   DTaP/Tdap/Td (2 - Td or Tdap) 07/19/2031   Pneumonia Vaccine 41+ Years old  Completed   DEXA SCAN  Completed   Hepatitis C Screening  Completed   Zoster Vaccines- Shingrix  Completed   HPV VACCINES  Aged Out   Meningococcal B Vaccine  Aged Out    Health Maintenance  Health Maintenance Due  Topic Date Due   COVID-19 Vaccine (4 - 2024-25 season) 07/12/2023   Medicare Annual Wellness (AWV)  04/19/2024   Health Maintenance Items Addressed: Patient declines covid vaccine  Additional  Screening:  Vision Screening: Recommended annual ophthalmology exams for early detection of glaucoma and other disorders of the eye. Up to date [facility] Would you like a referral to an eye doctor? No    Dental Screening: Recommended annual dental exams for proper oral hygiene  Community Resource Referral / Chronic Care Management: CRR required this visit?  No   CCM required this visit?  No   Plan:    I have personally reviewed and noted the following in the patient's chart:   Medical and social history Use of alcohol, tobacco or illicit drugs  Current medications and supplements including opioid prescriptions. Patient is not currently taking opioid prescriptions. Functional ability and status Nutritional status Physical activity Advanced directives List of other physicians Hospitalizations, surgeries, and ER visits in previous 12 months Vitals Screenings to include cognitive, depression, and falls Referrals and appointments  In addition, I have reviewed and discussed with patient certain preventive protocols, quality metrics, and best practice recommendations. A written personalized care plan for preventive services as well as general preventive health recommendations were provided to patient.   Felicitas Horse, LPN   1/61/0960   After Visit Summary: (MyChart) Due to this being a telephonic visit, the after visit summary with patients personalized plan was offered to patient via MyChart   Notes: Nothing significant to report at this time.

## 2024-04-20 NOTE — Patient Instructions (Signed)
 Ann Osborne , Thank you for taking time out of your busy schedule to complete your Annual Wellness Visit with me. I enjoyed our conversation and look forward to speaking with you again next year. I, as well as your care team,  appreciate your ongoing commitment to your health goals. Please review the following plan we discussed and let me know if I can assist you in the future. Your Game plan/ To Do List    Referrals: If you haven't heard from the office you've been referred to, please reach out to them at the phone provided.  Consider updating covid vaccine Follow up Visits: Next Medicare AWV with our clinical staff: 04/24/25 @ 11:30   Have you seen your provider in the last 6 months (3 months if uncontrolled diabetes)? Yes Next Office Visit with your provider: 06/30/24  Clinician Recommendations:  Aim for 30 minutes of exercise or brisk walking, 6-8 glasses of water, and 5 servings of fruits and vegetables each day.       This is a list of the screening recommended for you and due dates:  Health Maintenance  Topic Date Due   COVID-19 Vaccine (4 - 2024-25 season) 07/12/2023   Flu Shot  06/10/2024   Mammogram  08/17/2024   Colon Cancer Screening  01/14/2025   Medicare Annual Wellness Visit  04/20/2025   DTaP/Tdap/Td vaccine (2 - Td or Tdap) 07/19/2031   Pneumonia Vaccine  Completed   DEXA scan (bone density measurement)  Completed   Hepatitis C Screening  Completed   Zoster (Shingles) Vaccine  Completed   HPV Vaccine  Aged Out   Meningitis B Vaccine  Aged Out    Advanced directives: (In Chart) A copy of your advanced directives are scanned into your chart should your provider ever need it. Advance Care Planning is important because it:  [x]  Makes sure you receive the medical care that is consistent with your values, goals, and preferences  [x]  It provides guidance to your family and loved ones and reduces their decisional burden about whether or not they are making the right  decisions based on your wishes.

## 2024-05-26 ENCOUNTER — Other Ambulatory Visit: Payer: Self-pay | Admitting: Internal Medicine

## 2024-05-26 MED ORDER — VENLAFAXINE HCL ER 75 MG PO CP24: 75.0000 mg | ORAL_CAPSULE | Freq: Every day | ORAL | 1 refills | Status: DC

## 2024-05-26 NOTE — Telephone Encounter (Signed)
 Copied from CRM [id number]. Topic: Clinical - Medication Refill >> May 26, 2024  9:45 AM Berneda FALCON wrote: Medication: venlafaxine  XR (EFFEXOR -XR) 75 MG 24 hr capsule  Has the patient contacted their pharmacy? Yes (Agent: If no, request that the patient contact the pharmacy for the refill. If patient does not wish to contact the pharmacy document the reason why and proceed with request.) (Agent: If yes, when and what did the pharmacy advise?)  This is the patient's preferred pharmacy:  [facility] - [place], [place] - [address] [place] [address] Phone: [phone] Fax: [phone]  Is this the correct pharmacy for this prescription? Yes If no, delete pharmacy and type the correct one.   Has the prescription been filled recently? No  Is the patient out of the medication? No  Has the patient been seen for an appointment in the last year OR does the patient have an upcoming appointment? Yes  Can we respond through MyChart? Yes  Agent: Please be advised that Rx refills may take up to 3 business days. We ask that you follow-up with your pharmacy.

## 2024-05-27 ENCOUNTER — Other Ambulatory Visit: Payer: Self-pay | Admitting: Internal Medicine

## 2024-05-27 MED ORDER — VENLAFAXINE HCL ER 75 MG PO CP24: 75.0000 mg | ORAL_CAPSULE | Freq: Every day | ORAL | 1 refills | Status: DC

## 2024-05-27 NOTE — Telephone Encounter (Signed)
 Copied from CRM [id number]. Topic: Clinical - Medication Refill >> May 27, 2024 10:28 AM Rea C wrote: Medication: venlafaxine  XR (EFFEXOR -XR) 75 MG 24 hr capsule  Has the patient contacted their pharmacy? Patient called and would [facility] [place] because she is currently closer to that location.   This is the patient's preferred pharmacy:  [facility] [address], [place] - [address] [place] [address] Phone: [phone] Fax: [phone]   Is this the correct pharmacy for this prescription? Yes If no, delete pharmacy and type the correct one.   Has the prescription been filled recently? Yes  Is the patient out of the medication? Yes  Has the patient been seen for an appointment in the last year OR does the patient have an upcoming appointment? Yes  Can we respond through MyChart? Yes  Agent: Please be advised that Rx refills may take up to 3 business days. We ask that you follow-up with your pharmacy.

## 2024-05-31 NOTE — Telephone Encounter (Unsigned)
 Copied from CRM [id number]. Topic: Clinical - Prescription Issue >> May 31, 2024  9:29 AM Henretta I wrote: Reason for CRM: Patients medication venlafaxine  XR (EFFEXOR -XR) 75 MG 24 hr capsule [facility] but it needs to go to [facility] - [place], [place] - [address] [place] [address] Phone: [phone] Fax: [phone] Can prescription be switched to this pharmacy.

## 2024-06-30 ENCOUNTER — Ambulatory Visit: Admitting: Internal Medicine

## 2024-07-05 ENCOUNTER — Ambulatory Visit: Admitting: Internal Medicine

## 2024-07-05 VITALS — BP 118/68 | HR 61 | Temp 98.0°F | Resp 16 | Ht 67.0 in | Wt 147.2 lb

## 2024-07-05 DIAGNOSIS — R87619 Unspecified abnormal cytological findings in specimens from cervix uteri: Secondary | ICD-10-CM | POA: Diagnosis not present

## 2024-07-05 DIAGNOSIS — G4730 Sleep apnea, unspecified: Secondary | ICD-10-CM

## 2024-07-05 DIAGNOSIS — E7800 Pure hypercholesterolemia, unspecified: Secondary | ICD-10-CM

## 2024-07-05 DIAGNOSIS — R928 Other abnormal and inconclusive findings on diagnostic imaging of breast: Secondary | ICD-10-CM

## 2024-07-05 DIAGNOSIS — Z8639 Personal history of other endocrine, nutritional and metabolic disease: Secondary | ICD-10-CM

## 2024-07-05 DIAGNOSIS — F320 Major depressive disorder, single episode, mild: Secondary | ICD-10-CM

## 2024-07-05 LAB — HEPATIC FUNCTION PANEL
ALT: 31 U/L (ref 0–35)
AST: 24 U/L (ref 0–37)
Albumin: 4.4 g/dL (ref 3.5–5.2)
Alkaline Phosphatase: 52 U/L (ref 39–117)
Bilirubin, Direct: 0.1 mg/dL (ref 0.0–0.3)
Total Bilirubin: 0.7 mg/dL (ref 0.2–1.2)
Total Protein: 7.3 g/dL (ref 6.0–8.3)

## 2024-07-05 LAB — CBC WITH DIFFERENTIAL/PLATELET
Basophils Absolute: 0 K/uL (ref 0.0–0.1)
Basophils Relative: 0.4 % (ref 0.0–3.0)
Eosinophils Absolute: 0 K/uL (ref 0.0–0.7)
Eosinophils Relative: 0.9 % (ref 0.0–5.0)
HCT: 38.3 % (ref 36.0–46.0)
Hemoglobin: 12.8 g/dL (ref 12.0–15.0)
Lymphocytes Relative: 35 % (ref 12.0–46.0)
Lymphs Abs: 1.7 K/uL (ref 0.7–4.0)
MCHC: 33.5 g/dL (ref 30.0–36.0)
MCV: 91.4 fl (ref 78.0–100.0)
Monocytes Absolute: 0.4 K/uL (ref 0.1–1.0)
Monocytes Relative: 7.6 % (ref 3.0–12.0)
Neutro Abs: 2.8 K/uL (ref 1.4–7.7)
Neutrophils Relative %: 56.1 % (ref 43.0–77.0)
Platelets: 228 K/uL (ref 150.0–400.0)
RBC: 4.19 Mil/uL (ref 3.87–5.11)
RDW: 13.3 % (ref 11.5–15.5)
WBC: 5 K/uL (ref 4.0–10.5)

## 2024-07-05 LAB — BASIC METABOLIC PANEL WITH GFR
BUN: 12 mg/dL (ref 6–23)
CO2: 31 meq/L (ref 19–32)
Calcium: 9 mg/dL (ref 8.4–10.5)
Chloride: 99 meq/L (ref 96–112)
Creatinine, Ser: 0.72 mg/dL (ref 0.40–1.20)
GFR: 81.74 mL/min (ref >60.00)
Glucose, Bld: 92 mg/dL (ref 70–99)
Potassium: 4 meq/L (ref 3.5–5.1)
Sodium: 139 meq/L (ref 135–145)

## 2024-07-05 LAB — LIPID PANEL
Cholesterol: 195 mg/dL (ref 0–200)
HDL: 48 mg/dL (ref >39.00)
LDL Cholesterol: 131 mg/dL — ABNORMAL HIGH (ref 0–99)
NonHDL: 146.94
Total CHOL/HDL Ratio: 4
Triglycerides: 81 mg/dL (ref 0.0–149.0)
VLDL: 16.2 mg/dL (ref 0.0–40.0)

## 2024-07-05 MED ORDER — VENLAFAXINE HCL ER 75 MG PO CP24: 75.0000 mg | ORAL_CAPSULE | Freq: Every day | ORAL | 1 refills | Status: DC

## 2024-07-05 NOTE — Progress Notes (Signed)
 Subjective:    [name], female    DOB: April 12, 1951, 73 y.o.   MRN: [id number]  Patient here for  Chief Complaint  Patient presents with   Medical Management of Chronic Issues    HPI Here for a scheduled follow up - follow up regarding hypercholesterolemia, increased stress and OSA. Had f/u with Dr Verdon - 06/14/24 - annual gyn exam. Repeat pap - positive HPV. 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 Also recommended diagnostic mammogram and right breast ultrasound in one year - due 08/2024. Continues cpap. Increased stress - helping a friend with health issues. Staying with her. Some family stress. Discussed. Going to find her own place to live. Overall she appears to be handling things relatively well. No chest pain or sob reported. No abdominal pain or bowel change reported.    Past Medical History:  Diagnosis Date   ADD (attention deficit disorder)    Allergy    Basal cell carcinoma 08/05/2007   left upper back medial mid scapula    Depression    Dysplastic nevus 03/27/2016   left infrascapular, moderate atypia    GERD (gastroesophageal reflux disease)    Hyperparathyroidism (HCC)    Lyme disease 6-7 YEARS   history   Past Surgical History:  Procedure Laterality Date   BREAST BIOPSY Left 20 yrs ago   EYE SURGERY Bilateral 2007 OR 2008   LENS REPLACEMENT    THYROIDECTOMY Left 11/19/2017   Procedure: LEFT INFERIOR THYROIDECTOMY;  Surgeon: Eletha Boas, MD;  Location: [facility];  Service: General;  Laterality: Left;   TUBAL LIGATION  1978   bilateral   WISDOM TOOTH EXTRACTION  1980   Family History  Problem Relation Age of Onset   Uterine cancer Mother    Heart disease Maternal Grandmother    Alcohol abuse Maternal Grandfather    Parkinson's disease Paternal Grandmother    Depression Paternal Grandfather    Breast cancer Neg Hx    Social History   Socioeconomic History   Marital status: Married    Spouse name: Not on file   Number of  children: 2   Years of education: Not on file   Highest education level: Bachelor's degree (e.g., BA, AB, BS)  Occupational History   Not on file  Tobacco Use   Smoking status: Never   Smokeless tobacco: Never  Vaping Use   Vaping status: Never Used  Substance and Sexual Activity   Alcohol use: Yes    Alcohol/week: 0.0 standard drinks of alcohol    Comment: occasional   Drug use: No   Sexual activity: Not Currently  Other Topics Concern   Not on file  Social History Narrative   Married   Social Drivers of Health   Financial Resource Strain: Low Risk  (07/02/2024)   Overall Financial Resource Strain (CARDIA)    Difficulty of Paying Living Expenses: Not hard at all  Food Insecurity: No Food Insecurity (07/02/2024)   Hunger Vital Sign    Worried About Running Out of Food in the Last Year: Never true    Ran Out of Food in the Last Year: Never true  Transportation Needs: No Transportation Needs (07/02/2024)   PRAPARE - Administrator, Civil Service (Medical): No    Lack of Transportation (Non-Medical): No  Physical Activity: Sufficiently Active (07/02/2024)   Exercise Vital Sign    Days of Exercise per Week: 5 days    Minutes of Exercise per Session: 40 min  Recent Concern: Physical Activity - Insufficiently Active (04/20/2024)   Exercise Vital Sign    Days of Exercise per Week: 3 days    Minutes of Exercise per Session: 30 min  Stress: No Stress Concern Present (07/02/2024)   [employer] of Occupational Health - Occupational Stress Questionnaire    Feeling of Stress: Not at all  Social Connections: Socially Integrated (07/02/2024)   Social Connection and Isolation Panel    Frequency of Communication with Friends and Family: Twice a week    Frequency of Social Gatherings with Friends and Family: Twice a week    Attends Religious Services: More than 4 times per year    Active Member of [employer] or Organizations: Yes    Attends Engineer, structural: More  than 4 times per year    Marital Status: Married     Review of Systems  Constitutional:  Negative for appetite change and unexpected weight change.  HENT:  Negative for congestion and sinus pressure.   Respiratory:  Negative for cough, chest tightness and shortness of breath.   Cardiovascular:  Negative for chest pain, palpitations and leg swelling.  Gastrointestinal:  Negative for abdominal pain, diarrhea, nausea and vomiting.  Genitourinary:  Negative for difficulty urinating and dysuria.  Musculoskeletal:  Negative for joint swelling and myalgias.  Skin:  Negative for color change and rash.  Neurological:  Negative for dizziness and headaches.  Psychiatric/Behavioral:  Negative for agitation and dysphoric mood.        Objective:     BP 118/68   Pulse 61   Temp 98 F (36.7 C)   Resp 16   Ht 5' 7 (1.702 m)   Wt 147 lb 3.2 oz (66.8 kg)   LMP 07/16/1995 (LMP Unknown)   SpO2 98%   BMI 23.05 kg/m  Wt Readings from Last 3 Encounters:  07/05/24 147 lb 3.2 oz (66.8 kg)  04/20/24 150 lb (68 kg)  12/15/23 146 lb 3.2 oz (66.3 kg)    Physical Exam Vitals reviewed.  Constitutional:      General: She is not in acute distress.    Appearance: Normal appearance.  HENT:     Head: Normocephalic and atraumatic.     Right Ear: External ear normal.     Left Ear: External ear normal.     Mouth/Throat:     Pharynx: No oropharyngeal exudate or posterior oropharyngeal erythema.  Eyes:     General: No scleral icterus.       Right eye: No discharge.        Left eye: No discharge.     Conjunctiva/sclera: Conjunctivae normal.  Neck:     Thyroid : No thyromegaly.  Cardiovascular:     Rate and Rhythm: Normal rate and regular rhythm.  Pulmonary:     Effort: No respiratory distress.     Breath sounds: Normal breath sounds. No wheezing.  Abdominal:     General: Bowel sounds are normal.     Palpations: Abdomen is soft.     Tenderness: There is no abdominal tenderness.   Musculoskeletal:        General: No swelling or tenderness.     Cervical back: Neck supple. No tenderness.  Lymphadenopathy:     Cervical: No cervical adenopathy.  Skin:    Findings: No erythema or rash.  Neurological:     Mental Status: She is alert.  Psychiatric:        Mood and Affect: Mood normal.        Behavior:  Behavior normal.         Outpatient Encounter Medications as of 07/05/2024  Medication Sig   Alpha-Lipoic Acid 50 MG TABS Take by mouth daily.   Multiple Vitamin (MULTIVITAMIN) capsule Take 1 capsule by mouth daily.   Multiple Vitamins-Minerals (ICAPS AREDS 2 PO) Take by mouth 2 (two) times daily.   Ruxolitinib Phosphate  (OPZELURA ) 1.5 % CREA Apply 1 application topically 2 (two) times daily. Apply to itchy areas on arms QD-BID PRN flares.   venlafaxine  XR (EFFEXOR -XR) 75 MG 24 hr capsule Take 1 capsule (75 mg total) by mouth daily.   [DISCONTINUED] venlafaxine  XR (EFFEXOR -XR) 75 MG 24 hr capsule Take 1 capsule (75 mg total) by mouth daily.   No facility-administered encounter medications on file as of 07/05/2024.     Lab Results  Component Value Date   WBC 5.0 07/05/2024   HGB 12.8 07/05/2024   HCT 38.3 07/05/2024   PLT 228.0 07/05/2024   GLUCOSE 92 07/05/2024   CHOL 195 07/05/2024   TRIG 81.0 07/05/2024   HDL 48.00 07/05/2024   LDLDIRECT 151.0 04/03/2020   LDLCALC 131 (H) 07/05/2024   ALT 31 07/05/2024   AST 24 07/05/2024   NA 139 07/05/2024   K 4.0 07/05/2024   CL 99 07/05/2024   CREATININE 0.72 07/05/2024   BUN 12 07/05/2024   CO2 31 07/05/2024   TSH 1.95 12/15/2023    MM 3D DIAGNOSTIC MAMMOGRAM BILATERAL BREAST Result Date: 08/18/2023 CLINICAL DATA:  73 year old female presenting for follow-up of a likely benign right breast mass. EXAM: DIGITAL DIAGNOSTIC BILATERAL MAMMOGRAM WITH TOMOSYNTHESIS AND CAD; ULTRASOUND RIGHT BREAST LIMITED TECHNIQUE: Bilateral digital diagnostic mammography and breast tomosynthesis was performed. The images were  evaluated with computer-aided detection. ; Targeted ultrasound examination of the right breast was performed COMPARISON:  Previous exam(s). ACR Breast Density Category b: There are scattered areas of fibroglandular density. FINDINGS: No suspicious calcifications, masses or areas of distortion are seen in the bilateral breasts. The small mass in the medial aspect of the right breast appears mammographically stable. Ultrasound of the right breast at 2 o'clock, 3 cm from the nipple demonstrates a stable oval hypoechoic mass measuring 4 x 3 x 3 mm, previously measuring 3 x 2 x 3 mm. IMPRESSION: 1.  The likely benign right breast mass at 2 o'clock is stable. 2.  No evidence of malignancy in the bilateral breasts. RECOMMENDATION: Bilateral diagnostic mammogram and right breast ultrasound in 1 year. I have discussed the findings and recommendations with the patient. If applicable, a reminder letter will be sent to the patient regarding the next appointment. BI-RADS CATEGORY  3: Probably benign. Electronically Signed   By: Rosaline Collet M.D.   On: 08/18/2023 11:58   US  LIMITED ULTRASOUND INCLUDING AXILLA RIGHT BREAST Result Date: 08/18/2023 CLINICAL DATA:  73 year old female presenting for follow-up of a likely benign right breast mass. EXAM: DIGITAL DIAGNOSTIC BILATERAL MAMMOGRAM WITH TOMOSYNTHESIS AND CAD; ULTRASOUND RIGHT BREAST LIMITED TECHNIQUE: Bilateral digital diagnostic mammography and breast tomosynthesis was performed. The images were evaluated with computer-aided detection. ; Targeted ultrasound examination of the right breast was performed COMPARISON:  Previous exam(s). ACR Breast Density Category b: There are scattered areas of fibroglandular density. FINDINGS: No suspicious calcifications, masses or areas of distortion are seen in the bilateral breasts. The small mass in the medial aspect of the right breast appears mammographically stable. Ultrasound of the right breast at 2 o'clock, 3 cm from the  nipple demonstrates a stable oval hypoechoic mass measuring 4 x  3 x 3 mm, previously measuring 3 x 2 x 3 mm. IMPRESSION: 1.  The likely benign right breast mass at 2 o'clock is stable. 2.  No evidence of malignancy in the bilateral breasts. RECOMMENDATION: Bilateral diagnostic mammogram and right breast ultrasound in 1 year. I have discussed the findings and recommendations with the patient. If applicable, a reminder letter will be sent to the patient regarding the next appointment. BI-RADS CATEGORY  3: Probably benign. Electronically Signed   By: Rosaline Collet M.D.   On: 08/18/2023 11:58       Assessment & Plan:  Hypercholesterolemia Assessment & Plan: The 10-year ASCVD risk score (Arnett DK, et al., 2019) is: 13.8%   Values used to calculate the score:     Age: 65 years     Clincally relevant sex: Female     Is Non-Hispanic African American: No     Diabetic: No     Tobacco smoker: No     Systolic Blood Pressure: 118 mmHg     Is BP treated: No     HDL Cholesterol: 48 mg/dL     Total Cholesterol: 195 mg/dL Discussed calculated cholesterol risk.  Wants to continue diet and exercise.  Continue diet and exercise. Follow lipid panel.   Orders: -     CBC with Differential/Platelet -     Basic metabolic panel with GFR -     Hepatic function panel -     Lipid panel  Sleep apnea, unspecified type Assessment & Plan: Continue cpap.    Abnormal mammogram Assessment & Plan: F/u bilateral (diagnostic) breast mammogram and ultrasound 08/18/23 - [facility].  Recommended - Bilateral diagnostic mammogram and right breast ultrasound in 1 year.  Orders: -     MM 3D DIAGNOSTIC MAMMOGRAM BILATERAL BREAST; Future -     US  BREAST COMPLETE UNI RIGHT INC AXILLA; Future  Abnormal cervical Papanicolaou smear, unspecified abnormal pap finding Assessment & Plan: Dr Verdon - 06/14/24 - annual gyn exam. Repeat pap - positive HPV. 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   Depression, major,  single episode, mild (HCC) Assessment & Plan: Overall handling things well. Continue effexor . Follow. Notify me if feels needs any further intervention. Follow.    History of hyperparathyroidism Assessment & Plan: S/p left parathyroidectomy.  Follow calcium.    Other orders -     Venlafaxine  HCl ER; Take 1 capsule (75 mg total) by mouth daily.  Dispense: 90 capsule; Refill: 1     Allena Hamilton, MD

## 2024-07-06 ENCOUNTER — Ambulatory Visit: Payer: Self-pay | Admitting: Internal Medicine

## 2024-07-11 ENCOUNTER — Encounter: Payer: Self-pay | Admitting: Internal Medicine

## 2024-07-11 NOTE — Assessment & Plan Note (Signed)
 Dr Verdon - 06/14/24 - annual gyn exam. Repeat pap - positive HPV. 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

## 2024-07-11 NOTE — Assessment & Plan Note (Signed)
 F/u bilateral (diagnostic) breast mammogram and ultrasound 08/18/23 - [facility] III.  Recommended - Bilateral diagnostic mammogram and right breast ultrasound in 1 year.

## 2024-07-11 NOTE — Assessment & Plan Note (Signed)
S/p left parathyroidectomy.  Follow calcium.

## 2024-07-11 NOTE — Assessment & Plan Note (Signed)
 The 10-year ASCVD risk score (Arnett DK, et al., 2019) is: 13.8%   Values used to calculate the score:     Age: 73 years     Clincally relevant sex: Female     Is Non-Hispanic African American: No     Diabetic: No     Tobacco smoker: No     Systolic Blood Pressure: 118 mmHg     Is BP treated: No     HDL Cholesterol: 48 mg/dL     Total Cholesterol: 195 mg/dL Discussed calculated cholesterol risk.  Wants to continue diet and exercise.  Continue diet and exercise. Follow lipid panel.

## 2024-07-11 NOTE — Assessment & Plan Note (Signed)
 Overall handling things well. Continue effexor . Follow. Notify me if feels needs any further intervention. Follow.

## 2024-07-11 NOTE — Assessment & Plan Note (Signed)
 Continue cpap.

## 2024-08-23 ENCOUNTER — Ambulatory Visit
Admission: RE | Admit: 2024-08-23 | Discharge: 2024-08-23 | Disposition: A | Source: Ambulatory Visit | Attending: Internal Medicine | Admitting: Internal Medicine

## 2024-08-23 ENCOUNTER — Ambulatory Visit
Admission: RE | Admit: 2024-08-23 | Discharge: 2024-08-23 | Disposition: A | Discharge status: Home / Self Care | Source: Ambulatory Visit | Attending: Internal Medicine | Admitting: Internal Medicine

## 2024-08-23 DIAGNOSIS — R928 Other abnormal and inconclusive findings on diagnostic imaging of breast: Secondary | ICD-10-CM

## 2024-12-01 ENCOUNTER — Telehealth: Payer: Self-pay

## 2024-12-01 MED ORDER — VENLAFAXINE HCL ER 75 MG PO CP24: 75.0000 mg | ORAL_CAPSULE | Freq: Every day | ORAL | 0 refills | Status: AC

## 2024-12-01 NOTE — Addendum Note (Signed)
 Addended by: Baby Gieger on: 12/01/2024 12:48 PM   Modules accepted: Orders

## 2024-12-01 NOTE — Telephone Encounter (Signed)
 Refill sent

## 2024-12-01 NOTE — Telephone Encounter (Signed)
 Copied from CRM #[id number]. Topic: Clinical - Medication Refill >> Dec 01, 2024  9:43 AM Amber H wrote: Medication: venlafaxine  XR (EFFEXOR -XR) 75 MG 24 hr capsule  Has the patient contacted their pharmacy? No, no refills  (Agent: If no, request that the patient contact the pharmacy for the refill. If patient does not wish to contact the pharmacy document the reason why and proceed with request.) (Agent: If yes, when and what did the pharmacy advise?)  This is the patient's preferred pharmacy:  [facility] - [place], [place] - [address] [place] [address] Phone: [phone] Fax: [phone]  Is this the correct pharmacy for this prescription? Yes If no, delete pharmacy and type the correct one.   Has the prescription been filled recently? Yes  Is the patient out of the medication? No, has 10 days left   Has the patient been seen for an appointment in the last year OR does the patient have an upcoming appointment? Yes, 07/05/2024  Can we respond through MyChart? Yes  Agent: Please be advised that Rx refills may take up to 3 business days. We ask that you follow-up with your pharmacy.

## 2025-01-05 ENCOUNTER — Encounter: Admitting: Internal Medicine

## 2025-04-24 ENCOUNTER — Ambulatory Visit
